# Patient Record
Sex: Female | Born: 1950
Health system: Southern US, Community
[De-identification: ages and names within clinical notes are randomized; demographics above are authoritative.]

## PROBLEM LIST (undated history)

## (undated) DIAGNOSIS — K5790 Diverticulosis of intestine, part unspecified, without perforation or abscess without bleeding: Secondary | ICD-10-CM

## (undated) DIAGNOSIS — T7840XA Allergy, unspecified, initial encounter: Secondary | ICD-10-CM

## (undated) DIAGNOSIS — Z01419 Encounter for gynecological examination (general) (routine) without abnormal findings: Secondary | ICD-10-CM

## (undated) DIAGNOSIS — H5210 Myopia, unspecified eye: Secondary | ICD-10-CM

## (undated) DIAGNOSIS — C801 Malignant (primary) neoplasm, unspecified: Secondary | ICD-10-CM

## (undated) DIAGNOSIS — Z9889 Other specified postprocedural states: Secondary | ICD-10-CM

## (undated) DIAGNOSIS — E785 Hyperlipidemia, unspecified: Secondary | ICD-10-CM

## (undated) DIAGNOSIS — M810 Age-related osteoporosis without current pathological fracture: Secondary | ICD-10-CM

## (undated) DIAGNOSIS — D649 Anemia, unspecified: Secondary | ICD-10-CM

## (undated) DIAGNOSIS — M1990 Unspecified osteoarthritis, unspecified site: Secondary | ICD-10-CM

## (undated) DIAGNOSIS — A6000 Herpesviral infection of urogenital system, unspecified: Secondary | ICD-10-CM

## (undated) DIAGNOSIS — A925 Zika virus disease: Secondary | ICD-10-CM

## (undated) DIAGNOSIS — G43909 Migraine, unspecified, not intractable, without status migrainosus: Secondary | ICD-10-CM

## (undated) DIAGNOSIS — K219 Gastro-esophageal reflux disease without esophagitis: Secondary | ICD-10-CM

## (undated) DIAGNOSIS — Z1509 Genetic susceptibility to other malignant neoplasm: Secondary | ICD-10-CM

## (undated) DIAGNOSIS — Z85820 Personal history of malignant melanoma of skin: Secondary | ICD-10-CM

## (undated) DIAGNOSIS — I10 Essential (primary) hypertension: Secondary | ICD-10-CM

## (undated) DIAGNOSIS — G4700 Insomnia, unspecified: Secondary | ICD-10-CM

## (undated) DIAGNOSIS — F329 Major depressive disorder, single episode, unspecified: Secondary | ICD-10-CM

## (undated) DIAGNOSIS — S0590XA Unspecified injury of unspecified eye and orbit, initial encounter: Secondary | ICD-10-CM

## (undated) DIAGNOSIS — Z9289 Personal history of other medical treatment: Secondary | ICD-10-CM

## (undated) DIAGNOSIS — F419 Anxiety disorder, unspecified: Secondary | ICD-10-CM

## (undated) DIAGNOSIS — D126 Benign neoplasm of colon, unspecified: Secondary | ICD-10-CM

## (undated) DIAGNOSIS — R112 Nausea with vomiting, unspecified: Secondary | ICD-10-CM

## (undated) DIAGNOSIS — I700 Atherosclerosis of aorta: Secondary | ICD-10-CM

## (undated) DIAGNOSIS — F32A Depression, unspecified: Secondary | ICD-10-CM

## (undated) HISTORY — DX: Essential (primary) hypertension: I10

## (undated) HISTORY — DX: Major depressive disorder, single episode, unspecified: F32.9

## (undated) HISTORY — PX: VARICOSE VEIN SURGERY: SHX832

## (undated) HISTORY — DX: Benign neoplasm of colon, unspecified: D12.6

## (undated) HISTORY — DX: Depression, unspecified: F32.A

## (undated) HISTORY — PX: TONSILLECTOMY AND ADENOIDECTOMY: SUR1326

## (undated) HISTORY — DX: Migraine, unspecified, not intractable, without status migrainosus: G43.909

## (undated) HISTORY — DX: Herpesviral infection of urogenital system, unspecified: A60.00

## (undated) HISTORY — DX: Age-related osteoporosis without current pathological fracture: M81.0

## (undated) HISTORY — DX: Personal history of other medical treatment: Z92.89

## (undated) HISTORY — DX: Malignant (primary) neoplasm, unspecified: C80.1

## (undated) HISTORY — DX: Hyperlipidemia, unspecified: E78.5

## (undated) HISTORY — PX: POLYPECTOMY: SHX149

## (undated) HISTORY — DX: Anxiety disorder, unspecified: F41.9

## (undated) HISTORY — DX: Encounter for gynecological examination (general) (routine) without abnormal findings: Z01.419

## (undated) HISTORY — DX: Unspecified injury of unspecified eye and orbit, initial encounter: S05.90XA

## (undated) HISTORY — DX: Anemia, unspecified: D64.9

## (undated) HISTORY — DX: Atherosclerosis of aorta: I70.0

## (undated) HISTORY — DX: Personal history of malignant melanoma of skin: Z85.820

## (undated) HISTORY — DX: Zika virus disease: A92.5

## (undated) HISTORY — DX: Diverticulosis of intestine, part unspecified, without perforation or abscess without bleeding: K57.90

## (undated) HISTORY — DX: Nausea with vomiting, unspecified: R11.2

## (undated) HISTORY — DX: Genetic susceptibility to other malignant neoplasm: Z15.09

## (undated) HISTORY — DX: Myopia, unspecified eye: H52.10

## (undated) HISTORY — DX: Allergy, unspecified, initial encounter: T78.40XA

## (undated) HISTORY — DX: Gastro-esophageal reflux disease without esophagitis: K21.9

## (undated) HISTORY — DX: Insomnia, unspecified: G47.00

## (undated) HISTORY — DX: Other specified postprocedural states: Z98.890

## (undated) HISTORY — DX: Unspecified osteoarthritis, unspecified site: M19.90

---

## 1983-05-12 HISTORY — PX: TUBAL LIGATION: SHX77

## 1996-05-11 HISTORY — PX: VAGINAL HYSTERECTOMY: SUR661

## 1997-10-03 ENCOUNTER — Other Ambulatory Visit: Admission: RE | Admit: 1997-10-03 | Discharge: 1997-10-03 | Payer: Self-pay | Admitting: Obstetrics and Gynecology

## 1998-10-04 ENCOUNTER — Other Ambulatory Visit: Admission: RE | Admit: 1998-10-04 | Discharge: 1998-10-04 | Payer: Self-pay | Admitting: Obstetrics and Gynecology

## 1999-06-21 ENCOUNTER — Emergency Department (HOSPITAL_COMMUNITY): Admission: EM | Admit: 1999-06-21 | Discharge: 1999-06-21 | Payer: Self-pay | Admitting: Emergency Medicine

## 2000-01-13 ENCOUNTER — Other Ambulatory Visit: Admission: RE | Admit: 2000-01-13 | Discharge: 2000-01-13 | Payer: Self-pay | Admitting: Obstetrics and Gynecology

## 2000-03-22 ENCOUNTER — Encounter: Payer: Self-pay | Admitting: Family Medicine

## 2000-03-22 ENCOUNTER — Encounter: Admission: RE | Admit: 2000-03-22 | Discharge: 2000-03-22 | Payer: Self-pay | Admitting: Family Medicine

## 2000-06-04 ENCOUNTER — Emergency Department (HOSPITAL_COMMUNITY): Admission: EM | Admit: 2000-06-04 | Discharge: 2000-06-04 | Payer: Self-pay

## 2000-11-02 ENCOUNTER — Encounter: Payer: Self-pay | Admitting: Family Medicine

## 2001-02-05 ENCOUNTER — Emergency Department (HOSPITAL_COMMUNITY): Admission: EM | Admit: 2001-02-05 | Discharge: 2001-02-05 | Payer: Self-pay | Admitting: Emergency Medicine

## 2001-02-09 ENCOUNTER — Other Ambulatory Visit: Admission: RE | Admit: 2001-02-09 | Discharge: 2001-02-09 | Payer: Self-pay | Admitting: Obstetrics and Gynecology

## 2001-03-22 ENCOUNTER — Encounter: Payer: Self-pay | Admitting: Orthopaedic Surgery

## 2001-03-22 ENCOUNTER — Encounter: Admission: RE | Admit: 2001-03-22 | Discharge: 2001-03-22 | Payer: Self-pay | Admitting: Orthopaedic Surgery

## 2002-03-22 ENCOUNTER — Other Ambulatory Visit: Admission: RE | Admit: 2002-03-22 | Discharge: 2002-03-22 | Payer: Self-pay | Admitting: Obstetrics and Gynecology

## 2003-03-28 ENCOUNTER — Encounter: Payer: Self-pay | Admitting: Family Medicine

## 2003-03-28 ENCOUNTER — Other Ambulatory Visit: Admission: RE | Admit: 2003-03-28 | Discharge: 2003-03-28 | Payer: Self-pay | Admitting: Obstetrics and Gynecology

## 2003-05-24 ENCOUNTER — Encounter: Payer: Self-pay | Admitting: Family Medicine

## 2004-04-22 ENCOUNTER — Other Ambulatory Visit: Admission: RE | Admit: 2004-04-22 | Discharge: 2004-04-22 | Payer: Self-pay | Admitting: Obstetrics and Gynecology

## 2004-10-22 ENCOUNTER — Ambulatory Visit (HOSPITAL_COMMUNITY): Admission: RE | Admit: 2004-10-22 | Discharge: 2004-10-22 | Payer: Self-pay | Admitting: Obstetrics and Gynecology

## 2004-10-22 ENCOUNTER — Encounter: Payer: Self-pay | Admitting: Family Medicine

## 2004-10-31 ENCOUNTER — Encounter: Admission: RE | Admit: 2004-10-31 | Discharge: 2004-10-31 | Payer: Self-pay | Admitting: Family Medicine

## 2005-04-06 ENCOUNTER — Ambulatory Visit: Payer: Self-pay | Admitting: Internal Medicine

## 2005-04-07 ENCOUNTER — Ambulatory Visit: Payer: Self-pay | Admitting: *Deleted

## 2005-04-10 ENCOUNTER — Ambulatory Visit: Payer: Self-pay | Admitting: Internal Medicine

## 2005-04-17 ENCOUNTER — Encounter: Payer: Self-pay | Admitting: Family Medicine

## 2005-04-17 ENCOUNTER — Ambulatory Visit: Payer: Self-pay | Admitting: Internal Medicine

## 2005-05-13 ENCOUNTER — Other Ambulatory Visit: Admission: RE | Admit: 2005-05-13 | Discharge: 2005-05-13 | Payer: Self-pay | Admitting: Obstetrics and Gynecology

## 2005-07-09 ENCOUNTER — Emergency Department (HOSPITAL_COMMUNITY): Admission: EM | Admit: 2005-07-09 | Discharge: 2005-07-09 | Payer: Self-pay | Admitting: Emergency Medicine

## 2006-06-22 ENCOUNTER — Ambulatory Visit: Payer: Self-pay | Admitting: Internal Medicine

## 2006-07-08 ENCOUNTER — Encounter: Payer: Self-pay | Admitting: Family Medicine

## 2006-07-08 ENCOUNTER — Ambulatory Visit: Payer: Self-pay | Admitting: Internal Medicine

## 2007-07-05 ENCOUNTER — Emergency Department (HOSPITAL_COMMUNITY): Admission: EM | Admit: 2007-07-05 | Discharge: 2007-07-05 | Payer: Self-pay | Admitting: Family Medicine

## 2007-07-14 ENCOUNTER — Encounter: Admission: RE | Admit: 2007-07-14 | Discharge: 2007-07-14 | Payer: Self-pay | Admitting: Family Medicine

## 2007-12-13 ENCOUNTER — Telehealth: Payer: Self-pay | Admitting: Internal Medicine

## 2008-08-20 ENCOUNTER — Encounter (INDEPENDENT_AMBULATORY_CARE_PROVIDER_SITE_OTHER): Payer: Self-pay | Admitting: *Deleted

## 2008-08-21 ENCOUNTER — Ambulatory Visit: Payer: Self-pay | Admitting: Family Medicine

## 2008-08-21 DIAGNOSIS — J309 Allergic rhinitis, unspecified: Secondary | ICD-10-CM | POA: Insufficient documentation

## 2008-08-21 DIAGNOSIS — R51 Headache: Secondary | ICD-10-CM | POA: Insufficient documentation

## 2008-08-21 DIAGNOSIS — R519 Headache, unspecified: Secondary | ICD-10-CM | POA: Insufficient documentation

## 2008-08-21 DIAGNOSIS — E785 Hyperlipidemia, unspecified: Secondary | ICD-10-CM

## 2008-08-21 DIAGNOSIS — J45909 Unspecified asthma, uncomplicated: Secondary | ICD-10-CM | POA: Insufficient documentation

## 2008-08-21 DIAGNOSIS — R030 Elevated blood-pressure reading, without diagnosis of hypertension: Secondary | ICD-10-CM | POA: Insufficient documentation

## 2008-08-22 ENCOUNTER — Encounter (INDEPENDENT_AMBULATORY_CARE_PROVIDER_SITE_OTHER): Payer: Self-pay | Admitting: *Deleted

## 2008-09-27 ENCOUNTER — Encounter (INDEPENDENT_AMBULATORY_CARE_PROVIDER_SITE_OTHER): Payer: Self-pay | Admitting: *Deleted

## 2008-10-03 ENCOUNTER — Ambulatory Visit: Payer: Self-pay | Admitting: Family Medicine

## 2008-11-07 ENCOUNTER — Encounter: Payer: Self-pay | Admitting: Family Medicine

## 2009-01-18 ENCOUNTER — Ambulatory Visit: Payer: Self-pay | Admitting: Family Medicine

## 2009-01-18 DIAGNOSIS — J019 Acute sinusitis, unspecified: Secondary | ICD-10-CM

## 2009-03-01 ENCOUNTER — Ambulatory Visit: Payer: Self-pay | Admitting: Family Medicine

## 2009-03-01 DIAGNOSIS — K589 Irritable bowel syndrome without diarrhea: Secondary | ICD-10-CM

## 2009-03-05 ENCOUNTER — Telehealth (INDEPENDENT_AMBULATORY_CARE_PROVIDER_SITE_OTHER): Payer: Self-pay | Admitting: *Deleted

## 2009-03-13 ENCOUNTER — Encounter: Payer: Self-pay | Admitting: Internal Medicine

## 2009-03-13 ENCOUNTER — Encounter (INDEPENDENT_AMBULATORY_CARE_PROVIDER_SITE_OTHER): Payer: Self-pay | Admitting: *Deleted

## 2009-03-13 LAB — CONVERTED CEMR LAB
ALT: 18 units/L
AST: 27 units/L
Alkaline Phosphatase: 61 units/L
CO2, serum: 26 mmol/L
Chloride, Serum: 105 mmol/L
Cholesterol: 246 mg/dL
Glucose, Bld: 82 mg/dL
HCT: 38.1 %
Hemoglobin: 12.6 g/dL
Platelets: 216 10*3/uL
Potassium, serum: 3.7 mmol/L
Total Bilirubin: 0.4 mg/dL

## 2009-04-11 ENCOUNTER — Encounter: Payer: Self-pay | Admitting: Internal Medicine

## 2009-04-22 ENCOUNTER — Encounter: Payer: Self-pay | Admitting: Internal Medicine

## 2009-04-23 ENCOUNTER — Encounter: Payer: Self-pay | Admitting: Internal Medicine

## 2009-04-23 ENCOUNTER — Encounter (INDEPENDENT_AMBULATORY_CARE_PROVIDER_SITE_OTHER): Payer: Self-pay | Admitting: *Deleted

## 2009-04-23 LAB — CONVERTED CEMR LAB
CO2, serum: 26 mmol/L
Chloride, Serum: 107 mmol/L
Creatinine, Ser: 0.74 mg/dL
HCT: 38.5 %
Potassium, serum: 3.9 mmol/L
WBC, blood: 3.26 10*3/uL

## 2009-04-24 ENCOUNTER — Encounter (INDEPENDENT_AMBULATORY_CARE_PROVIDER_SITE_OTHER): Payer: Self-pay | Admitting: *Deleted

## 2009-05-20 ENCOUNTER — Encounter (INDEPENDENT_AMBULATORY_CARE_PROVIDER_SITE_OTHER): Payer: Self-pay | Admitting: *Deleted

## 2009-08-09 ENCOUNTER — Encounter (INDEPENDENT_AMBULATORY_CARE_PROVIDER_SITE_OTHER): Payer: Self-pay | Admitting: *Deleted

## 2009-09-24 ENCOUNTER — Encounter: Payer: Self-pay | Admitting: Family Medicine

## 2009-10-25 ENCOUNTER — Telehealth (INDEPENDENT_AMBULATORY_CARE_PROVIDER_SITE_OTHER): Payer: Self-pay | Admitting: *Deleted

## 2009-11-20 ENCOUNTER — Ambulatory Visit: Payer: Self-pay | Admitting: Family Medicine

## 2010-01-09 ENCOUNTER — Ambulatory Visit (HOSPITAL_COMMUNITY): Admission: RE | Admit: 2010-01-09 | Discharge: 2010-01-09 | Payer: Self-pay | Admitting: Orthopaedic Surgery

## 2010-01-20 ENCOUNTER — Telehealth (INDEPENDENT_AMBULATORY_CARE_PROVIDER_SITE_OTHER): Payer: Self-pay | Admitting: *Deleted

## 2010-02-14 ENCOUNTER — Telehealth (INDEPENDENT_AMBULATORY_CARE_PROVIDER_SITE_OTHER): Payer: Self-pay | Admitting: *Deleted

## 2010-02-19 ENCOUNTER — Ambulatory Visit: Payer: Self-pay | Admitting: Family Medicine

## 2010-02-19 DIAGNOSIS — G4700 Insomnia, unspecified: Secondary | ICD-10-CM

## 2010-02-19 DIAGNOSIS — F411 Generalized anxiety disorder: Secondary | ICD-10-CM | POA: Insufficient documentation

## 2010-04-04 ENCOUNTER — Ambulatory Visit: Payer: Self-pay | Admitting: Family Medicine

## 2010-04-11 ENCOUNTER — Ambulatory Visit (HOSPITAL_COMMUNITY)
Admission: RE | Admit: 2010-04-11 | Discharge: 2010-04-11 | Payer: Self-pay | Source: Home / Self Care | Admitting: Orthopaedic Surgery

## 2010-04-18 ENCOUNTER — Telehealth (INDEPENDENT_AMBULATORY_CARE_PROVIDER_SITE_OTHER): Payer: Self-pay | Admitting: *Deleted

## 2010-04-21 ENCOUNTER — Encounter: Payer: Self-pay | Admitting: Family Medicine

## 2010-05-11 HISTORY — PX: ROTATOR CUFF REPAIR: SHX139

## 2010-06-08 LAB — CONVERTED CEMR LAB
Albumin: 3.8 g/dL (ref 3.5–5.2)
Alkaline Phosphatase: 56 units/L (ref 39–117)
BUN: 12 mg/dL (ref 6–23)
Basophils Relative: 0.9 % (ref 0.0–3.0)
Bilirubin, Direct: 0.1 mg/dL (ref 0.0–0.3)
CO2: 28 meq/L (ref 19–32)
Chloride: 110 meq/L (ref 96–112)
Creatinine, Ser: 0.8 mg/dL (ref 0.4–1.2)
Eosinophils Absolute: 0 10*3/uL (ref 0.0–0.7)
Eosinophils Relative: 1.6 % (ref 0.0–5.0)
HCT: 34.4 % — ABNORMAL LOW (ref 36.0–46.0)
HDL: 56.8 mg/dL (ref >39.00)
Lymphs Abs: 1 10*3/uL (ref 0.7–4.0)
MCHC: 35.3 g/dL (ref 30.0–36.0)
MCV: 92.4 fL (ref 78.0–100.0)
Monocytes Absolute: 0.2 10*3/uL (ref 0.1–1.0)
Neutrophils Relative %: 50 % (ref 43.0–77.0)
Potassium: 3.7 meq/L (ref 3.5–5.1)
RBC: 3.73 M/uL — ABNORMAL LOW (ref 3.87–5.11)
Total CHOL/HDL Ratio: 4
Total Protein: 6.3 g/dL (ref 6.0–8.3)
VLDL: 14.6 mg/dL (ref 0.0–40.0)
WBC: 2.7 10*3/uL — ABNORMAL LOW (ref 4.5–10.5)

## 2010-06-12 NOTE — Progress Notes (Signed)
Summary: Wants sleep meds  Phone Note Call from Patient   Caller: Patient Call For: Neena Rhymes MD Details for Reason: Wants sleep Aid Summary of Call: spk with pt and she requested to have Ambien called into the pharmacy, adv she will need and appt, per pt unable to come in until next week due to daughter's wedding, scheduled appt 02/19/10 @ 2:40 with Dr.Tabori. Pt agreed to appt. call ended. Almeta Monas CMA Duncan Dull)  February 14, 2010 11:40 AM  Initial call taken by: Almeta Monas CMA Duncan Dull),  February 14, 2010 11:41 AM

## 2010-06-12 NOTE — Letter (Signed)
Summary: Colonoscopy Letter  [facility]  [address] [place], [place] [address]   Phone: [phone]  Fax: [phone]      August 09, 2009 MRN: [id number]   [facility] [address] [place], [place]  [address]   Dear Ms. Bergen,   According to your medical record, it is time for you to schedule a Colonoscopy. The American Cancer Society recommends this procedure as a method to detect early colon cancer. Patients with a family history of colon cancer, or a personal history of colon polyps or inflammatory bowel disease are at increased risk.  This letter has beeen generated based on the recommendations made at the time of your procedure. If you feel that in your particular situation this may no longer apply, please contact our office.  Please call our office at [phone] to schedule this appointment or to update your records at your earliest convenience.  Thank you for cooperating with [place] to provide you with the very best care possible.   Sincerely,  Iva Boop, M.D.  [facility] [phone]

## 2010-06-12 NOTE — Letter (Signed)
Summary: [facility]  [facility]   Imported By: Lanelle Bal 10/16/2009 11:10:26  _____________________________________________________________________  External Attachment:    Type:   Image     Comment:   External Document

## 2010-06-12 NOTE — Progress Notes (Signed)
Summary: acyclovir refill   Phone Note Refill Request Call back at [phone] Message from:  Pharmacy on October 25, 2009 10:49 AM  Refills Requested: Medication #1:  ACYCLOVIR 400 MG TAB #21 TAKE ONE TABLET THREE TIMES DAILY FOR SEVEN DAYS   Dosage confirmed as above?Dosage Confirmed   Supply Requested: 1 month [facility] PATIENT IS REQUESTING FOR THIS FROM DR. TABORI. ORIGINAL RX WAS FROM DR BLOMGREN-SHE HAS A BAD COLD SORE.  Next Appointment Scheduled: NONE Initial call taken by: Lavell Islam,  October 25, 2009 10:51 AM    New/Updated Medications: ACYCLOVIR 400 MG TABS (ACYCLOVIR) take one tablet three times a day as needed for outbreaks Prescriptions: ACYCLOVIR 400 MG TABS (ACYCLOVIR) take one tablet three times a day as needed for outbreaks  #30 x 1   Entered by:   Doristine Devoid   Authorized by:   Neena Rhymes MD   Signed by:   Doristine Devoid on 10/25/2009   Method used:   Electronically to        [facility]* (retail)       [address]       [place], [place]  [id number]       Ph: [phone] or [phone]       Fax: [phone]   RxID:   [id number]

## 2010-06-12 NOTE — Miscellaneous (Signed)
Summary: colonoscopy results-- repeat in 2 years   scanned into emr [facility]  June 18, 2009 4:46 PM  Clinical Lists Changes  Observations: Added new observation of COLONRECACT: Repeat colonoscopy in 2 years.   (04/11/2009 16:45) Added new observation of COLONOSCOPY: Results: Polyp.  Results: Diverticulosis.       Location:  [facility].  Dr. Kinnie Scales  (04/11/2009 16:45)      Colonoscopy  Procedure date:  04/11/2009  Findings:      Results: Polyp.  Results: Diverticulosis.       Location:  [facility].  Dr. Kinnie Scales   Comments:      Repeat colonoscopy in 2 years.      Colonoscopy  Procedure date:  04/11/2009  Findings:      Results: Polyp.  Results: Diverticulosis.       Location:  [facility].  Dr. Kinnie Scales   Comments:      Repeat colonoscopy in 2 years.

## 2010-06-12 NOTE — Assessment & Plan Note (Signed)
Summary: Difficulty sleeping/KP   Vital Signs:  Patient profile:   60 year old female Height:      67 inches Weight:      163 pounds BMI:     25.62 Pulse rate:   76 / minute BP sitting:   110 / 80  (left arm)  Vitals Entered By: Doristine Devoid CMA (February 19, 2010 4:15 PM)  History of Present Illness: 60 yo woman here today for insomnia.  'will sleep ok from 11-3 and then i'm up'.  exercising regularly.  no difficulty falling asleep, trouble staying asleep.  sxs started a 'couple of months ago'.  denies recent stressors.  admits to increased anxiety- 'i don't like to be around people'.  withdrawing from activities outside of work- 'i just want to be by myself'  strained relationship w/ daughter.  'don't talk to me about prozac or anything like that'.  has tried OTC sleep aids, Tylenol or Advil PM- w/ some relief.    Allergies (verified): 1)  ! 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 Alert and cooperative with normal attention span and concentration. No apparent delusions, illusions, hallucinations   Impression & Recommendations:  Problem # 1:  INSOMNIA-SLEEP DISORDER-UNSPEC (ICD-780.52) Assessment New most likely related to pt's anxiety.  will start Citalopram for anxiety and Ambien to bridge the gap until SSRI takes effect.  will follow closely.  total time spent w/ pt 29 minutes, >50% spent counseling. Her updated medication list for this problem includes:    Zolpidem Tartrate 5 Mg Tabs (Zolpidem tartrate) .Marland Kitchen... 1-2 tabs at bedtime as needed for sleep  Problem # 2:  ANXIETY STATE, UNSPECIFIED (ICD-300.00) Assessment: New see above. Her updated medication list for this problem includes:    Citalopram  Hydrobromide 20 Mg Tabs (Citalopram hydrobromide) .Marland Kitchen... Take one tablet by mouth daily  Complete Medication List: 1)  Zyrtec Allergy 10 Mg Tabs (Cetirizine hcl) .... Take one tablet daily 2)  Estrace  .... Use daily 3)  Multivitamins Caps (Multiple vitamin) .... Take one tablet daily 4)  Acyclovir 400 Mg Tabs (Acyclovir) .... Take one tablet three times a day as needed for outbreaks 5)  Verapamil Hcl Cr 240 Mg Cr-tabs (Verapamil hcl) .... Take one tablet daily 6)  Citalopram Hydrobromide 20 Mg Tabs (Citalopram hydrobromide) .... Take one tablet by mouth daily 7)  Zolpidem Tartrate 5 Mg Tabs (Zolpidem tartrate) .Marland Kitchen.. 1-2 tabs at bedtime as needed for sleep  Patient Instructions: 1)  Please schedule a follow-up appointment in 1 month to recheck meds. 2)  Start the Citalopram daily as directed 3)  Use the Ambien (Zolpidem) 3-4x/week as needed for sleep 4)  Hang in there!!  Prescriptions: ZOLPIDEM TARTRATE 5 MG TABS (ZOLPIDEM TARTRATE) 1-2 tabs at bedtime as needed for sleep  #30 x 1   Entered and Authorized by:   Neena Rhymes MD   Signed by:   Neena Rhymes MD on 02/19/2010   Method used:   Print then Give to Patient   RxID:   [id number] CITALOPRAM HYDROBROMIDE 20 MG TABS (CITALOPRAM HYDROBROMIDE) take one tablet by mouth daily  #30 x 3   Entered and Authorized by:   Neena Rhymes MD   Signed by:   Neena Rhymes MD on 02/19/2010  Method used:   Electronically to        [facility]* (retail)       [address]       [place], [place]  [id number]       Ph: [phone] or [phone]       Fax: [phone]   RxID:   [id number]

## 2010-06-12 NOTE — Letter (Signed)
Summary: [facility]  [facility]   Imported By: Lanelle Bal 10/08/2009 13:23:51  _____________________________________________________________________  External Attachment:    Type:   Image     Comment:   External Document

## 2010-06-12 NOTE — Assessment & Plan Note (Signed)
Summary: sinus problems//kn   Vital Signs:  Patient profile:   60 year old female Weight:      161 pounds Temp:     97.9 degrees F oral BP sitting:   130 / 80  (left arm)  Vitals Entered By: Doristine Devoid CMA (November 20, 2009 9:11 AM) CC: sinus congestion and cough x2 weeks    History of Present Illness: 59 yo woman here today for sinus congestion and cough.  sxs started 2 weeks ago.  + HAs.  cough is wet but nonproductive.  + ear pain, R>L.  mild facial pain.  no fevers.  + sick contacts at work.  elevated BP.  started on Verapamil by neurologist.  neuro asked for EKG.  Problems Prior to Update: 1)  Encounter For Long-term Use of Other Medications  (ICD-V58.69) 2)  Physical Examination  (ICD-V70.0) 3)  Irritable Bowel Syndrome  (ICD-564.1) 4)  Sinusitis - Acute-nos  (ICD-461.9) 5)  Hyperlipidemia  (ICD-272.4) 6)  Elevated Bp w/o Hypertension  (ICD-796.2) 7)  Rhinitis  (ICD-477.9) 8)  Headache, Chronic  (ICD-784.0) 9)  Asthma  (ICD-493.90)  Current Medications (verified): 1)  Zyrtec Allergy 10 Mg Tabs (Cetirizine Hcl) .... Take One Tablet Daily 2)  Estrace .... Use Daily 3)  Multivitamins  Caps (Multiple Vitamin) .... Take One Tablet Daily 4)  Acyclovir 400 Mg Tabs (Acyclovir) .... Take One Tablet Three Times A Day As Needed For Outbreaks 5)  Verapamil Hcl Cr 240 Mg Cr-Tabs (Verapamil Hcl) .... Take One Tablet Daily 6)  Amoxicillin 500 Mg Tabs (Amoxicillin) .... 2 Tabs By Mouth Two Times A Day X10 Days  Allergies (verified): 1)  ! Sulfa  Review of Systems      See HPI  Physical Exam  General:  Well-developed,well-nourished,in no acute distress; alert,appropriate and cooperative throughout examination Head:  Normocephalic and atraumatic without obvious abnormalities. No apparent alopecia or balding.  mild TTP over sinuses Eyes:  no injxn or inflammation Ears:  External ear exam shows no significant lesions or deformities.  Otoscopic examination reveals clear canals,  tympanic membranes are intact bilaterally without bulging, retraction, inflammation or discharge. Hearing is grossly normal bilaterally. Nose:  + congestion Mouth:  + PND, otherwise WNL Neck:  No deformities, masses, or tenderness noted. Lungs:  Normal respiratory effort, chest expands symmetrically. Lungs are clear to auscultation, no crackles or wheezes.  + wet, hacking cough Heart:  Normal rate and regular rhythm. S1 and S2 normal without gallop, murmur, click, rub or other extra sounds.   Impression & Recommendations:  Problem # 1:  SINUSITIS - ACUTE-NOS (ICD-461.9) Assessment Unchanged pt's sxs and PE consistent w/ sinusitis/bronchitis combo.  start high dose amox to penetrate sinuses.  reviewed supportive care and red flags that should prompt return.  Pt expresses understanding and is in agreement w/ this plan. The following medications were removed from the medication list:    Fluticasone Propionate 50 Mcg/act Susp (Fluticasone propionate) .Marland Kitchen... 2 sprays each nostril once daily Her updated medication list for this problem includes:    Amoxicillin 500 Mg Tabs (Amoxicillin) .Marland Kitchen... 2 tabs by mouth two times a day x10 days  Problem # 2:  ELEVATED BP W/O HYPERTENSION (ICD-796.2) Assessment: Unchanged pt recently started on Verapamil by neurology for elevated BP.  requesting EKG.  done- nothing acute.  will monitor BP and adjust meds as needed. Her updated medication list for this problem includes:    Verapamil Hcl Cr 240 Mg Cr-tabs (Verapamil hcl) .Marland Kitchen... Take one tablet daily  Complete Medication List: 1)  Zyrtec Allergy 10 Mg Tabs (Cetirizine hcl) .... Take one tablet daily 2)  Estrace  .... Use daily 3)  Multivitamins Caps (Multiple vitamin) .... Take one tablet daily 4)  Acyclovir 400 Mg Tabs (Acyclovir) .... Take one tablet three times a day as needed for outbreaks 5)  Verapamil Hcl Cr 240 Mg Cr-tabs (Verapamil hcl) .... Take one tablet daily 6)  Amoxicillin 500 Mg Tabs  (Amoxicillin) .... 2 tabs by mouth two times a day x10 days  Other Orders: EKG w/ Interpretation (93000)  Patient Instructions: 1)  Follow up in 1 month to recheck BP 2)  Start the Amoxicillin as directed- take w/ food to avoid upset stomach 3)  Start the Mucinex to thin your congestion 4)  Tylenol/Ibuprofen as needed for fever 5)  Call with any questions or concerns 6)  Enjoy your grandbaby!!! Prescriptions: AMOXICILLIN 500 MG TABS (AMOXICILLIN) 2 tabs by mouth two times a day x10 days  #40 x 0   Entered and Authorized by:   Neena Rhymes MD   Signed by:   Neena Rhymes MD on 11/20/2009   Method used:   Electronically to        [facility] (retail)       [address]       [place], [place]  [address]       Ph: [phone] or [phone]       Fax: [phone]   RxID:   [id number]

## 2010-06-12 NOTE — Letter (Signed)
Summary: [facility]  [facility]   Imported By: Lanelle Bal 05/02/2010 12:48:30  _____________________________________________________________________  External Attachment:    Type:   Image     Comment:   External Document

## 2010-06-12 NOTE — Procedures (Signed)
Summary: Colonoscopy/[facility]  Colonoscopy/[facility]   Imported By: Lanelle Bal 06/20/2009 15:57:17  _____________________________________________________________________  External Attachment:    Type:   Image     Comment:   External Document

## 2010-06-12 NOTE — Progress Notes (Signed)
Summary: zolpidem refill   Phone Note Refill Request Call back at [phone] Message from:  Pharmacy on April 18, 2010 7:48 AM  Refills Requested: Medication #1:  ZOLPIDEM TARTRATE 5 MG TABS 1-2 tabs at bedtime as needed for sleep.   Dosage confirmed as above?Dosage Confirmed   Supply Requested: 1 month   [facility] [address]   Next Appointment Scheduled: none Initial call taken by: Harold Barban,  April 18, 2010 7:48 AM    Prescriptions: ZOLPIDEM TARTRATE 5 MG TABS (ZOLPIDEM TARTRATE) 1-2 tabs at bedtime as needed for sleep  #30 x 1   Entered by:   Doristine Devoid CMA   Authorized by:   Neena Rhymes MD   Signed by:   Doristine Devoid CMA on 04/18/2010   Method used:   Printed then faxed to ...       [facility] [address] [phone]* (retail)       [address]       [place], [place]  [address]       Ph: [phone] or [phone]       Fax: [phone]   RxID:   [id number]

## 2010-06-12 NOTE — Miscellaneous (Signed)
  Clinical Lists Changes  Observations: Added new observation of ALBUMIN: 4.6 g/dL (62/13/0865 78:46) Added new observation of CALCIUM: 9.7 mg/dL (96/29/5284 13:24) Added new observation of GLUCOSE SER: 90 mg/dL (40/02/2724 36:64) Added new observation of CREATININE: 0.74 mg/dL (40/34/7425 95:63) Added new observation of BUN: 20 mg/dL (87/56/4332 95:18) Added new observation of CO2 TOTAL: 26 mmol/L (04/23/2009 10:19) Added new observation of CHLORIDE: 107 mmol/L (04/23/2009 10:19) Added new observation of POTASSIUM: 3.9 mmol/L (04/23/2009 10:19) Added new observation of SODIUM: 144 mmol/L (04/23/2009 10:19) Added new observation of PLATELETS: 177 10*3/mm3 (04/23/2009 10:19) Added new observation of HCT: 38.5 % (04/23/2009 10:19) Added new observation of HGB: 12.9 g/dL (84/16/6063 01:60) Added new observation of RBC: 4.29 10*6/mm3 (04/23/2009 10:19) Added new observation of WBC: 3.26 10*3/mm3 (04/23/2009 10:19)

## 2010-06-12 NOTE — Progress Notes (Signed)
Summary: CHANGE IN GI MD.  Phone Note Outgoing Call Call back at Home Phone [phone]   Call placed by: Harlow Mares CMA Duncan Dull),  January 20, 2010 11:30 AM Call placed to: Patient Summary of Call: called to remind patient about her colonoscopy but now see in her cahrt thatshe has changed to Dr. Kinnie Scales, we will note the chage in practices in IDX Initial call taken by: Harlow Mares CMA Duncan Dull),  January 20, 2010 11:38 AM

## 2010-07-02 ENCOUNTER — Institutional Professional Consult (permissible substitution): Payer: Self-pay | Admitting: Family Medicine

## 2010-07-03 ENCOUNTER — Institutional Professional Consult (permissible substitution) (INDEPENDENT_AMBULATORY_CARE_PROVIDER_SITE_OTHER): Payer: BC Managed Care – PPO | Admitting: Family Medicine

## 2010-07-03 DIAGNOSIS — F411 Generalized anxiety disorder: Secondary | ICD-10-CM

## 2010-07-03 DIAGNOSIS — G4700 Insomnia, unspecified: Secondary | ICD-10-CM

## 2010-08-22 ENCOUNTER — Ambulatory Visit (INDEPENDENT_AMBULATORY_CARE_PROVIDER_SITE_OTHER): Payer: BC Managed Care – PPO | Admitting: Family Medicine

## 2010-08-22 DIAGNOSIS — J209 Acute bronchitis, unspecified: Secondary | ICD-10-CM

## 2010-08-25 ENCOUNTER — Other Ambulatory Visit: Payer: Self-pay | Admitting: Family Medicine

## 2010-08-25 ENCOUNTER — Ambulatory Visit
Admission: RE | Admit: 2010-08-25 | Discharge: 2010-08-25 | Disposition: A | Discharge status: Home / Self Care | Payer: BC Managed Care – PPO | Source: Ambulatory Visit | Attending: Family Medicine | Admitting: Family Medicine

## 2010-08-25 ENCOUNTER — Ambulatory Visit (INDEPENDENT_AMBULATORY_CARE_PROVIDER_SITE_OTHER): Payer: BC Managed Care – PPO | Admitting: Family Medicine

## 2010-08-25 DIAGNOSIS — R05 Cough: Secondary | ICD-10-CM

## 2010-08-25 DIAGNOSIS — J209 Acute bronchitis, unspecified: Secondary | ICD-10-CM

## 2010-08-25 DIAGNOSIS — R5383 Other fatigue: Secondary | ICD-10-CM

## 2010-10-07 ENCOUNTER — Telehealth: Payer: Self-pay | Admitting: Family Medicine

## 2010-10-07 NOTE — Telephone Encounter (Signed)
Pt. Chart is on Dr. Delford Field desk and will inform Dr. Lynelle Doctor of this in the morning.  CM, LPN

## 2010-10-08 ENCOUNTER — Encounter: Payer: Self-pay | Admitting: Family Medicine

## 2011-01-05 ENCOUNTER — Other Ambulatory Visit: Payer: Self-pay | Admitting: Family Medicine

## 2011-01-05 MED ORDER — ZOLPIDEM TARTRATE ER 6.25 MG PO TBCR: 6.2500 mg | EXTENDED_RELEASE_TABLET | Freq: Every evening | ORAL | Status: DC | PRN

## 2011-01-05 NOTE — Telephone Encounter (Signed)
Called in Ambien CR 6.25 #30qhs-prn 2 RF's per Dr.Knapp to [facility] @ [phone].

## 2011-01-05 NOTE — Telephone Encounter (Signed)
Please phone in refill.  Thanks.

## 2011-01-13 ENCOUNTER — Encounter: Payer: Self-pay | Admitting: Family Medicine

## 2011-01-16 ENCOUNTER — Encounter: Payer: Self-pay | Admitting: Medical

## 2011-01-16 ENCOUNTER — Other Ambulatory Visit: Payer: Self-pay

## 2011-01-16 ENCOUNTER — Ambulatory Visit (INDEPENDENT_AMBULATORY_CARE_PROVIDER_SITE_OTHER): Payer: BC Managed Care – PPO | Admitting: Medical

## 2011-01-16 DIAGNOSIS — R000 Tachycardia, unspecified: Secondary | ICD-10-CM

## 2011-01-16 DIAGNOSIS — F419 Anxiety disorder, unspecified: Secondary | ICD-10-CM

## 2011-01-16 DIAGNOSIS — R0989 Other specified symptoms and signs involving the circulatory and respiratory systems: Secondary | ICD-10-CM

## 2011-01-16 DIAGNOSIS — Z0000 Encounter for general adult medical examination without abnormal findings: Secondary | ICD-10-CM

## 2011-01-16 DIAGNOSIS — F411 Generalized anxiety disorder: Secondary | ICD-10-CM

## 2011-01-16 DIAGNOSIS — J45909 Unspecified asthma, uncomplicated: Secondary | ICD-10-CM

## 2011-01-16 DIAGNOSIS — Z23 Encounter for immunization: Secondary | ICD-10-CM

## 2011-01-16 LAB — POCT URINALYSIS DIPSTICK
Blood, UA: NEGATIVE
Ketones, UA: NEGATIVE
Spec Grav, UA: 1.015
pH, UA: 6

## 2011-01-16 MED ORDER — ACETAMINOPHEN 500 MG PO TABS: ORAL_TABLET | ORAL | Status: DC

## 2011-01-16 MED ORDER — ZOLPIDEM TARTRATE ER 6.25 MG PO TBCR: 6.2500 mg | EXTENDED_RELEASE_TABLET | Freq: Every evening | ORAL | Status: DC | PRN

## 2011-01-16 MED ORDER — ALBUTEROL SULFATE (2.5 MG/3ML) 0.083% IN NEBU: 2.5000 mg | INHALATION_SOLUTION | Freq: Four times a day (QID) | RESPIRATORY_TRACT | Status: DC | PRN

## 2011-01-16 MED ORDER — CETIRIZINE HCL 10 MG PO TABS: 10.0000 mg | ORAL_TABLET | Freq: Every day | ORAL | Status: DC

## 2011-01-16 MED ORDER — ACIDOPHILUS PROBIOTIC BLEND PO CAPS: 1.0000 | ORAL_CAPSULE | Freq: Three times a day (TID) | ORAL | Status: DC

## 2011-01-16 MED ORDER — AZITHROMYCIN 500 MG PO TABS: 500.0000 mg | ORAL_TABLET | Freq: Every day | ORAL | Status: AC

## 2011-01-16 NOTE — Progress Notes (Signed)
Subjective:   HPI  Shelby Randolph is a 60 y.o. female who presents for a complete physical.    She sees gynecologists Dr. Gaye Alken, and has mammogram scheduled 9/12.  She sees Dr. Vela Prose for migraines, is on verapamil for prophylaxis, and he wants her to have EKG today for surveillance.  She notes hx/o osteopenia, last bone density 2008 normal.  Here for med refills.    Last Tdap unknown.  She will have flu shot 02/03/11 at work.  She notes hx/o bad asthma as a child.  She currently exercises regularly, but can't seem to run or swim due to dyspnea.  However, walking, kickboxing, and Yoga is fine without dyspnea.  Uses albuterol occasionally.    She notes problems with anxiety.  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  She is involved in reading charts, billing/insurance.  She notes that she doesn't socialize much.  She lives alone, has 5 kids, has a dog.   Daughter lives down the street.  But she doesn't socialize with others regularly.  Declines counseling.  Was on Effexor prior to help with mood, but doesn't want to take medications currently.    Reviewed their medical, surgical, family, social, medication, and allergy history and updated chart as appropriate.  Past Medical History  Diagnosis Date  . Insomnia   . Anxiety   . Migraines     Dr. Vela Prose, migraines  . Allergy   . Asthma   . Nearsightedness     wears glasses  . Eye injury     in college, some pertinent loss of vision in left eye    Past Surgical History  Procedure Date  . Rotator cuff repair 05/2010    RIGHT  . Tonsillectomy   . Abdominal hysterectomy 1998    total; abnormal peroids  . Polypectomy     colon, precancerous  . Colonoscopy 2010    Dr. Kinnie Scales  . Varicose vein surgery     bilat    History reviewed. No pertinent family history.  History   Social History  . Marital Status: Widowed    Spouse Name: N/A    Number of  Children: N/A  . Years of Education: N/A   Occupational History  . Not on file.   Social History Main Topics  . Smoking status: Never Smoker   . Smokeless tobacco: Never Used  . Alcohol Use: 0.6 oz/week    1 Glasses of wine per week  . Drug Use: No  . Sexually Active: Not on file     exercise - walks, [place] cardio at gym 3 x/wk, yoga, widowed    Other Topics Concern  . Not on file   Social History Narrative  . No narrative on file    No current outpatient prescriptions on file prior to visit.    Allergies  Allergen Reactions  . Sulfonamide Derivatives     Review of Systems Constitutional: denies fever, chills, sweats, unexpected weight change, anorexia, fatigue Allergy: negative; denies recent sneezing, itching, congestion Dermatology: +some lesions on face, right eye, and chest; denies lumps ENT: no runny nose, ear pain, sore throat, hoarseness, sinus pain, teeth pain, tinnitus, hearing loss, epistaxis Cardiology: denies chest pain, palpitations, edema, orthopnea, paroxysmal nocturnal dyspnea Respiratory: +cough, DOE, no hemoptysis Gastroenterology: +occasional abdominal pain, sensitive stomach, nausea, occasional diarrhea; denies vomiting, constipation, blood in stool, changes in bowel movement, dysphagia Hematology:  denies bleeding or bruising problems Musculoskeletal: denies arthralgias, myalgias, joint swelling, back pain, neck pain, cramping, gait changes Ophthalmology: denies vision changes, eye redness, itching, discharge Urology: denies dysuria, difficulty urinating, hematuria, urinary frequency, urgency, incontinence Neurology: no weakness, tingling, numbness, speech abnormality, memory loss, falls, dizziness      Objective:   Physical Exam  Filed Vitals:   01/16/11 0848  BP: 130/90  Pulse: 72  Temp: 97.6 F (36.4 C)  Resp: 16    General appearance: alert, no distress, WD/WN, white female, looks stated age Skin: right superior  lateral orbit with 2mm raised lesion, uniform color, scattered lesions throughout, some appear to be benign solar keratosis, some appear to be seborrheic keratosis, and a few on chest possibly actinic keratosis HEENT: normocephalic, conjunctiva/corneas normal, sclerae anicteric, PERRLA, EOMi, nares patent, no discharge or erythema, pharynx normal Oral cavity: MMM, tongue normal, teeth normal Neck: supple, no lymphadenopathy, no thyromegaly, no masses, normal ROM, no bruits Chest: non tender, normal shape and expansion Heart: RRR, normal S1, S2, no murmurs Lungs: CTA bilaterally, no wheezes, rhonchi, or rales Abdomen: +bs, soft, non tender, non distended, no masses, no hepatomegaly, no splenomegaly, no bruits Back: non tender, normal ROM, no scoliosis Musculoskeletal: upper extremities non tender, no obvious deformity, normal ROM throughout, lower extremities non tender, no obvious deformity, normal ROM throughout Extremities: no edema, no cyanosis, no clubbing Pulses: 2+ symmetric, upper and lower extremities, normal cap refill Neurological: alert, oriented x 3, CN2-12 intact, strength normal upper extremities and lower extremities, sensation normal throughout, DTRs 2+ throughout, no cerebellar signs, gait normal Psychiatric: normal affect, behavior normal, pleasant  Breast/ GU: deferred to gynecology   Assessment :    Encounter Diagnoses  Name Primary?  . General medical examination Yes  . Need for tetanus booster   . Tachycardia, unspecified   . Asthma   . Dyspnea on exertion   . Anxiety       Plan:    Physical exam - discussed healthy lifestyle, diet, exercise, preventative care, vaccinations, and addressed their concerns.  F/u with gynecology and mammogram soon.  She will see dermatology in November for multiple skin concerns.  She will check insurer about shingles vaccine coverage.    Tdap updated today, VIS given.  She will have flu shot through employer soon.  Tachycardia  per pt report occasionally with Verapamil - EKG today with rate 60 bpm, nsr, normal intervals, - 34 degree axis, but no acute change, no worrisome ST changes.  No prior to compare.  Copy to Dr. Vela Prose.   Asthma, dyspnea on exertion - given the fact that she exercises regularly but has some intolerance to running and swimming, I suspect underlying lung issue.  CXR today with bronchitis changes, otherwise no cardiomegaly, no pneumonia or mass, we will send xray for over read.  We will set up spirometry. Will treat for bronchitis as well.    Anxiety - discussed ways to get more socially involved - thorough work, hobbies, volunteering.  She declines medications or counseling.

## 2011-01-16 NOTE — Patient Instructions (Signed)
Follow up with dermatologist and gynecologist as planned.  We will send you for spirometry testing for the lungs.  Take your albuterol inhaler twice daily for the next 2 weeks to see if this helps your exercise tolerance.  Begin Zpak antibiotic x 3 days.  Preventative Care for Adults - Female      MAINTAIN REGULAR HEALTH EXAMS:  A routine yearly physical is a good way to check in with your primary care provider about your health and preventive screening. It is also an opportunity to share updates about your health and any concerns you have, and receive a thorough all-over exam.   Most health insurance companies pay for at least some preventative services.  Check with your health plan for specific coverages.  WHAT PREVENTATIVE SERVICES DO WOMEN NEED?  Adult women should have their weight and blood pressure checked regularly.   Women age 56 and older should have their cholesterol levels checked regularly.  Women should be screened for cervical cancer with a Pap smear and pelvic exam beginning at either age 51, or 3 years after they become sexually activity.    Breast cancer screening generally begins at age 16 with a mammogram and breast exam by your primary care provider.    Beginning at age 38 and continuing to age 55, women should be screened for colorectal cancer.  Certain people may need continued testing until age 73.  Updating vaccinations is part of preventative care.  Vaccinations help protect against diseases such as the flu.  Osteoporosis is a disease in which the bones lose minerals and strength as we age. Women ages 29 and over should discuss this with their caregivers, as should women after menopause who have other risk factors.  Lab tests are generally done as part of preventative care to screen for anemia and blood disorders, to screen for problems with the kidneys and liver, to screen for bladder problems, to check blood sugar, and to check your cholesterol  level.  Preventative services generally include counseling about diet, exercise, avoiding tobacco, drugs, excessive alcohol consumption, and sexually transmitted infections.    GENERAL RECOMMENDATIONS FOR GOOD HEALTH:  Healthy diet:  Eat a variety of foods, including fruit, vegetables, animal or vegetable protein, such as meat, fish, chicken, and eggs, or beans, lentils, tofu, and grains, such as rice.  Drink plenty of water daily.  Decrease saturated fat in the diet, avoid lots of red meat, processed foods, sweets, fast foods, and fried foods.  Exercise:  Aerobic exercise helps maintain good heart health. At least 30-40 minutes of moderate-intensity exercise is recommended. For example, a brisk walk that increases your heart rate and breathing. This should be done on most days of the week.   Find a type of exercise or a variety of exercises that you enjoy so that it becomes a part of your daily life.  Examples are running, walking, swimming, water aerobics, and biking.  For motivation and support, explore group exercise such as aerobic class, spin class, Zumba, Yoga,or  martial arts, etc.    Set exercise goals for yourself, such as a certain weight goal, walk or run in a race such as a 5k walk/run.  Speak to your primary care provider about exercise goals.  Disease prevention:  If you smoke or chew tobacco, find out from your caregiver how to quit. It can literally save your life, no matter how long you have been a tobacco user. If you do not use tobacco, never begin.   Maintain  a healthy diet and normal weight. Increased weight leads to problems with blood pressure and diabetes.   The Body Mass Index or BMI is a way of measuring how much of your body is fat. Having a BMI above 27 increases the risk of heart disease, diabetes, hypertension, stroke and other problems related to obesity. Your caregiver can help determine your BMI and based on it develop an exercise and dietary program to  help you achieve or maintain this important measurement at a healthful level.  High blood pressure causes heart and blood vessel problems.  Persistent high blood pressure should be treated with medicine if weight loss and exercise do not work.   Fat and cholesterol leaves deposits in your arteries that can block them. This causes heart disease and vessel disease elsewhere in your body.  If your cholesterol is found to be high, or if you have heart disease or certain other medical conditions, then you may need to have your cholesterol monitored frequently and be treated with medication.   Ask if you should have a cardiac stress test if your history suggests this. A stress test is a test done on a treadmill that looks for heart disease. This test can find disease prior to there being a problem.  Menopause can be associated with physical symptoms and risks. Hormone replacement therapy is available to decrease these. You should talk to your caregiver about whether starting or continuing to take hormones is right for you.   Osteoporosis is a disease in which the bones lose minerals and strength as we age. This can result in serious bone fractures. Risk of osteoporosis can be identified using a bone density scan. Women ages 74 and over should discuss this with their caregivers, as should women after menopause who have other risk factors. Ask your caregiver whether you should be taking a calcium supplement and Vitamin D, to reduce the rate of osteoporosis.   Avoid drinking alcohol in excess (more than two drinks per day).  Avoid use of street drugs. Do not share needles with anyone. Ask for professional help if you need assistance or instructions on stopping the use of alcohol, cigarettes, and/or drugs.  Brush your teeth twice a day with fluoride toothpaste, and floss once a day. Good oral hygiene prevents tooth decay and gum disease. The problems can be painful, unattractive, and can cause other health  problems. Visit your dentist for a routine oral and dental check up and preventive care every 6-12 months.   Look at your skin regularly.  Use a mirror to look at your back. Notify your caregivers of changes in moles, especially if there are changes in shapes, colors, a size larger than a pencil eraser, an irregular border, or development of new moles.  Safety:  Use seatbelts 100% of the time, whether driving or as a passenger.  Use safety devices such as hearing protection if you work in environments with loud noise or significant background noise.  Use safety glasses when doing any work that could send debris in to the eyes.  Use a helmet if you ride a bike or motorcycle.  Use appropriate safety gear for contact sports.  Talk to your caregiver about gun safety.  Use sunscreen with a SPF (or skin protection factor) of 15 or greater.  Lighter skinned people are at a greater risk of skin cancer. Don't forget to also wear sunglasses in order to protect your eyes from too much damaging sunlight. Damaging sunlight can accelerate cataract formation.  Practice safe sex. Use condoms. Condoms are used for birth control and to help reduce the spread of sexually transmitted infections (or STIs).  Some of the STIs are gonorrhea (the clap), chlamydia, syphilis, trichomonas, herpes, HPV (human papilloma virus) and HIV (human immunodeficiency virus) which causes AIDS. The herpes, HIV and HPV are viral illnesses that have no cure. These can result in disability, cancer and death.   Keep carbon monoxide and smoke detectors in your home functioning at all times. Change the batteries every 6 months or use a model that plugs into the wall.   Vaccinations:  Stay up to date with your tetanus shots and other required immunizations. You should have a booster for tetanus every 10 years. Be sure to get your flu shot every year, since 5%-20% of the [place] population comes down with the flu. The flu vaccine changes each year,  so being vaccinated once is not enough. Get your shot in the fall, before the flu season peaks.   Other vaccines to consider:  Human Papilloma Virus or HPV causes cancer of the cervix, and other infections that can be transmitted from person to person. There is a vaccine for HPV, and females should get immunized between the ages of 31 and 34. It requires a series of 3 shots.   Pneumococcal vaccine to protect against certain types of pneumonia.  This is normally recommended for adults age 43 or older.  However, adults younger than 61 years old with certain underlying conditions such as diabetes, heart or lung disease should also receive the vaccine.  Shingles vaccine to protect against Varicella Zoster if you are older than age 40, or younger than 61 years old with certain underlying illness.  Hepatitis A vaccine to protect against a form of infection of the liver by a virus acquired from food.  Hepatitis B vaccine to protect against a form of infection of the liver by a virus acquired from blood or body fluids, particularly if you work in health care.  If you plan to travel internationally, check with your local health department for specific vaccination recommendations.  Cancer Screening:  Breast cancer screening is essential to preventive care for women. All women age 51 and older should perform a breast self-exam every month. At age 52 and older, women should have their caregiver complete a breast exam each year. Women at ages 72 and older should have a mammogram (x-ray film) of the breasts. Your caregiver can discuss how often you need mammograms.    Cervical cancer screening includes taking a Pap smear (sample of cells examined under a microscope) from the cervix (end of the uterus). It also includes testing for HPV (Human Papilloma Virus, which can cause cervical cancer). Screening and a pelvic exam should begin at age 37, or 3 years after a woman becomes sexually active. Screening should  occur every year, with a Pap smear but no HPV testing, up to age 24. After age 44, you should have a Pap smear every 3 years with HPV testing, if no HPV was found previously.   Most routine colon cancer screening begins at the age of 80. On a yearly basis, doctors may provide special easy to use take-home tests to check for hidden blood in the stool. Sigmoidoscopy or colonoscopy can detect the earliest forms of colon cancer and is life saving. These tests use a small camera at the end of a tube to directly examine the colon. Speak to your caregiver about this at age 52, when  routine screening begins (and is repeated every 5 years unless early forms of pre-cancerous polyps or small growths are found).

## 2011-01-17 LAB — COMPREHENSIVE METABOLIC PANEL
Albumin: 4.4 g/dL (ref 3.5–5.2)
BUN: 25 mg/dL — ABNORMAL HIGH (ref 6–23)
CO2: 23 mEq/L (ref 19–32)
Glucose, Bld: 92 mg/dL (ref 70–99)
Sodium: 140 mEq/L (ref 135–145)
Total Bilirubin: 0.6 mg/dL (ref 0.3–1.2)
Total Protein: 7 g/dL (ref 6.0–8.3)

## 2011-01-17 LAB — LIPID PANEL
Cholesterol: 218 mg/dL — ABNORMAL HIGH (ref 0–200)
HDL: 69 mg/dL (ref >39)
Total CHOL/HDL Ratio: 3.2 Ratio

## 2011-01-17 LAB — CBC WITH DIFFERENTIAL/PLATELET
Hemoglobin: 12.6 g/dL (ref 12.0–15.0)
Lymphocytes Relative: 36 % (ref 12–46)
Lymphs Abs: 1.3 10*3/uL (ref 0.7–4.0)
MCV: 88.9 fL (ref 78.0–100.0)
Neutrophils Relative %: 53 % (ref 43–77)
Platelets: 159 10*3/uL (ref 150–400)
RBC: 4.22 MIL/uL (ref 3.87–5.11)
WBC: 3.5 10*3/uL — ABNORMAL LOW (ref 4.0–10.5)

## 2011-01-19 ENCOUNTER — Telehealth: Payer: Self-pay | Admitting: *Deleted

## 2011-01-19 NOTE — Telephone Encounter (Addendum)
Message copied by Dorthula Perfect on Mon Jan 19, 2011 11:23 AM ------      Message from: Jac Canavan      Created: Mon Jan 19, 2011  8:26 AM       Her total chol was 218, bad chol 130, white blood count slightly low.  Otherwise liver, kidney, lytes, blood counts, thyroid, urine ok.  Just recommend low fat, healthy diet, good fiber intake, low red meat intake, regular exercise.              Lets have her f/u here in 2-3 wk after the spirometry test.              Pls send copy of labs to her, and copy of EKG and OV note to Dr. Vela Prose, her headache doctor.  Pt notified of labs and a copy was mailed to her.  Also labs, OV note and EKG was faxed to Dr. Vela Prose.  Pt will follow up in 2-3 weeks after spirometry test.  CM,LPN

## 2011-01-20 ENCOUNTER — Encounter (HOSPITAL_COMMUNITY): Payer: BC Managed Care – PPO

## 2011-01-20 ENCOUNTER — Ambulatory Visit (HOSPITAL_COMMUNITY)
Admission: RE | Admit: 2011-01-20 | Discharge: 2011-01-20 | Disposition: A | Discharge status: Home / Self Care | Payer: BC Managed Care – PPO | Source: Ambulatory Visit | Attending: Family Medicine | Admitting: Family Medicine

## 2011-01-20 DIAGNOSIS — R0989 Other specified symptoms and signs involving the circulatory and respiratory systems: Secondary | ICD-10-CM | POA: Insufficient documentation

## 2011-01-20 DIAGNOSIS — Z9289 Personal history of other medical treatment: Secondary | ICD-10-CM

## 2011-01-20 DIAGNOSIS — R0609 Other forms of dyspnea: Secondary | ICD-10-CM | POA: Insufficient documentation

## 2011-01-20 HISTORY — DX: Personal history of other medical treatment: Z92.89

## 2011-02-12 ENCOUNTER — Other Ambulatory Visit: Payer: Self-pay | Admitting: Medical

## 2011-02-12 ENCOUNTER — Telehealth: Payer: Self-pay | Admitting: *Deleted

## 2011-02-12 MED ORDER — BECLOMETHASONE DIPROPIONATE 80 MCG/ACT IN AERS: 1.0000 | INHALATION_SPRAY | Freq: Two times a day (BID) | RESPIRATORY_TRACT | Status: DC

## 2011-02-12 NOTE — Telephone Encounter (Signed)
Pls fax copy of her spirometry to her at fax [phone].  I called back and advised that her spirometry shows moderate airflow limitation.  She has a hx/o asthma. She is not using her inhaler often due to palpitations/jittery feeling.  She only feels SOB with running, but not other exercise.  She will use a trial of Qvar and recheck here in 96mo on blood pressure and asthma.

## 2011-02-12 NOTE — Telephone Encounter (Signed)
pls call the [facility] pharmacy listed in her chart to cancel the Qvar.  i sent it there by mistake.

## 2011-02-12 NOTE — Telephone Encounter (Signed)
Patient called this am requesting results of PFT. I called [facility] and got them for your review. Please advise. Thanks.

## 2011-02-16 NOTE — Telephone Encounter (Signed)
I fax over the patients spirometery report. CLS

## 2011-03-18 ENCOUNTER — Other Ambulatory Visit (INDEPENDENT_AMBULATORY_CARE_PROVIDER_SITE_OTHER): Payer: BC Managed Care – PPO

## 2011-03-18 DIAGNOSIS — Z2911 Encounter for prophylactic immunotherapy for respiratory syncytial virus (RSV): Secondary | ICD-10-CM

## 2011-03-18 DIAGNOSIS — Z23 Encounter for immunization: Secondary | ICD-10-CM

## 2011-04-06 ENCOUNTER — Telehealth: Payer: Self-pay | Admitting: Medical

## 2011-04-06 ENCOUNTER — Other Ambulatory Visit: Payer: Self-pay | Admitting: Medical

## 2011-04-06 MED ORDER — ZOLPIDEM TARTRATE ER 6.25 MG PO TBCR: 6.2500 mg | EXTENDED_RELEASE_TABLET | Freq: Every evening | ORAL | Status: DC | PRN

## 2011-04-06 NOTE — Progress Notes (Signed)
I CALLED OUT THE REFILL ON AMBIEN TO [facility]. CLS

## 2011-04-06 NOTE — Telephone Encounter (Signed)
DONE

## 2011-04-10 ENCOUNTER — Ambulatory Visit: Payer: BC Managed Care – PPO | Admitting: Medical

## 2011-04-23 ENCOUNTER — Ambulatory Visit (INDEPENDENT_AMBULATORY_CARE_PROVIDER_SITE_OTHER): Payer: BC Managed Care – PPO | Admitting: Family Medicine

## 2011-04-23 ENCOUNTER — Encounter: Payer: Self-pay | Admitting: Family Medicine

## 2011-04-23 VITALS — BP 120/88 | HR 76 | Temp 98.5°F | Ht 68.5 in | Wt 165.0 lb

## 2011-04-23 DIAGNOSIS — J069 Acute upper respiratory infection, unspecified: Secondary | ICD-10-CM

## 2011-04-23 DIAGNOSIS — J45901 Unspecified asthma with (acute) exacerbation: Secondary | ICD-10-CM

## 2011-04-23 MED ORDER — GUAIFENESIN ER 1200 MG PO TB12: 1.0000 | ORAL_TABLET | Freq: Two times a day (BID) | ORAL | Status: DC

## 2011-04-23 NOTE — Progress Notes (Signed)
Patient started having headache and tightness in the chest 4 days ago, progressively getting worse.  Some runny nose (clear), postnasal drip, sinus pressure (forehead and cheeks), and tightness in her chest.  Has been needing to use inhaler 2-3 times daily over the last few days, with poor response.  Complaining aching and pain in her right shoulder and upper arm.  Denies fevers.  No known sick contacts (other than hospice patients, not with respiratory illnesses). Has been taking Tylenol, Guaifenesin. Not able to cough any phlegm up.   Past Medical History  Diagnosis Date  . Insomnia   . Anxiety   . Migraines     Dr. Vela Prose, migraines  . Allergy   . Asthma   . Nearsightedness     wears glasses  . Eye injury     in college, some pertinent loss of vision in left eye    Past Surgical History  Procedure Date  . Rotator cuff repair 05/2010    RIGHT  . Tonsillectomy   . Abdominal hysterectomy 1998    total; abnormal peroids  . Polypectomy     colon, precancerous  . Colonoscopy 2010    Dr. Kinnie Scales  . Varicose vein surgery     bilat    History   Social History  . Marital Status: Widowed    Spouse Name: N/A    Number of Children: N/A  . Years of Education: N/A   Occupational History  . Not on file.   Social History Main Topics  . Smoking status: Never Smoker   . Smokeless tobacco: Never Used  . Alcohol Use: 0.6 oz/week    1 Glasses of wine per week  . Drug Use: No  . Sexually Active: Not on file     exercise - walks, [place] cardio at gym 3 x/wk, yoga, widowed    Other Topics Concern  . Not on file   Social History Narrative  . No narrative on file   Current Outpatient Prescriptions on File Prior to Visit  Medication Sig Dispense Refill  . acetaminophen (TYLENOL) 500 MG tablet 1-2 tablets twice to three times daily as needed.  100 tablet  5  . albuterol (PROVENTIL) (2.5 MG/3ML) 0.083% nebulizer solution Take 2.5 mg by nebulization every 6 (six)  hours as needed.  75 mL  5  . beclomethasone (QVAR) 80 MCG/ACT inhaler Inhale 1 puff into the lungs 2 (two) times daily.  1 Inhaler  0  . cetirizine (ZYRTEC) 10 MG tablet Take 1 tablet (10 mg total) by mouth daily.  90 tablet  3  . Probiotic Product (ACIDOPHILUS PROBIOTIC BLEND) CAPS Take 1 capsule by mouth 3 (three) times daily before meals.  150 capsule  5  . zolpidem (AMBIEN CR) 6.25 MG CR tablet Take 1 tablet (6.25 mg total) by mouth at bedtime as needed.  30 tablet  2    Allergies  Allergen Reactions  . Sulfonamide Derivatives    ROS: Denies nausea, vomiting, diarrhea, skin rashes.  Pain in R shoulder, which is where she previously had surgery, and tends to flare when she isn't feeling well.  No other myalgias.  +fatigue  PHYSICAL EXAM: BP 120/88  Pulse 76  Temp(Src) 98.5 F (36.9 C) (Oral)  Ht 5' 8.5" (1.74 m)  Wt 165 lb (74.844 kg)  BMI 24.72 kg/m2 Well developed female, with frequent cough, otherwise in no distress. Speaking easily in full sentences HEENT:  PERRL, EOMI, conjunctiva clear; TM's and EAC's normal.  Nasal mucosa mildly edematous, no purulence.  Mild tenderness at sinuses x 4 (frontal and maxillary).  OP without erythema or lesions Neck: no lymphadenopathy or masses Heart: regular rate and rhythm without murmur Lungs: clear bilaterally.  Fair air movement.  No wheezes with forced expiration. No rales or ronchi PF 350/375/275  Goal approx 420 (pt's best has been around 400)  ASSESSMENT/PLAN: 1. URI (upper respiratory infection)   2. Asthma exacerbation     Decongestants, mucinex.  Continue inhaler every 4-6 hours as needed.  Consider short course of steroids if breathing isn't improving as URI improves over the next few days. F/u next week if worsening shortness of breath, fevers, worsening/ongoing symptoms.

## 2011-04-23 NOTE — Patient Instructions (Signed)
  Decongestants, mucinex.  Continue inhaler every 4-6 hours as needed F/u next week if worsening shortness of breath, fevers, worsening/ongoing symptoms.

## 2011-04-24 ENCOUNTER — Encounter: Payer: Self-pay | Admitting: Family Medicine

## 2011-06-15 ENCOUNTER — Ambulatory Visit (INDEPENDENT_AMBULATORY_CARE_PROVIDER_SITE_OTHER): Payer: BC Managed Care – PPO | Admitting: Family Medicine

## 2011-06-15 ENCOUNTER — Encounter: Payer: Self-pay | Admitting: Family Medicine

## 2011-06-15 VITALS — BP 158/76 | HR 68 | Temp 97.5°F | Ht 68.5 in | Wt 161.0 lb

## 2011-06-15 DIAGNOSIS — R05 Cough: Secondary | ICD-10-CM

## 2011-06-15 DIAGNOSIS — J45901 Unspecified asthma with (acute) exacerbation: Secondary | ICD-10-CM | POA: Insufficient documentation

## 2011-06-15 DIAGNOSIS — J069 Acute upper respiratory infection, unspecified: Secondary | ICD-10-CM

## 2011-06-15 MED ORDER — DEXTROMETHORPHAN POLISTIREX 30 MG/5ML PO LQCR: 60.0000 mg | Freq: Two times a day (BID) | ORAL | Status: AC

## 2011-06-15 MED ORDER — BENZONATATE 200 MG PO CAPS: 200.0000 mg | ORAL_CAPSULE | Freq: Three times a day (TID) | ORAL | Status: AC | PRN

## 2011-06-15 MED ORDER — AZITHROMYCIN 250 MG PO TABS: ORAL_TABLET | ORAL | Status: AC

## 2011-06-15 NOTE — Patient Instructions (Signed)
Please use the Qvar as directed EVERY DAY (twice daily).  This will take a week or so to have it's full effect, and is a preventative medication.  It doesn't work acutely for shortness of breath.  Use the Albuterol inhaler as a "rescue inhaler"--use this when feeling tight/wheezy/short of breath.  Once the Qvar is in your system, you likely will need to use the albuterol much less frequently, and you may not even need to use the albuterol prior to exercise.  Continue the mucinex to help loosen phlegm. Use the decongestant only while having sinus pain--this is raising your blood pressure, so use only short-term.  Start the zpak if you develop fever, discolored mucus or phlegm, or persistent/worsening cough.  Follow up here if worsening shortness of breath, or persistent cough despite using the Z-pak. Remember that the zpak is effective for 10 days (even though you take it for 5)

## 2011-06-15 NOTE — Progress Notes (Signed)
Chief complaint: cough.  Seen 04/23/11 for URI, got better and then the week after Christmas came back again. SInce last Tuesday, starts out with a real bad headcold then goes to her chest  HPI: Began 6 days ago with head congestion, eye pain, runny nose, sneezing. Now has moved into chest, coughing.  Mucus from nose is clear.  Phlegm from cough is also clear.  Denies sore throat.  Has h/o asthma, and having some chest tightness and shortness of breath/wheezing.  Used her inhaler with some benefit.  Used it once yesterday and once today. Upon further review, she was using the Qvar prn, and not using the albuterol at all. Has also been taking Mucinex, OTC decongestant.  Sinus pressure was bad yesterday, a little better today.   Past Medical History  Diagnosis Date  . Insomnia   . Anxiety   . Migraines     Dr. Vela Prose, migraines  . Allergy   . Asthma   . Nearsightedness     wears glasses  . Eye injury     in college, some pertinent loss of vision in left eye    Past Surgical History  Procedure Date  . Rotator cuff repair 05/2010    RIGHT  . Tonsillectomy   . Abdominal hysterectomy 1998    total; abnormal peroids  . Polypectomy     colon, precancerous  . Colonoscopy 2010    Dr. Kinnie Scales  . Varicose vein surgery     bilat    History   Social History  . Marital Status: Widowed    Spouse Name: N/A    Number of Children: N/A  . Years of Education: N/A   Occupational History  . 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 Smoking status: Never Smoker   . Smokeless tobacco: Never Used  . Alcohol Use: 0.6 oz/week    1 Glasses of wine per week  . Drug Use: No  . Sexually Active: Not on file     exercise - walks, [place], cardio at gym 3 x/wk, yoga, widowed    Other Topics Concern  . Not on file   Social History Narrative  . No narrative on file   Current Outpatient Prescriptions on File Prior to Visit  Medication Sig Dispense  Refill  . acetaminophen (TYLENOL) 500 MG tablet 1-2 tablets twice to three times daily as needed.  100 tablet  5  . beclomethasone (QVAR) 80 MCG/ACT inhaler Inhale 1 puff into the lungs 2 (two) times daily.  1 Inhaler  0  . cetirizine (ZYRTEC) 10 MG tablet Take 1 tablet (10 mg total) by mouth daily.  90 tablet  3  . Probiotic Product (ACIDOPHILUS PROBIOTIC BLEND) CAPS Take 1 capsule by mouth 3 (three) times daily before meals.  150 capsule  5  . zolpidem (AMBIEN CR) 6.25 MG CR tablet Take 1 tablet (6.25 mg total) by mouth at bedtime as needed.  30 tablet  2    Allergies  Allergen Reactions  . Sulfonamide Derivatives    ROS:  Denies fevers, nausea, vomiting, diarrhea, skin rash.  PHYSICAL EXAM: BP 158/76  Pulse 68  Temp(Src) 97.5 F (36.4 C) (Oral)  Ht 5' 8.5" (1.74 m)  Wt 161 lb (73.029 kg)  BMI 24.12 kg/m2 Well developed, pleasant female, with frequent dry cough. HEENT: PERRL, EOMI, conjunctiva clear.  TM's and EAC's normal.  OP with slight cobblestoning posteriorly, no erythema.  +tenderness over maxillary  sinuses.  Nose--mod edema, no purulence, no erythema Neck: no lymphadenopathy Heart: regular rate and rhythm without murmur Lungs: decreased air movement with coarse breath sounds and wheezes R>L.  After albuterol nebulizer, lungs were completely clear, with good air movement  ASSESSMENT/PLAN:  1. URI (upper respiratory infection)  azithromycin (ZITHROMAX) 250 MG tablet  2. Cough  dextromethorphan (DELSYM) 30 MG/5ML liquid, benzonatate (TESSALON) 200 MG capsule  3. Asthma exacerbation     Educated regarding proper use of preventative/steroid inhaler, and albuterol.  Start Z-pak if persistent/worsening symptoms

## 2011-06-17 MED ORDER — ALBUTEROL SULFATE (5 MG/ML) 0.5% IN NEBU: 2.5000 mg | INHALATION_SOLUTION | Freq: Once | RESPIRATORY_TRACT | Status: DC

## 2011-07-04 ENCOUNTER — Other Ambulatory Visit: Payer: Self-pay | Admitting: Medical

## 2011-07-06 NOTE — Telephone Encounter (Signed)
RX refill for Ambien 

## 2011-09-09 DIAGNOSIS — Z9289 Personal history of other medical treatment: Secondary | ICD-10-CM

## 2011-09-09 HISTORY — DX: Personal history of other medical treatment: Z92.89

## 2011-09-10 ENCOUNTER — Telehealth: Payer: Self-pay | Admitting: Internal Medicine

## 2011-09-10 DIAGNOSIS — A6000 Herpesviral infection of urogenital system, unspecified: Secondary | ICD-10-CM

## 2011-09-10 MED ORDER — ACYCLOVIR 400 MG PO TABS: ORAL_TABLET | ORAL | Status: DC

## 2011-09-10 NOTE — Telephone Encounter (Signed)
Pt advised.

## 2011-09-10 NOTE — Telephone Encounter (Signed)
Spoke with patient and she actually has genital herpes, she said she was with a patient and did not want to divulge that information in front of her. She has blood tests in the late 1980's by Dr. Gilberto Better. See progress section of her chart look like there was a phone call from her and acyclovir was called in by you. She said either acyclovir or Valtrex works, which ever is cheaper.

## 2011-09-10 NOTE — Telephone Encounter (Signed)
sent 

## 2011-09-10 NOTE — Telephone Encounter (Signed)
Computer reviewed--no record of h/o HSV or Valtrex, therefore needs OV (unless you get add'l info from chart or pt...)

## 2011-09-10 NOTE — Telephone Encounter (Signed)
Left message for patient to return my call.

## 2011-10-06 ENCOUNTER — Other Ambulatory Visit: Payer: Self-pay | Admitting: Medical

## 2011-10-06 NOTE — Telephone Encounter (Signed)
IS THIS OK 

## 2011-10-07 ENCOUNTER — Ambulatory Visit: Payer: BC Managed Care – PPO | Admitting: Medical

## 2011-10-07 NOTE — Telephone Encounter (Signed)
RX WAS CALLED TO THE PHARMACY FOR AMBIEN PER DAVID SHANE TYSINGER PA-C. CLS

## 2011-10-07 NOTE — Telephone Encounter (Signed)
RX CALLED OUT TO THE PHARMACY. CLS

## 2011-11-09 DIAGNOSIS — R931 Abnormal findings on diagnostic imaging of heart and coronary circulation: Secondary | ICD-10-CM

## 2011-11-09 HISTORY — DX: Abnormal findings on diagnostic imaging of heart and coronary circulation: R93.1

## 2011-12-10 DIAGNOSIS — I10 Essential (primary) hypertension: Secondary | ICD-10-CM

## 2011-12-10 HISTORY — DX: Essential (primary) hypertension: I10

## 2011-12-11 ENCOUNTER — Encounter: Payer: Self-pay | Admitting: Medical

## 2011-12-11 ENCOUNTER — Ambulatory Visit (INDEPENDENT_AMBULATORY_CARE_PROVIDER_SITE_OTHER): Payer: BC Managed Care – PPO | Admitting: Medical

## 2011-12-11 VITALS — BP 160/100 | HR 60 | Temp 98.0°F | Resp 16 | Wt 163.0 lb

## 2011-12-11 DIAGNOSIS — R609 Edema, unspecified: Secondary | ICD-10-CM

## 2011-12-11 DIAGNOSIS — I10 Essential (primary) hypertension: Secondary | ICD-10-CM

## 2011-12-11 LAB — COMPREHENSIVE METABOLIC PANEL
AST: 21 U/L (ref 0–37)
Alkaline Phosphatase: 62 U/L (ref 39–117)
BUN: 21 mg/dL (ref 6–23)
Creat: 0.64 mg/dL (ref 0.50–1.10)

## 2011-12-11 LAB — CBC
HCT: 35.9 % — ABNORMAL LOW (ref 36.0–46.0)
Hemoglobin: 12.6 g/dL (ref 12.0–15.0)
MCH: 30.1 pg (ref 26.0–34.0)
MCHC: 35.1 g/dL (ref 30.0–36.0)

## 2011-12-11 MED ORDER — HYDROCHLOROTHIAZIDE 12.5 MG PO TABS: 12.5000 mg | ORAL_TABLET | Freq: Every day | ORAL | Status: DC

## 2011-12-11 MED ORDER — ATENOLOL 25 MG PO TABS: 25.0000 mg | ORAL_TABLET | Freq: Every day | ORAL | Status: DC

## 2011-12-11 NOTE — Progress Notes (Signed)
  Subjective:   HPI  Shelby Randolph is a 60 y.o. female who presents for c/o elevated BP.  She sees Dr. Vela Prose for headaches, and last several visits has had high blood pressure.   She notes no prior diagnosis of hypertension.  She is here mainly for elevated BP and has been having hand and feet swelling for weeks.  No urinary changes, no vision changes, no numbness, tingling, or weakness.  No speech changes.  She has been having concern since a friend of her's died of heart attack recently.   She has had recent eye doctor f/u. She denies chest pain, SOB, dyspnea, exercise intolerance.  No other c/o.  The following portions of the patient's history were reviewed and updated as appropriate: allergies, current medications, past family history, past medical history, past social history, past surgical history and problem list.  Past Medical History  Diagnosis Date  . Insomnia   . Anxiety   . Migraines     Dr. Vela Prose, migraines  . Allergy   . Asthma   . Nearsightedness     wears glasses  . Eye injury     in college, some pertinent loss of vision in left eye    Allergies  Allergen Reactions  . Sulfonamide Derivatives      Review of Systems ROS reviewed and was negative other than noted in HPI or above.    Objective:   Physical Exam  General appearance: alert, no distress, WD/WN Neck: supple, no lymphadenopathy, no thyromegaly, no masses, no bruits Heart: RRR, normal S1, S2, no murmurs Lungs: CTA bilaterally, no wheezes, rhonchi, or rales Abdomen: +bs, soft, non tender, non distended, no masses, no hepatomegaly, no splenomegaly Pulses: 2+ symmetric, upper and lower extremities, normal cap refill Neuro: CN2-12 intact, nonfocal exam   Adult ECG Report  Indication: hypertension  Rate: 57bpm  Rhythm: sinus bradycardia  QRS Axis: -32 degrees  PR Interval:  QRS Duration: 92ms  QTc:  Conduction Disturbances: right ventricular conduction delay, criteria for LVH  Other  Abnormalities: T wave inversion III and V1  Patient's cardiac risk factors are: hypertension.  EKG comparison: 11/2009, with this EKG showing bradycardia but otherwise no significant changes  Narrative Interpretation: abnormal EKG, but relatively unchanged from 2011     Assessment and Plan :     Encounter Diagnoses  Name Primary?  . Essential hypertension, benign Yes  . Edema    Begin Atenolol 25mg  daily and begin HCTZ 12.5mg  daily.  Labs today, reviewed EKG, and f/u pending labs.

## 2011-12-12 LAB — BRAIN NATRIURETIC PEPTIDE: Brain Natriuretic Peptide: 46.2 pg/mL (ref 0.0–100.0)

## 2011-12-29 ENCOUNTER — Telehealth: Payer: Self-pay | Admitting: Internal Medicine

## 2011-12-30 ENCOUNTER — Other Ambulatory Visit: Payer: Self-pay | Admitting: Medical

## 2011-12-30 MED ORDER — CETIRIZINE HCL 10 MG PO TABS: 10.0000 mg | ORAL_TABLET | Freq: Every day | ORAL | Status: DC

## 2011-12-30 NOTE — Telephone Encounter (Signed)
Called in zytrec to pharmacy

## 2011-12-30 NOTE — Telephone Encounter (Signed)
rx sent

## 2012-01-10 ENCOUNTER — Other Ambulatory Visit: Payer: Self-pay | Admitting: Medical

## 2012-01-12 NOTE — Telephone Encounter (Signed)
RX refill Ambien

## 2012-01-13 NOTE — Telephone Encounter (Signed)
Has this been called in?

## 2012-01-14 NOTE — Telephone Encounter (Signed)
Patients Ambien was called out to the pharmacy per Crosby Oyster PA-C. CLS

## 2012-02-03 ENCOUNTER — Encounter: Payer: Self-pay | Admitting: Medical

## 2012-02-03 ENCOUNTER — Ambulatory Visit (INDEPENDENT_AMBULATORY_CARE_PROVIDER_SITE_OTHER): Payer: BC Managed Care – PPO | Admitting: Medical

## 2012-02-03 VITALS — BP 112/80 | HR 68 | Temp 98.1°F | Resp 16 | Ht 66.0 in | Wt 163.0 lb

## 2012-02-03 DIAGNOSIS — I10 Essential (primary) hypertension: Secondary | ICD-10-CM | POA: Insufficient documentation

## 2012-02-03 DIAGNOSIS — Z23 Encounter for immunization: Secondary | ICD-10-CM

## 2012-02-03 DIAGNOSIS — D72819 Decreased white blood cell count, unspecified: Secondary | ICD-10-CM

## 2012-02-03 DIAGNOSIS — J45909 Unspecified asthma, uncomplicated: Secondary | ICD-10-CM

## 2012-02-03 DIAGNOSIS — Z0000 Encounter for general adult medical examination without abnormal findings: Secondary | ICD-10-CM

## 2012-02-03 DIAGNOSIS — J309 Allergic rhinitis, unspecified: Secondary | ICD-10-CM | POA: Insufficient documentation

## 2012-02-03 LAB — POCT URINALYSIS DIPSTICK
Blood, UA: NEGATIVE
Glucose, UA: NEGATIVE
Leukocytes, UA: NEGATIVE
Nitrite, UA: NEGATIVE
Urobilinogen, UA: NEGATIVE

## 2012-02-03 LAB — CBC WITH DIFFERENTIAL/PLATELET
Basophils Relative: 0 % (ref 0–1)
HCT: 35.8 % — ABNORMAL LOW (ref 36.0–46.0)
Hemoglobin: 12.3 g/dL (ref 12.0–15.0)
Lymphs Abs: 1 10*3/uL (ref 0.7–4.0)
MCH: 29.4 pg (ref 26.0–34.0)
MCHC: 34.4 g/dL (ref 30.0–36.0)
Monocytes Absolute: 0.3 10*3/uL (ref 0.1–1.0)
Monocytes Relative: 9 % (ref 3–12)
Neutro Abs: 1.6 10*3/uL — ABNORMAL LOW (ref 1.7–7.7)

## 2012-02-03 LAB — LIPID PANEL
HDL: 61 mg/dL (ref >39)
LDL Cholesterol: 176 mg/dL — ABNORMAL HIGH (ref 0–99)

## 2012-02-03 MED ORDER — ACIDOPHILUS PROBIOTIC BLEND PO CAPS: 1.0000 | ORAL_CAPSULE | Freq: Three times a day (TID) | ORAL | Status: DC

## 2012-02-03 MED ORDER — ACETAMINOPHEN 500 MG PO TABS: ORAL_TABLET | ORAL | Status: AC

## 2012-02-03 NOTE — Patient Instructions (Signed)

## 2012-02-03 NOTE — Addendum Note (Signed)
Addended by: Janeice Robinson on: 02/03/2012 10:51 AM   Modules accepted: Orders

## 2012-02-03 NOTE — Progress Notes (Signed)
Subjective:   HPI  Shelby Randolph is a 61 y.o. female who presents for a complete physical.    I saw her a month ago for new diagnosis of hypertension.  She is doing well on new Atenolol and HCTZ once daily.  Her headaches are improved as well, and she noticed improvement the first week on medication.  Her other medical providers include gynecology, Dr. Danella Deis for dermatology which she will see soon, and Dr. Vela Prose for migraines (sees q50mo).  She sees gynecologist Dr. Gaye Alken.  This past May had mammogram, normal bone density and routine exam.  She is not taking Ca or Vit D.  Vit D causes itching.    Asthma currently has not been a problem.  Takes daily allergy medication, starts up Qvar in the winter.  Exercising 5 days per week with walking, kickboxing, and Yoga.  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  She is involved in reading charts, billing/insurance.  She lives alone, has 5 children, has her dog and her daughter's dogs currently while she is in the [employer].  She is expecting 3 grandsons in December.  Kids range from 62yo - 46yo.  Reviewed their medical, surgical, family, social, medication, and allergy history and updated chart as appropriate.  Past Medical History  Diagnosis Date  . Insomnia   . Anxiety   . Migraines     Dr. Vela Prose, migraines  . Allergy   . Asthma   . Nearsightedness     wears glasses  . Eye injury     in college, some pertinent loss of vision in left eye  . Genital herpes   . Hypertension 12/2011  . History of mammogram 09/2011  . Routine gynecological examination 09/2011    sees gynecology  . History of bone density study 09/2011    normal  . History of PFTs 01/20/2011    moderate airflow limitation, no significant response to bronchodilator     Past Surgical History  Procedure Date  . Rotator cuff repair 05/2010    RIGHT  . Tonsillectomy   . Abdominal  hysterectomy 1998    total; abnormal peroids  . Polypectomy     colon, precancerous  . Colonoscopy 2010    Dr. Kinnie Renee Beale  . Varicose vein surgery     bilat    Family History  Problem Relation Age of Onset  . COPD Mother     died age 65yo.  emphysema  . Cancer Father 93    colon cancer; died age 58yo  . Cancer Brother 52    colon cancer  . Cancer Maternal Aunt     breast  . Diabetes Neg Hx   . Heart disease Neg Hx   . Hypertension Neg Hx   . Stroke Neg Hx     History   Social History  . Marital Status: Widowed    Spouse Name: N/A    Number of Children: N/A  . Years of Education: N/A   Occupational History  . compliance agent Banker) [employer]    Nurse Practitioner   Social History Main Topics  . Smoking status: Never Smoker   . Smokeless tobacco: Never Used  . Alcohol Use: 1.2 oz/week    2 Glasses of wine per week  . Drug Use: No  . Sexually Active: Not on file     exercise - walks[place] cardio  at gym 3 x/wk, yoga, widowed    Other Topics Concern  . Not on file   Social History Narrative   Lives alone, has 5 children, expecting 3 grandsons in December 2013, exercises 5 days per week - walking, kickboxing, yoga; episcopal    Current Outpatient Prescriptions on File Prior to Visit  Medication Sig Dispense Refill  . acyclovir (ZOVIRAX) 400 MG tablet 1 tablet three times daily for 5 days as needed for recurrence of genital herpes  30 tablet  2  . albuterol (PROVENTIL HFA;VENTOLIN HFA) 108 (90 BASE) MCG/ACT inhaler Inhale 2 puffs into the lungs as needed.      Marland Kitchen atenolol (TENORMIN) 25 MG tablet Take 1 tablet (25 mg total) by mouth daily.  30 tablet  3  . beclomethasone (QVAR) 80 MCG/ACT inhaler Inhale 1 puff into the lungs 2 (two) times daily.  1 Inhaler  0  . cetirizine (ZYRTEC) 10 MG tablet Take 1 tablet (10 mg total) by mouth daily.  90 tablet  3  . hydrochlorothiazide (HYDRODIURIL) 12.5 MG tablet Take 1 tablet (12.5 mg total) by  mouth daily.  30 tablet  3  . promethazine (PHENERGAN) 25 MG tablet Take 25 mg by mouth every 6 (six) hours as needed.      . SUMAtriptan (IMITREX) 25 MG tablet Take 25 mg by mouth every 2 (two) hours as needed.      . zolpidem (AMBIEN CR) 6.25 MG CR tablet TAKE ONE TABLET AT BEDTIME  30 tablet  2   Current Facility-Administered Medications on File Prior to Visit  Medication Dose Route Frequency Provider Last Rate Last Dose  . albuterol (PROVENTIL) (5 MG/ML) 0.5% nebulizer solution 2.5 mg  2.5 mg Nebulization Once Joselyn Arrow, MD        Allergies  Allergen Reactions  . Sulfonamide Derivatives    Review of Systems Constitutional: -fever, -chills, -sweats, -unexpected weight change, -anorexia, -fatigue Allergy: -sneezing, -itching, -congestion Dermatology: denies changing moles, rash, lumps, new worrisome lesions ENT: -runny nose, -ear pain, -sore throat, -hoarseness, -sinus pain, -teeth pain, -tinnitus, -hearing loss, -epistaxis Cardiology:  -chest pain, -palpitations, -edema, -orthopnea, -paroxysmal nocturnal dyspnea Respiratory: -cough, -shortness of breath, -dyspnea on exertion, -wheezing, -hemoptysis Gastroenterology: +abdominal pain, -nausea, -vomiting, +diarrhea, -constipation, -blood in stool, -changes in bowel movement, -dysphagia Hematology: -bleeding or bruising problems Musculoskeletal: -arthralgias, -myalgias, -joint swelling, -back pain, -neck pain, -cramping, -gait changes Ophthalmology: -vision changes, -eye redness, -itching, -discharge Urology: -dysuria, -difficulty urinating, -hematuria, -urinary frequency, -urgency, incontinence Neurology: -headache, -weakness, -tingling, -numbness, -speech abnormality, -memory loss, -falls, -dizziness Psychology:  -depressed mood, -agitation, -sleep problems       Objective:   Physical Exam  Filed Vitals:   02/03/12 0948  BP: 112/80  Pulse: 68  Temp: 98.1 F (36.7 C)  Resp: 16    General appearance: alert, no distress,  WD/WN, white female, looks stated age Skin: right superior lateral orbit with 3mm raised lesion, uniform color, scattered lesions throughout, some appear to be benign solar keratosis including right cheek, some appear to be seborrheic keratosis on back, and a few on chest possibly actinic keratosis HEENT: normocephalic, conjunctiva/corneas normal, sclerae anicteric, PERRLA, EOMi, nares patent, no discharge or erythema, pharynx normal Oral cavity: MMM, tongue normal, teeth in good repair Neck: supple, no lymphadenopathy, no thyromegaly, no masses, normal ROM, no bruits Chest: non tender, normal shape and expansion Heart: RRR, normal S1, S2, no murmurs Lungs: CTA bilaterally, no wheezes, rhonchi, or rales Abdomen: +bs, soft, mild RUQ tenderness, but otherwise non  tender, non distended, no masses, no hepatomegaly, no splenomegaly, no bruits Back: non tender, normal ROM, no scoliosis Musculoskeletal: upper extremities non tender, no obvious deformity, normal ROM throughout, lower extremities non tender, no obvious deformity, normal ROM throughout Extremities: no edema, no cyanosis, no clubbing Pulses: 2+ symmetric, upper and lower extremities, normal cap refill Neurological: alert, oriented x 3, CN2-12 intact, strength normal upper extremities and lower extremities, sensation normal throughout, DTRs 2+ throughout, no cerebellar signs, gait normal Psychiatric: normal affect, behavior normal, pleasant  Breast/ GU: deferred to gynecology   Assessment :    Encounter Diagnoses  Name Primary?  . Routine general medical examination at a health care facility Yes  . Essential hypertension, benign   . Asthma   . Allergic rhinitis   . Leukocytopenia   . Need for pneumococcal vaccination       Plan:    Physical exam - discussed healthy lifestyle, diet, exercise, preventative care, vaccinations, and addressed their concerns.  She had flu shot yesterday at work. She will see Dr. Kinnie Anjolaoluwa Siguenza soon for repeat  colonoscopy and to discuss chronic stomach issues.  Of note, she has hx/o gall stones and diverticula on CT scan. See dentist and eye doctor yearly.  Hypertension - new diagnosis 25mo ago.  Much improved.  C/t Atenolol 25mg  and HCTZ 12.5mg  daily  Asthma - controlled, not on daily QVAR until winter season.  Uses albuterol prn, not often at current  Allergic rhinitis - uses daily zyrtec, controlled  Leukocytopenia on prior labs- CBC with diff today  Pneumococcal vaccine, VIS and counseling today.   F/u pending labs.

## 2012-02-04 LAB — VITAMIN D 25 HYDROXY (VIT D DEFICIENCY, FRACTURES): Vit D, 25-Hydroxy: 38 ng/mL (ref 30–89)

## 2012-04-11 ENCOUNTER — Other Ambulatory Visit: Payer: Self-pay | Admitting: Medical

## 2012-04-11 NOTE — Telephone Encounter (Signed)
I CALLED OUT THE PATIENTS AMBIEN TO THE PHARMACY PER DAVID TYSINGER PA-C. CLS

## 2012-04-11 NOTE — Telephone Encounter (Signed)
Can I refill the Ambien?

## 2012-05-09 ENCOUNTER — Ambulatory Visit (INDEPENDENT_AMBULATORY_CARE_PROVIDER_SITE_OTHER): Payer: BC Managed Care – PPO | Admitting: Medical

## 2012-05-09 ENCOUNTER — Encounter: Payer: Self-pay | Admitting: Medical

## 2012-05-09 VITALS — BP 122/80 | HR 59 | Temp 98.1°F | Resp 16 | Wt 169.0 lb

## 2012-05-09 DIAGNOSIS — J40 Bronchitis, not specified as acute or chronic: Secondary | ICD-10-CM

## 2012-05-09 MED ORDER — AZITHROMYCIN 500 MG PO TABS: 500.0000 mg | ORAL_TABLET | Freq: Every day | ORAL | Status: DC

## 2012-05-09 MED ORDER — HYDROCODONE-HOMATROPINE 5-1.5 MG/5ML PO SYRP: 5.0000 mL | ORAL_SOLUTION | Freq: Three times a day (TID) | ORAL | Status: DC | PRN

## 2012-05-09 NOTE — Progress Notes (Signed)
Subjective:  Shelby Randolph is a 61 y.o. female who presents for cough.   Been having 5-6 days of cough, sputum productive, bad sore throat, headache, some chills and body aches.  Started on Christmas day.  Some sinus pressure as well.  Has 2 new grandchildren and one on the way in about a week.  Needs to be healthy for them.  Not having to use inhaler currently, no SOB/wheezing.  No other aggravating or relieving factors.  No other c/o.  The following portions of the patient's history were reviewed and updated as appropriate: allergies, current medications, past family history, past medical history, past social history, past surgical history and problem list.  Past Medical History  Diagnosis Date  . Insomnia   . Anxiety   . Migraines     Dr. Vela Prose, migraines  . Allergy   . Asthma   . Nearsightedness     wears glasses  . Eye injury     in college, some pertinent loss of vision in left eye  . Genital herpes   . Hypertension 12/2011  . History of mammogram 09/2011  . Routine gynecological examination 09/2011    sees gynecology  . History of bone density study 09/2011    normal  . History of PFTs 01/20/2011    moderate airflow limitation, no significant response to bronchodilator   ROS as in subjective  Objective:   Filed Vitals:   05/09/12 1614  BP: 122/80  Pulse: 59  Temp: 98.1 F (36.7 C)  Resp: 16    General appearance: Alert, WD/WN, no distress,somewhat ill appearing                             Skin: warm, no rash, no diaphoresis                           Head: maxillary+ sinus tenderness                            Eyes: conjunctiva normal, corneas clear, PERRLA                            Ears: pearly TMs, external ear canals normal                          Nose: septum midline, turbinates swollen, with erythema and clear discharge             Mouth/throat: MMM, tongue normal, mild pharyngeal erythema                           Neck: supple, no adenopathy, no thyromegaly,  nontender                          Heart: RRR, normal S1, S2, no murmurs                         Lungs: +bronchial breath sounds, few+scattered rhonchi, no wheezes, no rales                Extremities: no edema, nontender     Assessment and Plan:   Encounter Diagnosis  Name Primary?  . Bronchitis Yes    Prescription  given today for Azithromycin antibiotic and Hycodan syrup as below.  Suggested symptomatic OTC remedies for cough and congestion.  Nasal saline spray for nasal congestion.  Tylenol or Ibuprofen OTC for fever and malaise.  Call/return in 2-3 days if symptoms are worse or not improving.

## 2012-06-25 ENCOUNTER — Other Ambulatory Visit: Payer: Self-pay

## 2012-06-30 ENCOUNTER — Encounter: Payer: Self-pay | Admitting: Family Medicine

## 2012-07-11 ENCOUNTER — Telehealth: Payer: Self-pay | Admitting: Internal Medicine

## 2012-07-11 ENCOUNTER — Other Ambulatory Visit: Payer: Self-pay | Admitting: Medical

## 2012-07-11 MED ORDER — ZOLPIDEM TARTRATE ER 6.25 MG PO TBCR: 6.2500 mg | EXTENDED_RELEASE_TABLET | Freq: Every evening | ORAL | Status: DC | PRN

## 2012-07-11 NOTE — Progress Notes (Signed)
MED CALLED

## 2012-07-11 NOTE — Telephone Encounter (Signed)
CALLED AMBIEN IN PER SHANE

## 2012-10-07 ENCOUNTER — Other Ambulatory Visit: Payer: Self-pay | Admitting: Medical

## 2012-10-07 NOTE — Telephone Encounter (Signed)
I called out Ambien to the patients pharmacy per David Tysinger PA-C. CLS 

## 2012-10-07 NOTE — Telephone Encounter (Signed)
Is this ok?

## 2012-10-21 ENCOUNTER — Other Ambulatory Visit: Payer: Self-pay | Admitting: Medical

## 2012-10-31 ENCOUNTER — Encounter: Payer: Self-pay | Admitting: Internal Medicine

## 2012-11-03 ENCOUNTER — Encounter: Payer: Self-pay | Admitting: Family Medicine

## 2012-11-03 ENCOUNTER — Ambulatory Visit (INDEPENDENT_AMBULATORY_CARE_PROVIDER_SITE_OTHER): Payer: BLUE CROSS/BLUE SHIELD | Admitting: Family Medicine

## 2012-11-03 VITALS — BP 114/70 | HR 56

## 2012-11-03 DIAGNOSIS — N390 Urinary tract infection, site not specified: Secondary | ICD-10-CM

## 2012-11-03 DIAGNOSIS — R350 Frequency of micturition: Secondary | ICD-10-CM

## 2012-11-03 LAB — POCT URINALYSIS DIPSTICK
Bilirubin, UA: NEGATIVE
Ketones, UA: NEGATIVE
Spec Grav, UA: 1.01

## 2012-11-03 MED ORDER — CIPROFLOXACIN HCL 500 MG PO TABS: 500.0000 mg | ORAL_TABLET | Freq: Two times a day (BID) | ORAL | Status: DC

## 2012-11-03 NOTE — Patient Instructions (Signed)
Urinary Tract Infection  Urinary tract infections (UTIs) can develop anywhere along your urinary tract. Your urinary tract is your body's drainage system for removing wastes and extra water. Your urinary tract includes two kidneys, two ureters, a bladder, and a urethra. Your kidneys are a pair of bean-shaped organs. Each kidney is about the size of your fist. They are located below your ribs, one on each side of your spine.  CAUSES  Infections are caused by microbes, which are microscopic organisms, including fungi, viruses, and bacteria. These organisms are so small that they can only be seen through a microscope. Bacteria are the microbes that most commonly cause UTIs.  SYMPTOMS   Symptoms of UTIs may vary by age and gender of the patient and by the location of the infection. Symptoms in young women typically include a frequent and intense urge to urinate and a painful, burning feeling in the bladder or urethra during urination. Older women and men are more likely to be tired, shaky, and weak and have muscle aches and abdominal pain. A fever may mean the infection is in your kidneys. Other symptoms of a kidney infection include pain in your back or sides below the ribs, nausea, and vomiting.  DIAGNOSIS  To diagnose a UTI, your caregiver will ask you about your symptoms. Your caregiver also will ask to provide a urine sample. The urine sample will be tested for bacteria and white blood cells. White blood cells are made by your body to help fight infection.  TREATMENT   Typically, UTIs can be treated with medication. Because most UTIs are caused by a bacterial infection, they usually can be treated with the use of antibiotics. The choice of antibiotic and length of treatment depend on your symptoms and the type of bacteria causing your infection.  HOME CARE INSTRUCTIONS   If you were prescribed antibiotics, take them exactly as your caregiver instructs you. Finish the medication even if you feel better after you  have only taken some of the medication.   Drink enough water and fluids to keep your urine clear or pale yellow.   Avoid caffeine, tea, and carbonated beverages. They tend to irritate your bladder.   Empty your bladder often. Avoid holding urine for long periods of time.   Empty your bladder before and after sexual intercourse.   After a bowel movement, women should cleanse from front to back. Use each tissue only once.  SEEK MEDICAL CARE IF:    You have back pain.   You develop a fever.   Your symptoms do not begin to resolve within 3 days.  SEEK IMMEDIATE MEDICAL CARE IF:    You have severe back pain or lower abdominal pain.   You develop chills.   You have nausea or vomiting.   You have continued burning or discomfort with urination.  MAKE SURE YOU:    Understand these instructions.   Will watch your condition.   Will get help right away if you are not doing well or get worse.  Document Released: 02/04/2005 Document Revised: 10/27/2011 Document Reviewed: 06/05/2011  Exit[employer] Patient Information 2014 [employer].

## 2012-11-03 NOTE — Progress Notes (Signed)
  Subjective:    Patient ID: Shelby Randolph, female    DOB: 09-14-1950, 62 y.o.   MRN: [id number]  HPI Has a 4 day history of urinary frequency, urgency followed by malaise, headache, low backache. No dysuria or hematuria him a fever or chills. No rhinorrhea, sore throat, nasal congestion.   Review of Systems     Objective:   Physical Exam alert and in no distress. Tympanic membranes and canals are normal. Throat is clear. Tonsils are normal. Neck is supple without adenopathy or thyromegaly. Cardiac exam shows a regular sinus rhythm without murmurs or gallops. Lungs are clear to auscultation. Urine microscopic showed red cells, epithelial cells and scattered bacteria       Assessment & Plan:  Frequent urination - Plan: POCT Urinalysis Dipstick, ciprofloxacin (CIPRO) 500 MG tablet  UTI (lower urinary tract infection) - Plan: ciprofloxacin (CIPRO) 500 MG tablet

## 2012-11-08 ENCOUNTER — Encounter: Payer: Self-pay | Admitting: Family Medicine

## 2012-11-09 ENCOUNTER — Other Ambulatory Visit: Payer: Self-pay | Admitting: Family Medicine

## 2012-12-06 ENCOUNTER — Other Ambulatory Visit: Payer: Self-pay | Admitting: Medical

## 2012-12-06 ENCOUNTER — Telehealth: Payer: Self-pay | Admitting: Medical

## 2012-12-06 MED ORDER — ZOLPIDEM TARTRATE ER 6.25 MG PO TBCR: 6.2500 mg | EXTENDED_RELEASE_TABLET | Freq: Every evening | ORAL | Status: DC | PRN

## 2012-12-06 NOTE — Progress Notes (Signed)
Called out med to pharmacy 

## 2012-12-07 NOTE — Telephone Encounter (Signed)
TSD  

## 2013-01-13 ENCOUNTER — Telehealth: Payer: Self-pay | Admitting: Family Medicine

## 2013-01-13 ENCOUNTER — Other Ambulatory Visit: Payer: Self-pay | Admitting: Medical

## 2013-01-13 NOTE — Telephone Encounter (Signed)
I believe she is due for physical.  Also, I last wrote for #30 as needed for flare up.  So not sure what quantity is needed for 90 day supply.   It depends on the frequency of flare ups.

## 2013-01-13 NOTE — Telephone Encounter (Signed)
This pt is switching ins companies and would like a 90 day supply if acyclovir with her old ins. Her new ins is going to have less drug coverage. Pt uses [facility]. Pt saw shane for cpe.

## 2013-01-16 ENCOUNTER — Other Ambulatory Visit: Payer: Self-pay | Admitting: Family Medicine

## 2013-01-16 NOTE — Telephone Encounter (Signed)
Patient is aware of her medication that was sent to the pharmacy. CLS

## 2013-01-16 NOTE — Telephone Encounter (Signed)
MEDICATION ALREADY SENT

## 2013-01-17 ENCOUNTER — Telehealth: Payer: Self-pay | Admitting: Medical

## 2013-01-17 MED ORDER — ACYCLOVIR 400 MG PO TABS: 400.0000 mg | ORAL_TABLET | Freq: Once | ORAL | Status: DC

## 2013-01-17 NOTE — Telephone Encounter (Signed)
I left a message on her voicemail about her Rx and I also called the medication into her pharmacy. CLS

## 2013-01-26 ENCOUNTER — Other Ambulatory Visit: Payer: Self-pay | Admitting: Medical

## 2013-02-02 ENCOUNTER — Telehealth: Payer: Self-pay | Admitting: Medical

## 2013-02-02 NOTE — Telephone Encounter (Signed)
Pt is requesting refill of Zolpidem Tartrate ER 6.25 mg #30 to [facility]

## 2013-02-03 ENCOUNTER — Other Ambulatory Visit: Payer: Self-pay | Admitting: Family Medicine

## 2013-02-03 MED ORDER — ZOLPIDEM TARTRATE ER 6.25 MG PO TBCR: 6.2500 mg | EXTENDED_RELEASE_TABLET | Freq: Every evening | ORAL | Status: DC | PRN

## 2013-02-03 NOTE — Telephone Encounter (Signed)
pls call out with no refills, and order in system

## 2013-02-03 NOTE — Telephone Encounter (Signed)
Rx refill was called out to the pharmacy per Crosby Oyster PA-C. CLS

## 2013-02-07 ENCOUNTER — Telehealth: Payer: Self-pay | Admitting: Internal Medicine

## 2013-02-07 ENCOUNTER — Other Ambulatory Visit: Payer: Self-pay | Admitting: Medical

## 2013-02-07 DIAGNOSIS — Z0000 Encounter for general adult medical examination without abnormal findings: Secondary | ICD-10-CM

## 2013-02-07 MED ORDER — ACIDOPHILUS PROBIOTIC BLEND PO CAPS: 1.0000 | ORAL_CAPSULE | Freq: Three times a day (TID) | ORAL | Status: DC

## 2013-02-07 NOTE — Telephone Encounter (Signed)
Refill request for 21st centry acidophilus to [facility]

## 2013-02-27 ENCOUNTER — Telehealth: Payer: Self-pay | Admitting: Medical

## 2013-02-27 ENCOUNTER — Other Ambulatory Visit: Payer: Self-pay | Admitting: Medical

## 2013-02-27 MED ORDER — ATENOLOL 25 MG PO TABS: 25.0000 mg | ORAL_TABLET | Freq: Every day | ORAL | Status: DC

## 2013-02-27 MED ORDER — HYDROCHLOROTHIAZIDE 12.5 MG PO CAPS: 12.5000 mg | ORAL_CAPSULE | Freq: Once | ORAL | Status: DC

## 2013-02-27 NOTE — Telephone Encounter (Signed)
Pt scheduled a cpe needs meds refill. She uses [facility]. She needs atenolol and hydrochlorothiazide.

## 2013-02-27 NOTE — Telephone Encounter (Signed)
Medication refill was sent to the pharmacy for 30 days only. CLS

## 2013-03-06 ENCOUNTER — Other Ambulatory Visit: Payer: Self-pay | Admitting: Medical

## 2013-03-06 ENCOUNTER — Telehealth: Payer: Self-pay | Admitting: Family Medicine

## 2013-03-06 MED ORDER — ZOLPIDEM TARTRATE ER 6.25 MG PO TBCR: 6.2500 mg | EXTENDED_RELEASE_TABLET | Freq: Every evening | ORAL | Status: DC | PRN

## 2013-03-06 NOTE — Telephone Encounter (Signed)
Shelby Randolph from [facility] [phone] called and states pt has a cpe on 03/24/13.  Will this make any difference for her Ambien refill.  Please advise pharm.

## 2013-03-06 NOTE — Telephone Encounter (Signed)
Called in ambien 

## 2013-03-06 NOTE — Telephone Encounter (Signed)
Is this okay to fill? 

## 2013-03-06 NOTE — Telephone Encounter (Signed)
pls call in 30 day supply [place]

## 2013-03-16 ENCOUNTER — Other Ambulatory Visit: Payer: Self-pay

## 2013-03-24 ENCOUNTER — Encounter: Payer: Self-pay | Admitting: Medical

## 2013-03-24 ENCOUNTER — Ambulatory Visit (INDEPENDENT_AMBULATORY_CARE_PROVIDER_SITE_OTHER): Payer: No Typology Code available for payment source | Admitting: Medical

## 2013-03-24 VITALS — BP 120/82 | HR 60 | Temp 98.0°F | Resp 16 | Ht 66.0 in | Wt 168.0 lb

## 2013-03-24 DIAGNOSIS — I10 Essential (primary) hypertension: Secondary | ICD-10-CM

## 2013-03-24 DIAGNOSIS — G43909 Migraine, unspecified, not intractable, without status migrainosus: Secondary | ICD-10-CM

## 2013-03-24 DIAGNOSIS — J45909 Unspecified asthma, uncomplicated: Secondary | ICD-10-CM

## 2013-03-24 DIAGNOSIS — E785 Hyperlipidemia, unspecified: Secondary | ICD-10-CM

## 2013-03-24 DIAGNOSIS — J309 Allergic rhinitis, unspecified: Secondary | ICD-10-CM

## 2013-03-24 DIAGNOSIS — G4700 Insomnia, unspecified: Secondary | ICD-10-CM

## 2013-03-24 DIAGNOSIS — Z0000 Encounter for general adult medical examination without abnormal findings: Secondary | ICD-10-CM

## 2013-03-24 LAB — CBC WITH DIFFERENTIAL/PLATELET
Basophils Relative: 0 % (ref 0–1)
Eosinophils Absolute: 0 10*3/uL (ref 0.0–0.7)
HCT: 36.2 % (ref 36.0–46.0)
Lymphocytes Relative: 33 % (ref 12–46)
Lymphs Abs: 1.3 10*3/uL (ref 0.7–4.0)
MCH: 31 pg (ref 26.0–34.0)
Monocytes Absolute: 0.4 10*3/uL (ref 0.1–1.0)
Neutro Abs: 2.1 10*3/uL (ref 1.7–7.7)
Neutrophils Relative %: 56 % (ref 43–77)
Platelets: 170 10*3/uL (ref 150–400)
RBC: 4.13 MIL/uL (ref 3.87–5.11)
WBC: 3.8 10*3/uL — ABNORMAL LOW (ref 4.0–10.5)

## 2013-03-24 LAB — LIPID PANEL
Cholesterol: 246 mg/dL — ABNORMAL HIGH (ref 0–200)
LDL Cholesterol: 154 mg/dL — ABNORMAL HIGH (ref 0–99)
Total CHOL/HDL Ratio: 4.2 Ratio
VLDL: 33 mg/dL (ref 0–40)

## 2013-03-24 MED ORDER — ZOLPIDEM TARTRATE 5 MG PO TABS: 5.0000 mg | ORAL_TABLET | Freq: Every evening | ORAL | Status: DC | PRN

## 2013-03-24 MED ORDER — ATENOLOL 25 MG PO TABS: 25.0000 mg | ORAL_TABLET | Freq: Every day | ORAL | Status: DC

## 2013-03-24 MED ORDER — ALBUTEROL SULFATE HFA 108 (90 BASE) MCG/ACT IN AERS: 2.0000 | INHALATION_SPRAY | RESPIRATORY_TRACT | Status: DC | PRN

## 2013-03-24 MED ORDER — HYDROCHLOROTHIAZIDE 12.5 MG PO CAPS: 12.5000 mg | ORAL_CAPSULE | Freq: Once | ORAL | Status: DC

## 2013-03-24 MED ORDER — ZOLPIDEM TARTRATE 10 MG PO TABS: 10.0000 mg | ORAL_TABLET | Freq: Every evening | ORAL | Status: DC | PRN

## 2013-03-24 MED ORDER — CETIRIZINE HCL 10 MG PO TABS: 10.0000 mg | ORAL_TABLET | Freq: Every day | ORAL | Status: DC

## 2013-03-24 MED ORDER — TRAZODONE HCL 50 MG PO TABS: ORAL_TABLET | ORAL | Status: DC

## 2013-03-24 NOTE — Patient Instructions (Signed)
Insomnia  Begin Trazodone 50mg  tablet, either 1/2 or 1 tablet at bedtime.  Try holding off on the Ambien and give the trazodone a chance to work.  If needed you can take the Ambien too.  You can either do the CR version you have or 1/2-1 of the Zolpidem 10mg  generic.   Sinuses/nasal congestion  Begin Qnasal 1 spray per nostril twice daily.  Continue Zyrtec.   Use nasal saline flush/neti pot first thing in the morning and in the evening to flush the sinuses.  If not better by Monday and worse sinus pressure, headache, or other sinusitis symptoms, call back.

## 2013-03-24 NOTE — Progress Notes (Signed)
Subjective:   HPI  Shelby Randolph is a 62 y.o. female who presents for a complete physical.  Medical care team includes:  Dr. Danella Deis, Dermatology  Dr. Kinnie Scales, Gastroenterology  Dr. Arelia Sneddon, Gynecology  Dr. Cleatis Polka, Ophthalmology  Dentist  Kristian Covey, PA-C primary care here   Preventative care: Last ophthalmology visit:yes- Dr. Hyacinth Meeker 5/14 Last dental visit:yes Last colonoscopy:10/2012 Last mammogram:10/2012 Last gynecological exam: Last EKG:12/2011 Last labs: last year  Prior vaccinations: TD or Tdap:01/2011 Influenza:02/2013 received at work Pneumococcal:01/2012 Shingles/Zostavax:n/a  Advanced directive:n/a Health care power of attorney:n/a Living will:n/a  Concerns: insomnia - no problems getting to sleep, but can't stay asleep.   No urination concerns, no anxiety concerns at bedtime.  Has just had simply problem staying asleep.  Uses Ambien cr nightly.  No prior use of Trazodone, melatonin, but has used Tylenol PM.  Reviewed their medical, surgical, family, social, medication, and allergy history and updated chart as appropriate.  Past Medical History  Diagnosis Date  . Insomnia   . Anxiety   . Migraines     Dr. Vela Prose, migraines  . Allergy   . Asthma   . Nearsightedness     wears glasses  . Eye injury     in college, some pertinent loss of vision in left eye  . Genital herpes   . Hypertension 12/2011  . History of mammogram     Dr. Arelia Sneddon  . Routine gynecological examination     Dr. Arelia Sneddon  . History of bone density study 09/2011    normal  . History of PFTs 01/20/2011    moderate airflow limitation, no significant response to bronchodilator  . Colon polyp   . Diverticulosis     Past Surgical History  Procedure Laterality Date  . Rotator cuff repair  05/2010    RIGHT  . Tonsillectomy    . Abdominal hysterectomy  1998    total; abnormal peroids  . Polypectomy      colon, precancerous  . Colonoscopy  10/2012    Dr. Kinnie Scales  . Varicose vein  surgery      bilat    History   Social History  . Marital Status: Widowed    Spouse Name: N/A    Number of Children: N/A  . Years of Education: N/A   Occupational History  . compliance agent Banker) [employer]    Nurse Practitioner   Social History Main Topics  . Smoking status: Never Smoker   . Smokeless tobacco: Never Used  . Alcohol Use: 1.2 oz/week    2 Glasses of wine per week  . Drug Use: No  . Sexual Activity: Not on file     Comment: exercise - walks, [place], cardio at gym 3 x/wk, yoga, widowed    Other Topics Concern  . Not on file   Social History Narrative   Lives alone, has 5 children, expecting 3 grandsons in December 2013, exercises 5 days per week - walking, kickboxing, yoga; [employer];      Family History  Problem Relation Age of Onset  . COPD Mother     died age 63yo.  emphysema  . Cancer Father 69    colon cancer; died age 51yo  . Cancer Brother 110    colon cancer  . Cancer Maternal Aunt     breast  . Diabetes Neg Hx   . Heart disease Neg Hx   . Hypertension Neg Hx   . Stroke Neg Hx  Current outpatient prescriptions:acetaminophen (TYLENOL) 500 MG tablet, 1-2 tablets twice to three times daily as needed., Disp: 100 tablet, Rfl: 5;  acyclovir (ZOVIRAX) 400 MG tablet, Take 1 tablet (400 mg total) by mouth once., Disp: 30 tablet, Rfl: 0;  albuterol (PROVENTIL HFA;VENTOLIN HFA) 108 (90 BASE) MCG/ACT inhaler, Inhale 2 puffs into the lungs as needed., Disp: 1 Inhaler, Rfl: 0 atenolol (TENORMIN) 25 MG tablet, Take 1 tablet (25 mg total) by mouth daily., Disp: 90 tablet, Rfl: 3;  cetirizine (ZYRTEC) 10 MG tablet, Take 1 tablet (10 mg total) by mouth daily., Disp: 90 tablet, Rfl: 3;  hydrochlorothiazide (MICROZIDE) 12.5 MG capsule, Take 1 capsule (12.5 mg total) by mouth once., Disp: 90 capsule, Rfl: 3;  hyoscyamine (ANASPAZ) 0.125 MG TBDP tablet, Place 0.125 mg under the tongue., Disp: , Rfl:  Probiotic Product (ACIDOPHILUS  PROBIOTIC BLEND) CAPS, Take 1 capsule by mouth 3 (three) times daily before meals., Disp: 150 capsule, Rfl: 1;  SUMAtriptan (IMITREX) 25 MG tablet, Take 25 mg by mouth every 2 (two) hours as needed., Disp: , Rfl: ;  traZODone (DESYREL) 50 MG tablet, 1/2-1 tablet at bedtime, Disp: 30 tablet, Rfl: 2 zolpidem (AMBIEN) 10 MG tablet, Take 1 tablet (10 mg total) by mouth at bedtime as needed for sleep., Disp: 30 tablet, Rfl: 2 Current facility-administered medications:albuterol (PROVENTIL) (5 MG/ML) 0.5% nebulizer solution 2.5 mg, 2.5 mg, Nebulization, Once, Joselyn Arrow, MD  Allergies  Allergen Reactions  . Mucinex [Guaifenesin Er]     jittery  . Sulfonamide Derivatives      Review of Systems Constitutional: -fever, -chills, -sweats, -unexpected weight change, -decreased appetite, -fatigue Allergy: -sneezing, -itching, -congestion Dermatology: -changing moles, --rash, -lumps ENT: -runny nose, -ear pain, -sore throat, -hoarseness, +sinus pain, -teeth pain, - ringing in ears, -hearing loss, -nosebleeds Cardiology: -chest pain, -palpitations, -swelling, -difficulty breathing when lying flat, -waking up short of breath Respiratory: -cough, -shortness of breath, -difficulty breathing with exercise or exertion, -wheezing, -coughing up blood Gastroenterology: -abdominal pain, -nausea, -vomiting, +diarrhea, -constipation, -blood in stool, -changes in bowel movement, -difficulty swallowing or eating Hematology: -bleeding, -bruising  Musculoskeletal: -joint aches, -muscle aches, -joint swelling, -back pain, -neck pain, -cramping, -changes in gait Ophthalmology: denies vision changes, eye redness, itching, discharge Urology: -burning with urination, -difficulty urinating, -blood in urine, -urinary frequency, -urgency, -incontinence Neurology: -headache, -weakness, -tingling, -numbness, -memory loss, -falls, -dizziness Psychology: -depressed mood, -agitation, -sleep problems     Objective:   Physical  Exam  BP 120/82  Pulse 60  Temp(Src) 98 F (36.7 C) (Oral)  Resp 16  Ht 5\' 6"  (1.676 m)  Wt 168 lb (76.204 kg)  BMI 27.13 kg/m2  General appearance: alert, no distress, WD/WN, white female, looks stated age  Skin: right superior lateral orbit with 3mm raised lesion, uniform color, scattered lesions throughout, some appear to be benign solar keratosis including right cheek, some appear to be seborrheic keratosis on back, and a few on chest possibly actinic keratosis  HEENT: normocephalic, conjunctiva/corneas normal, sclerae anicteric, PERRLA, EOMi, nares patent, no discharge or erythema, pharynx normal  Oral cavity: MMM, tongue normal, teeth in good repair  Neck: supple, no lymphadenopathy, no thyromegaly, no masses, normal ROM, no bruits  Chest: non tender, normal shape and expansion  Heart: RRR, normal S1, S2, no murmurs  Lungs: CTA bilaterally, no wheezes, rhonchi, or rales  Abdomen: +bs, soft, nontender, non distended, no masses, no hepatomegaly, no splenomegaly, no bruits  Back: non tender, normal ROM, no scoliosis  Musculoskeletal: upper extremities non tender, no obvious  deformity, normal ROM throughout, lower extremities non tender, no obvious deformity, normal ROM throughout  Extremities: moderate varicosities left lower leg, otherwise no edema, no cyanosis, no clubbing  Pulses: 2+ symmetric, upper and lower extremities, normal cap refill  Neurological: alert, oriented x 3, CN2-12 intact, strength normal upper extremities and lower extremities, sensation normal throughout, DTRs 2+ throughout, no cerebellar signs, gait normal  Psychiatric: normal affect, behavior normal, pleasant  Breast/ GU: deferred to gynecology   Assessment and Plan :    Encounter Diagnoses  Name Primary?  . Routine general medical examination at a health care facility Yes  . Essential hypertension, benign   . Hyperlipidemia   . Migraines   . Insomnia   . Unspecified asthma(493.90)   . Allergic  rhinitis      Physical exam - discussed healthy lifestyle, diet, exercise, preventative care, vaccinations, and addressed their concerns.  Handout given. HTN - controlled on current medication Hyperlipidemia - pending labs, plan to begin Statin.  She never started statin last year.  Discussed diet, exercise. Migraines - minimal headache problems in teh last 12 months.  Has imitrex for prn use. Insominia - trial of Trazodone, we will try and wean back on Ambien. Asthma - rare flare up in the last 12 months.  Has albuterol for prn use.  Allergies - add nasal saline flush, begin sample of Qnasal, OTC antihistamine for current symptoms.   Follow-up pending labs.

## 2013-03-28 ENCOUNTER — Other Ambulatory Visit: Payer: Self-pay | Admitting: Medical

## 2013-03-28 ENCOUNTER — Encounter: Payer: Self-pay | Admitting: Medical

## 2013-03-28 MED ORDER — ATORVASTATIN CALCIUM 20 MG PO TABS: 20.0000 mg | ORAL_TABLET | Freq: Every day | ORAL | Status: DC

## 2013-03-30 ENCOUNTER — Encounter: Payer: Self-pay | Admitting: Medical

## 2013-04-14 ENCOUNTER — Telehealth: Payer: Self-pay | Admitting: Medical

## 2013-04-18 NOTE — Telephone Encounter (Signed)
pls call pt and inquire about the medication problems, what else has she been on prior?

## 2013-04-19 NOTE — Telephone Encounter (Signed)
Patient states that the Lipitor was causing her feel naused. She states that she took the medication for 1 week but she has stop taking it. She said she takes Ambien at night for sleep so she tried to take the Lipitor at night but something is keeping her up so she believes it is the Lipitor. Do you have any suggestions? CLS

## 2013-04-20 NOTE — Telephone Encounter (Signed)
Lets have her wait a week, then try starting back Lipitor at 1/2 tablet every other night.   Maybe take sometime between dinner and bedtime.  Then call back in 2wk.

## 2013-04-21 NOTE — Telephone Encounter (Signed)
Patient is aware of Shelby Covey PA-C recommendation on her medication. CLS

## 2013-07-13 ENCOUNTER — Telehealth: Payer: Self-pay | Admitting: Medical

## 2013-07-14 ENCOUNTER — Other Ambulatory Visit: Payer: Self-pay | Admitting: Medical

## 2013-07-14 DIAGNOSIS — Z0000 Encounter for general adult medical examination without abnormal findings: Secondary | ICD-10-CM

## 2013-07-14 MED ORDER — ACIDOPHILUS PROBIOTIC BLEND PO CAPS: 1.0000 | ORAL_CAPSULE | Freq: Three times a day (TID) | ORAL | Status: DC

## 2013-07-17 NOTE — Telephone Encounter (Signed)
Done

## 2013-07-26 ENCOUNTER — Other Ambulatory Visit: Payer: Self-pay | Admitting: Medical

## 2013-07-26 NOTE — Telephone Encounter (Signed)
Is this okay to refill? 

## 2013-10-30 ENCOUNTER — Other Ambulatory Visit: Payer: Self-pay | Admitting: Medical

## 2013-10-30 NOTE — Telephone Encounter (Signed)
Is this okay to refill? 

## 2013-11-22 ENCOUNTER — Ambulatory Visit (INDEPENDENT_AMBULATORY_CARE_PROVIDER_SITE_OTHER): Payer: No Typology Code available for payment source | Admitting: Medical

## 2013-11-22 ENCOUNTER — Telehealth: Payer: Self-pay | Admitting: Medical

## 2013-11-22 ENCOUNTER — Encounter: Payer: Self-pay | Admitting: Medical

## 2013-11-22 VITALS — BP 124/74 | HR 60 | Temp 97.9°F | Resp 12 | Ht 68.0 in | Wt 163.0 lb

## 2013-11-22 DIAGNOSIS — K529 Noninfective gastroenteritis and colitis, unspecified: Secondary | ICD-10-CM

## 2013-11-22 DIAGNOSIS — J029 Acute pharyngitis, unspecified: Secondary | ICD-10-CM

## 2013-11-22 DIAGNOSIS — B349 Viral infection, unspecified: Secondary | ICD-10-CM

## 2013-11-22 DIAGNOSIS — R5383 Other fatigue: Secondary | ICD-10-CM

## 2013-11-22 DIAGNOSIS — R197 Diarrhea, unspecified: Secondary | ICD-10-CM

## 2013-11-22 DIAGNOSIS — B9789 Other viral agents as the cause of diseases classified elsewhere: Secondary | ICD-10-CM

## 2013-11-22 DIAGNOSIS — Z2089 Contact with and (suspected) exposure to other communicable diseases: Secondary | ICD-10-CM

## 2013-11-22 DIAGNOSIS — Z20818 Contact with and (suspected) exposure to other bacterial communicable diseases: Secondary | ICD-10-CM

## 2013-11-22 DIAGNOSIS — R5381 Other malaise: Secondary | ICD-10-CM

## 2013-11-22 LAB — CBC WITH DIFFERENTIAL/PLATELET
Basophils Absolute: 0 10*3/uL (ref 0.0–0.1)
Basophils Relative: 0 % (ref 0–1)
EOS PCT: 1 % (ref 0–5)
Eosinophils Absolute: 0 10*3/uL (ref 0.0–0.7)
HCT: 34.6 % — ABNORMAL LOW (ref 36.0–46.0)
Hemoglobin: 11.9 g/dL — ABNORMAL LOW (ref 12.0–15.0)
LYMPHS ABS: 1.2 10*3/uL (ref 0.7–4.0)
Lymphocytes Relative: 30 % (ref 12–46)
MCH: 30.2 pg (ref 26.0–34.0)
MCHC: 34.4 g/dL (ref 30.0–36.0)
MCV: 87.8 fL (ref 78.0–100.0)
MONO ABS: 0.4 10*3/uL (ref 0.1–1.0)
Monocytes Relative: 10 % (ref 3–12)
NEUTROS ABS: 2.4 10*3/uL (ref 1.7–7.7)
Neutrophils Relative %: 59 % (ref 43–77)
Platelets: 160 10*3/uL (ref 150–400)
RBC: 3.94 MIL/uL (ref 3.87–5.11)
RDW: 14.2 % (ref 11.5–15.5)
WBC: 4 10*3/uL (ref 4.0–10.5)

## 2013-11-22 MED ORDER — ZOLPIDEM TARTRATE 10 MG PO TABS: 10.0000 mg | ORAL_TABLET | Freq: Every evening | ORAL | Status: DC | PRN

## 2013-11-22 MED ORDER — ONDANSETRON HCL 4 MG PO TABS: 4.0000 mg | ORAL_TABLET | Freq: Three times a day (TID) | ORAL | Status: DC | PRN

## 2013-11-22 NOTE — Telephone Encounter (Signed)
What are the instructions for ambien?

## 2013-11-22 NOTE — Telephone Encounter (Signed)
Have her stop this temporarily for one to 2 weeks and let us see if the diarrhea goes away. In the meantime, he may call out Ambien 10 mg, #20 no refill. Please put this in Epic as well

## 2013-11-22 NOTE — Telephone Encounter (Signed)
ambien called in  

## 2013-11-22 NOTE — Progress Notes (Signed)
Subjective:    Patient ID: Shelby Randolph, female    DOB: [birth year], 63 y.o.   MRN: [id number]  HPI  Patient is a Engineer, mining, works hospice, presenting today for headache, earache and general malaise since Saturday. Associated symptoms include  cough, hoarseness, throat pain, difficulty breathing when she does her regular walks, some wheezing, interrupted sleep. She also states that she got up today and had vomiting, diarrhea - no blood in either. She has tried Tylenol with some relief. Denies fevers, abdominal pain, chest pain, pleuritic pain, sinus pressure, itchy watery eyes, difficulty swallowing, dysuria. Of note, she does have allergies, takes Zyrtec everyday, has asthma but has not used her inhaler. She also has a history of being prone to respiratory infections, including pneumonia. She has also had sick contacts recently, son had Strep throat 3 weeks ago and daughter was sick last weekend.   Patient also reports daily GI symptoms for years. Reports that she feels "yucky" every time she eats, symptoms include nausea and intermittent diarrhea. She has changed her diet dramatically in order to try and resolve her symptoms including cutting out red meat, chicken, meats in general, starchy heavy foods. She is now eating salads, using probiotics, some beans and other light carbohydrates but continues to have ongoing symptoms. Patient reports that she has seen a nutritionist, has been worked up with colonoscopy without finding any significant sources for her GI symptoms. She has had an EGD done previously but states that it was years ago. Patient also takes Trazodone nightly for sleep.  No other aggravating or relieving factors.  Review of Systems As in subjective.    Objective:   Physical Exam  BP 124/74  Pulse 60  Temp(Src) 97.9 F (36.6 C)  Resp 12  Ht 5\' 8"  (1.727 m)  Wt 163 lb (73.936 kg)  BMI 24.79 kg/m2  General appearance: alert, no distress, WD/WN, mildly ill  appearing HEENT: normocephalic, sclerae anicteric, conjunctiva pink and moist, TMs pearly, nares patent, no discharge or erythema, mild-moderate post-nasal drip otherwise pharynx normal, tonsils unremarkable Oral cavity: MMM, no lesions Neck: supple, no lymphadenopathy, no thyromegaly, no masses Heart: RRR, normal S1, S2, no murmurs Lungs: CTA bilaterally, no wheezes, rhonchi, or rales Abdomen: +bs, soft, mild generalized tenderness, no mass, no organomegaly Pulses: 2+ symmetric     Assessment & Plan:   Encounter Diagnoses  Name Primary?  . Viral syndrome Yes  . Sore throat   . Strep throat exposure   . Chronic diarrhea   . Other malaise and fatigue    We discussed her respiratory symptoms and concerns.  Rapid strep negative today, symptoms likely viral in etiology given symptoms and PE findings. Advised patient to take Mucinex DM, stay hydrated, rest. Please call back if symptoms not resolved by Friday. Use inhaler as needed. CBC today.  Chronic diarrhea-discussed possible causes. Of note Dr. Earlean Shawl did a colonoscopy on her within the past year with nothing abnormal per her report.  Discussed moving forward with potential food allergy lab panel, celiac disease screen, possible referral back to GI for upper endoscopy. She is eating a limited variety of healthy foods including beans and vegetables and rice, some bread, but no animal products. She's been working with a dietitian for months to help figure this out. She does already take a probiotic regularly  She will also call back and let him know what sleep aid she is using we may cut back on the sleep aid temporarily to see if this  helps with the diarrhea as a side effect.  Patient was seen in conjunction with PA student Jaynee Eagles, and I have also evaluated and examined patient, agree with student's notes, student supervised by me.

## 2013-12-11 ENCOUNTER — Other Ambulatory Visit: Payer: Self-pay | Admitting: Medical

## 2013-12-11 NOTE — Telephone Encounter (Signed)
Call out the [facility]

## 2013-12-11 NOTE — Telephone Encounter (Signed)
Is this okay to call in? 

## 2013-12-11 NOTE — Telephone Encounter (Signed)
Called in ambien to pharmacy

## 2014-01-01 ENCOUNTER — Other Ambulatory Visit: Payer: Self-pay | Admitting: Medical

## 2014-01-01 NOTE — Telephone Encounter (Signed)
Call out the medication.  See if she is improved from last visit?

## 2014-01-01 NOTE — Telephone Encounter (Signed)
I called out Ambien to her pharmacy. CLS

## 2014-01-01 NOTE — Telephone Encounter (Signed)
Is this okay to refill? 

## 2014-01-20 ENCOUNTER — Other Ambulatory Visit: Payer: Self-pay | Admitting: Medical

## 2014-01-22 NOTE — Telephone Encounter (Signed)
pls call it out 

## 2014-01-22 NOTE — Telephone Encounter (Signed)
IS THIS OKAY TO REFILL 

## 2014-01-22 NOTE — Telephone Encounter (Signed)
Pt also called requesting refill

## 2014-01-23 NOTE — Telephone Encounter (Signed)
I called out Ambien to her pharmacy per Chana Bode PAC. CLS

## 2014-02-12 ENCOUNTER — Other Ambulatory Visit: Payer: Self-pay | Admitting: Medical

## 2014-02-12 NOTE — Telephone Encounter (Signed)
I called out her medication to the pharmacy per Chana Bode [facility]. CLS

## 2014-02-12 NOTE — Telephone Encounter (Signed)
IS THIS OKAY TO REFILL 

## 2014-02-12 NOTE — Telephone Encounter (Signed)
Call out with refills through til physical in 03/2014.

## 2014-03-11 DIAGNOSIS — I700 Atherosclerosis of aorta: Secondary | ICD-10-CM

## 2014-03-11 HISTORY — DX: Atherosclerosis of aorta: I70.0

## 2014-03-22 ENCOUNTER — Other Ambulatory Visit (HOSPITAL_COMMUNITY): Payer: Self-pay | Admitting: Obstetrics and Gynecology

## 2014-03-22 DIAGNOSIS — R1011 Right upper quadrant pain: Principal | ICD-10-CM

## 2014-03-22 DIAGNOSIS — G8929 Other chronic pain: Secondary | ICD-10-CM

## 2014-03-23 ENCOUNTER — Other Ambulatory Visit: Payer: Self-pay | Admitting: Obstetrics and Gynecology

## 2014-03-23 DIAGNOSIS — R928 Other abnormal and inconclusive findings on diagnostic imaging of breast: Secondary | ICD-10-CM

## 2014-03-26 ENCOUNTER — Encounter: Payer: No Typology Code available for payment source | Admitting: Medical

## 2014-03-28 ENCOUNTER — Ambulatory Visit (HOSPITAL_COMMUNITY)
Admission: RE | Admit: 2014-03-28 | Discharge: 2014-03-28 | Disposition: A | Discharge status: Home / Self Care | Payer: No Typology Code available for payment source | Source: Ambulatory Visit | Attending: Obstetrics and Gynecology | Admitting: Obstetrics and Gynecology

## 2014-03-28 DIAGNOSIS — R1011 Right upper quadrant pain: Secondary | ICD-10-CM | POA: Insufficient documentation

## 2014-03-28 DIAGNOSIS — K8020 Calculus of gallbladder without cholecystitis without obstruction: Secondary | ICD-10-CM | POA: Insufficient documentation

## 2014-03-28 DIAGNOSIS — G8929 Other chronic pain: Secondary | ICD-10-CM | POA: Insufficient documentation

## 2014-03-29 ENCOUNTER — Ambulatory Visit
Admission: RE | Admit: 2014-03-29 | Discharge: 2014-03-29 | Disposition: A | Discharge status: Home / Self Care | Payer: No Typology Code available for payment source | Source: Ambulatory Visit | Attending: Obstetrics and Gynecology | Admitting: Obstetrics and Gynecology

## 2014-03-29 DIAGNOSIS — R928 Other abnormal and inconclusive findings on diagnostic imaging of breast: Secondary | ICD-10-CM

## 2014-04-09 ENCOUNTER — Other Ambulatory Visit: Payer: BC Managed Care – PPO

## 2014-04-12 ENCOUNTER — Ambulatory Visit (INDEPENDENT_AMBULATORY_CARE_PROVIDER_SITE_OTHER): Payer: No Typology Code available for payment source | Admitting: Medical

## 2014-04-12 ENCOUNTER — Encounter: Payer: Self-pay | Admitting: Medical

## 2014-04-12 ENCOUNTER — Other Ambulatory Visit: Payer: Self-pay | Admitting: Medical

## 2014-04-12 VITALS — BP 120/80 | HR 52 | Temp 97.7°F | Resp 14 | Ht 66.0 in | Wt 157.0 lb

## 2014-04-12 DIAGNOSIS — K529 Noninfective gastroenteritis and colitis, unspecified: Secondary | ICD-10-CM

## 2014-04-12 DIAGNOSIS — I10 Essential (primary) hypertension: Secondary | ICD-10-CM | POA: Insufficient documentation

## 2014-04-12 DIAGNOSIS — I700 Atherosclerosis of aorta: Secondary | ICD-10-CM | POA: Insufficient documentation

## 2014-04-12 DIAGNOSIS — R110 Nausea: Secondary | ICD-10-CM

## 2014-04-12 DIAGNOSIS — A6000 Herpesviral infection of urogenital system, unspecified: Secondary | ICD-10-CM | POA: Insufficient documentation

## 2014-04-12 DIAGNOSIS — Z0000 Encounter for general adult medical examination without abnormal findings: Secondary | ICD-10-CM

## 2014-04-12 DIAGNOSIS — G4700 Insomnia, unspecified: Secondary | ICD-10-CM

## 2014-04-12 DIAGNOSIS — Z86010 Personal history of colonic polyps: Secondary | ICD-10-CM

## 2014-04-12 DIAGNOSIS — J4520 Mild intermittent asthma, uncomplicated: Secondary | ICD-10-CM

## 2014-04-12 DIAGNOSIS — J309 Allergic rhinitis, unspecified: Secondary | ICD-10-CM

## 2014-04-12 DIAGNOSIS — R1011 Right upper quadrant pain: Secondary | ICD-10-CM | POA: Insufficient documentation

## 2014-04-12 DIAGNOSIS — E785 Hyperlipidemia, unspecified: Secondary | ICD-10-CM

## 2014-04-12 DIAGNOSIS — K8020 Calculus of gallbladder without cholecystitis without obstruction: Secondary | ICD-10-CM

## 2014-04-12 LAB — CBC WITH DIFFERENTIAL/PLATELET
BASOS ABS: 0 10*3/uL (ref 0.0–0.1)
BASOS PCT: 1 % (ref 0–1)
EOS ABS: 0 10*3/uL (ref 0.0–0.7)
Eosinophils Relative: 1 % (ref 0–5)
HCT: 33.8 % — ABNORMAL LOW (ref 36.0–46.0)
Hemoglobin: 11.7 g/dL — ABNORMAL LOW (ref 12.0–15.0)
Lymphocytes Relative: 36 % (ref 12–46)
Lymphs Abs: 1 10*3/uL (ref 0.7–4.0)
MCH: 30.5 pg (ref 26.0–34.0)
MCHC: 34.6 g/dL (ref 30.0–36.0)
MCV: 88 fL (ref 78.0–100.0)
MPV: 9.1 fL — AB (ref 9.4–12.4)
Monocytes Absolute: 0.3 10*3/uL (ref 0.1–1.0)
Monocytes Relative: 10 % (ref 3–12)
NEUTROS ABS: 1.5 10*3/uL — AB (ref 1.7–7.7)
Neutrophils Relative %: 52 % (ref 43–77)
PLATELETS: 143 10*3/uL — AB (ref 150–400)
RBC: 3.84 MIL/uL — ABNORMAL LOW (ref 3.87–5.11)
RDW: 13.7 % (ref 11.5–15.5)
WBC: 2.8 10*3/uL — AB (ref 4.0–10.5)

## 2014-04-12 LAB — COMPREHENSIVE METABOLIC PANEL
ALBUMIN: 3.8 g/dL (ref 3.5–5.2)
ALT: 11 U/L (ref 0–35)
AST: 20 U/L (ref 0–37)
Alkaline Phosphatase: 52 U/L (ref 39–117)
BILIRUBIN TOTAL: 0.6 mg/dL (ref 0.2–1.2)
BUN: 14 mg/dL (ref 6–23)
CO2: 29 mEq/L (ref 19–32)
Calcium: 9.5 mg/dL (ref 8.4–10.5)
Chloride: 106 mEq/L (ref 96–112)
Creat: 0.68 mg/dL (ref 0.50–1.10)
Glucose, Bld: 92 mg/dL (ref 70–99)
POTASSIUM: 4.3 meq/L (ref 3.5–5.3)
Sodium: 141 mEq/L (ref 135–145)
TOTAL PROTEIN: 6.4 g/dL (ref 6.0–8.3)

## 2014-04-12 LAB — POCT URINALYSIS DIPSTICK
Bilirubin, UA: NEGATIVE
Blood, UA: NEGATIVE
Glucose, UA: NEGATIVE
Ketones, UA: NEGATIVE
LEUKOCYTES UA: NEGATIVE
NITRITE UA: NEGATIVE
PROTEIN UA: NEGATIVE
Spec Grav, UA: 1.02
UROBILINOGEN UA: NEGATIVE
pH, UA: 6

## 2014-04-12 LAB — LIPID PANEL
Cholesterol: 219 mg/dL — ABNORMAL HIGH (ref 0–200)
HDL: 64 mg/dL (ref >39)
LDL Cholesterol: 135 mg/dL — ABNORMAL HIGH (ref 0–99)
Total CHOL/HDL Ratio: 3.4 Ratio
Triglycerides: 100 mg/dL (ref <150)
VLDL: 20 mg/dL (ref 0–40)

## 2014-04-12 LAB — TSH: TSH: 1.644 u[IU]/mL (ref 0.350–4.500)

## 2014-04-12 MED ORDER — ALBUTEROL SULFATE HFA 108 (90 BASE) MCG/ACT IN AERS: 2.0000 | INHALATION_SPRAY | RESPIRATORY_TRACT | Status: DC | PRN

## 2014-04-12 MED ORDER — ATENOLOL 25 MG PO TABS: 25.0000 mg | ORAL_TABLET | Freq: Every day | ORAL | Status: DC

## 2014-04-12 MED ORDER — CETIRIZINE HCL 10 MG PO TABS: 10.0000 mg | ORAL_TABLET | Freq: Every day | ORAL | Status: DC

## 2014-04-12 MED ORDER — ONDANSETRON HCL 4 MG PO TABS: 4.0000 mg | ORAL_TABLET | Freq: Three times a day (TID) | ORAL | Status: DC | PRN

## 2014-04-12 MED ORDER — ACYCLOVIR 400 MG PO TABS: ORAL_TABLET | ORAL | Status: DC

## 2014-04-12 MED ORDER — HYDROCHLOROTHIAZIDE 12.5 MG PO CAPS: 12.5000 mg | ORAL_CAPSULE | Freq: Once | ORAL | Status: DC

## 2014-04-12 NOTE — Addendum Note (Signed)
Addended by: Carlena Hurl on: 04/12/2014 09:44 AM   Modules accepted: Orders

## 2014-04-12 NOTE — Progress Notes (Signed)
Subjective:   HPI  Shelby Randolph is a 63 y.o. female who presents for a complete physical.  Medical care team includes:  Dr. Tonia Brooms, Dermatology  Dr. Earlean Shawl, Gastroenterology  Dr. Radene Knee, Gynecology  Dr. Loreta Ave, Ophthalmology  Dentist  Dorothea Ogle, PA-C primary care here  Preventative care: Last ophthalmology visit:yes Dr. Sabra Heck 02/2014 Last dental visit:yes last seen 04/11/14 Last colonoscopy: 2014 Last mammogram:03/2014 Last gynecological exam:03/2014 Last EKG:12/2011 Last labs: today  Prior vaccinations: TD or OMVE:7209 Influenza:02/2014 received at work Pneumococcal: 01/2012 Shingles/Zostavax:03/2011  Concerns: She notes several months of right upper quadrant pain, chronic nausea, chronic diarrhea, had ultrasound recently for gynecology showing gallstones. She has a consult with general surgery today regarding the gallbladder  Reviewed their medical, surgical, family, social, medication, and allergy history and updated chart as appropriate.  Past Medical History  Diagnosis Date  . Insomnia   . Anxiety   . Migraines     Dr. Melton Alar, migraines  . Allergy   . Asthma   . Nearsightedness     wears glasses  . Eye injury     in college, some pertinent loss of vision in left eye  . Genital herpes   . Hypertension 12/2011  . History of mammogram     Dr. Radene Knee  . Routine gynecological examination     Dr. Radene Knee  . History of bone density study 09/2011    normal  . History of PFTs 01/20/2011    moderate airflow limitation, no significant response to bronchodilator  . Colon polyp   . Diverticulosis   . History of melanoma     sees [facility], dermatology  . Atherosclerosis of aorta 03/2014    abnormal on ultrasound, repeat US within 5 years    Past Surgical History  Procedure Laterality Date  . Rotator cuff repair  05/2010    RIGHT  . Tonsillectomy    . Polypectomy      colon, precancerous  . Colonoscopy  10/2012    Dr. Earlean Shawl  . Varicose vein  surgery      bilat  . Vaginal hysterectomy  1998    total; abnormal peroids    History   Social History  . Marital Status: Widowed    Spouse Name: N/A    Number of Children: N/A  . Years of Education: N/A   Occupational History  . compliance agent Investment banker, corporate) [place]    Nurse Practitioner   Social History Main Topics  . Smoking status: Never Smoker   . Smokeless tobacco: Never Used  . Alcohol Use: 1.2 oz/week    2 Glasses of wine per week  . Drug Use: No  . Sexual Activity: Not on file     Comment: exercise - walks, [place], cardio at gym 3 x/wk, yoga, widowed    Other Topics Concern  . 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 COPD Mother     died age 95yo.  emphysema  . Cancer Father 60    colon cancer; died age 49yo  . Cancer Brother 26    colon cancer  . Cancer Maternal Aunt     breast  . Diabetes Neg Hx   .  Heart disease Neg Hx   . Hypertension Neg Hx   . Stroke Neg Hx     Current outpatient prescriptions: acetaminophen (TYLENOL) 500 MG tablet, 1-2 tablets twice to three times daily as needed., Disp: 100 tablet, Rfl: 5;  acyclovir (ZOVIRAX) 400 MG tablet, Take 1 tablet (400 mg total) by mouth once., Disp: 30 tablet, Rfl: 0;  albuterol (PROVENTIL HFA;VENTOLIN HFA) 108 (90 BASE) MCG/ACT inhaler, Inhale 2 puffs into the lungs as needed., Disp: 1 Inhaler, Rfl: 0 atenolol (TENORMIN) 25 MG tablet, Take 1 tablet (25 mg total) by mouth daily., Disp: 90 tablet, Rfl: 3;  cetirizine (ZYRTEC) 10 MG tablet, Take 1 tablet (10 mg total) by mouth daily., Disp: 90 tablet, Rfl: 3;  hydrochlorothiazide (MICROZIDE) 12.5 MG capsule, Take 1 capsule (12.5 mg total) by  mouth once., Disp: 90 capsule, Rfl: 3 ondansetron (ZOFRAN) 4 MG tablet, Take 1 tablet (4 mg total) by mouth every 8 (eight) hours as needed for nausea or vomiting., Disp: 20 tablet, Rfl: 0;  zolpidem (AMBIEN) 10 MG tablet, TAKE 1 TABLET AT NIGHT AS NEEDED FOR SLEEP, Disp: 20 tablet, Rfl: 2 Current facility-administered medications: albuterol (PROVENTIL) (5 MG/ML) 0.5% nebulizer solution 2.5 mg, 2.5 mg, Nebulization, Once, Rita Ohara, MD  Allergies  Allergen Reactions  . Mucinex [Guaifenesin Er]     jittery  . Sulfonamide Derivatives        Review of Systems Constitutional: -fever, -chills, -sweats, -unexpected weight change, -decreased appetite, -fatigue Allergy: -sneezing, -itching, -congestion Dermatology: -changing moles, --rash, -lumps ENT: -runny nose, -ear pain, -sore throat, -hoarseness, -sinus pain, -teeth pain, - ringing in ears, -hearing loss, -nosebleeds Cardiology: -chest pain, -palpitations, -swelling, -difficulty breathing when lying flat, -waking up short of breath Respiratory: -cough, -shortness of breath, -difficulty breathing with exercise or exertion, -wheezing, -coughing up blood Gastroenterology: +abdominal pain, +nausea, -vomiting, +diarrhea, -constipation, +occasional blood in stool, +changes in bowel movement, -difficulty swallowing or eating Hematology: -bleeding, -bruising  Musculoskeletal: -joint aches, -muscle aches, -joint swelling, -back pain, -neck pain, -cramping, -changes in gait Ophthalmology: denies vision changes, eye redness, itching, discharge Urology: -burning with urination, -difficulty urinating, -blood in urine, -urinary frequency, -urgency, -incontinence Neurology: -headache, -weakness, -tingling, -numbness, -memory loss, -falls, -dizziness Psychology: -depressed mood, -agitation, -sleep problems     Objective:   Physical Exam  BP 120/80 mmHg  Pulse 52  Temp(Src) 97.7 F (36.5 C) (Oral)  Resp 14  Ht 5\' 6"  (1.676 m)  Wt 157 lb (71.215  kg)  BMI 25.35 kg/m2  General appearance: alert, no distress, WD/WN, white female, looks stated age  Skin:  scattered lesions throughout, some appear to be benign solar keratosis including right cheek, some appear to be seborrheic keratosis on back, and a few on chest possibly actinic keratosis  HEENT: normocephalic, conjunctiva/corneas normal, sclerae anicteric, PERRLA, EOMi, nares patent, no discharge or erythema, pharynx normal  Oral cavity: MMM, tongue normal, teeth in good repair  Neck: supple, no lymphadenopathy, no thyromegaly, no masses, normal ROM, no bruits  Chest: non tender, normal shape and expansion  Heart: RRR, normal S1, S2, no murmurs  Lungs: CTA bilaterally, no wheezes, rhonchi, or rales  Abdomen: +bs, soft, RUQ tenderness,  Otherwise nontender, non distended, no masses, no hepatomegaly, no splenomegaly, no bruits  Back: non tender, normal ROM, no scoliosis  Musculoskeletal: upper extremities non tender, no obvious deformity, normal ROM throughout, lower extremities non tender, no obvious deformity, normal ROM throughout  Extremities: moderate varicosities left lower leg, otherwise no edema, no cyanosis, no clubbing  Pulses:  2+ symmetric, upper and lower extremities, normal cap refill  Neurological: alert, oriented x 3, CN2-12 intact, strength normal upper extremities and lower extremities, sensation normal throughout, DTRs 2+ throughout, no cerebellar signs, gait normal  Psychiatric: normal affect, behavior normal, pleasant  Breast/ GU: deferred to gynecology   Assessment and Plan :    Encounter Diagnoses  Name Primary?  . Encounter for health maintenance examination in adult Yes  . Right upper quadrant pain   . Chronic nausea   . Chronic diarrhea   . Gallstones   . Essential hypertension   . Dyslipidemia   . Asthma, mild intermittent, uncomplicated   . Insomnia   . Genital herpes   . History of colonic polyps   . Allergic rhinitis, unspecified  allergic rhinitis type   . Atherosclerosis of abdominal aorta     Physical exam - discussed healthy lifestyle, diet, exercise, preventative care, vaccinations, and addressed their concerns.  Handout given. Advised she have routine f/u with her other doctors yearly She will be due for pneumococcal 13 in 2016 Right upper quadrant pain, chronic nausea, chronic diarrhea, gallstones-has a consult with general surgery today for possible gallbladder removal. Discussed other potential causes of the symptoms. Labs today Hypertension-controlled on current medication, atenolol 25 mg daily, hydrochlorothiazide 12.5 mg daily Dyslipidemia-significant diet changes since last year including cutting out meat and dairy, eating healthy diet, labs today. She did not tolerate Lipitor due to muscle aches and nausea Asthma, mild intermittent, when necessary use of albuterol Insomnia-seems to do well on Ambien daily History of genital herpes - takes Acyclovir prn Colon polyps-Dr. Earlean Shawl, has repeat colonoscopy planned within 3 years due to abnormal polyps on 2014 colonoscopy Allergic rhinitis - controlled on current medication Abdominal aortic atherosclerosis and at risk for aneurysm per abdominal ultrasound 03/2014 - recheck lipid this today, discussed importance of blood pressure control which is controlled, plan to repeat aortic ultrasound of the abdomen within 5 years Follow-up pending labs

## 2014-04-12 NOTE — Telephone Encounter (Signed)
Is this okay to refill? 

## 2014-04-12 NOTE — Addendum Note (Signed)
Addended by: Carlena Hurl on: 04/12/2014 01:39 PM   Modules accepted: Orders

## 2014-04-12 NOTE — Telephone Encounter (Signed)
Call out [facility] with 5 refills

## 2014-04-13 ENCOUNTER — Other Ambulatory Visit: Payer: Self-pay | Admitting: Family Medicine

## 2014-04-13 LAB — GLIA (IGA/G) + TTG IGA
Gliadin IgA: 4 U/mL (ref <20)
Gliadin IgG: 3 U/mL (ref <20)
Tissue Transglutaminase Ab, IgA: 1 U/mL (ref <4)

## 2014-04-13 LAB — VITAMIN D 25 HYDROXY (VIT D DEFICIENCY, FRACTURES): Vit D, 25-Hydroxy: 30 ng/mL (ref 30–100)

## 2014-04-16 ENCOUNTER — Encounter: Payer: Self-pay | Admitting: Medical

## 2014-04-16 NOTE — Telephone Encounter (Signed)
Medication has already been called out and ordered in the system

## 2014-04-17 ENCOUNTER — Encounter: Payer: Self-pay | Admitting: Internal Medicine

## 2014-04-25 MED ORDER — ZOLPIDEM TARTRATE 10 MG PO TABS: 10.0000 mg | ORAL_TABLET | Freq: Every evening | ORAL | Status: DC | PRN

## 2014-04-30 ENCOUNTER — Other Ambulatory Visit: Payer: No Typology Code available for payment source

## 2014-04-30 DIAGNOSIS — R899 Unspecified abnormal finding in specimens from other organs, systems and tissues: Secondary | ICD-10-CM

## 2014-04-30 LAB — CBC WITH DIFFERENTIAL/PLATELET
Basophils Absolute: 0 10*3/uL (ref 0.0–0.1)
Basophils Relative: 1 % (ref 0–1)
Eosinophils Absolute: 0 10*3/uL (ref 0.0–0.7)
Eosinophils Relative: 2 % (ref 0–5)
HEMATOCRIT: 32.8 % — AB (ref 36.0–46.0)
HEMOGLOBIN: 11.3 g/dL — AB (ref 12.0–15.0)
Lymphocytes Relative: 41 % (ref 12–46)
Lymphs Abs: 1 10*3/uL (ref 0.7–4.0)
MCH: 30.2 pg (ref 26.0–34.0)
MCHC: 34.5 g/dL (ref 30.0–36.0)
MCV: 87.7 fL (ref 78.0–100.0)
MONOS PCT: 9 % (ref 3–12)
MPV: 9 fL — ABNORMAL LOW (ref 9.4–12.4)
Monocytes Absolute: 0.2 10*3/uL (ref 0.1–1.0)
NEUTROS ABS: 1.1 10*3/uL — AB (ref 1.7–7.7)
Neutrophils Relative %: 47 % (ref 43–77)
Platelets: 137 10*3/uL — ABNORMAL LOW (ref 150–400)
RBC: 3.74 MIL/uL — ABNORMAL LOW (ref 3.87–5.11)
RDW: 13.6 % (ref 11.5–15.5)
WBC: 2.4 10*3/uL — AB (ref 4.0–10.5)

## 2014-05-03 ENCOUNTER — Telehealth: Payer: Self-pay | Admitting: Oncology

## 2014-05-03 NOTE — Telephone Encounter (Signed)
S/W PATIENT AND GAVE NP APPT FOR 1/13 @ 1:30 W/DR. SHADAD REFERRING DR. Shanon Brow TYSINGER DX- EVAL FOR ABNL LAST TEST ON CBC

## 2014-05-11 HISTORY — PX: COLONOSCOPY: SHX174

## 2014-05-19 ENCOUNTER — Other Ambulatory Visit: Payer: Self-pay | Admitting: Oncology

## 2014-05-19 DIAGNOSIS — D61818 Other pancytopenia: Secondary | ICD-10-CM

## 2014-05-23 ENCOUNTER — Ambulatory Visit (HOSPITAL_BASED_OUTPATIENT_CLINIC_OR_DEPARTMENT_OTHER): Payer: No Typology Code available for payment source | Admitting: Oncology

## 2014-05-23 ENCOUNTER — Ambulatory Visit: Payer: PRIVATE HEALTH INSURANCE

## 2014-05-23 ENCOUNTER — Other Ambulatory Visit (HOSPITAL_BASED_OUTPATIENT_CLINIC_OR_DEPARTMENT_OTHER): Payer: PRIVATE HEALTH INSURANCE

## 2014-05-23 VITALS — BP 136/69 | HR 60 | Temp 97.9°F | Resp 18 | Ht 66.0 in | Wt 158.7 lb

## 2014-05-23 DIAGNOSIS — D61818 Other pancytopenia: Secondary | ICD-10-CM

## 2014-05-23 LAB — CBC WITH DIFFERENTIAL/PLATELET
BASO%: 0.7 % (ref 0.0–2.0)
BASOS ABS: 0 10*3/uL (ref 0.0–0.1)
EOS%: 1 % (ref 0.0–7.0)
Eosinophils Absolute: 0 10*3/uL (ref 0.0–0.5)
HEMATOCRIT: 36.8 % (ref 34.8–46.6)
HGB: 12.1 g/dL (ref 11.6–15.9)
LYMPH#: 1.4 10*3/uL (ref 0.9–3.3)
LYMPH%: 34 % (ref 14.0–49.7)
MCH: 29.8 pg (ref 25.1–34.0)
MCHC: 33 g/dL (ref 31.5–36.0)
MCV: 90.4 fL (ref 79.5–101.0)
MONO#: 0.4 10*3/uL (ref 0.1–0.9)
MONO%: 9.9 % (ref 0.0–14.0)
NEUT#: 2.3 10*3/uL (ref 1.5–6.5)
NEUT%: 54.4 % (ref 38.4–76.8)
Platelets: 179 10*3/uL (ref 145–400)
RBC: 4.07 10*6/uL (ref 3.70–5.45)
RDW: 13.7 % (ref 11.2–14.5)
WBC: 4.2 10*3/uL (ref 3.9–10.3)

## 2014-05-23 LAB — CHCC SMEAR

## 2014-05-23 LAB — COMPREHENSIVE METABOLIC PANEL (CC13)
ALT: 16 U/L (ref 0–55)
AST: 29 U/L (ref 5–34)
Albumin: 4.3 g/dL (ref 3.5–5.0)
Alkaline Phosphatase: 66 U/L (ref 40–150)
Anion Gap: 11 mEq/L (ref 3–11)
BUN: 20.7 mg/dL (ref 7.0–26.0)
CHLORIDE: 102 meq/L (ref 98–109)
CO2: 28 mEq/L (ref 22–29)
CREATININE: 0.8 mg/dL (ref 0.6–1.1)
Calcium: 9.5 mg/dL (ref 8.4–10.4)
EGFR: 74 mL/min/{1.73_m2} — ABNORMAL LOW (ref >90)
GLUCOSE: 89 mg/dL (ref 70–140)
Potassium: 4.1 mEq/L (ref 3.5–5.1)
Sodium: 142 mEq/L (ref 136–145)
TOTAL PROTEIN: 7.1 g/dL (ref 6.4–8.3)
Total Bilirubin: 0.69 mg/dL (ref 0.20–1.20)

## 2014-05-23 NOTE — Consult Note (Signed)
Reason for Referral: Mild pancytopenia.   HPI: 64 year old woman currently of [place] where she lived since the 77s. She is a rather healthy woman with history of asthma allergy and recently developed intermittent mild diarrhea. She had a CBC done by her primary care provider on 04/12/2014. At that time with her count was 2.8, hemoglobin 11.7 and a platelet count of 143. The differential was normal at that time. Repeat CBC on 04/30/2014 showed a white cell count of 2.4, hemoglobin 11.3 and platelet count 137. This on these findings she was referred to me for evaluation. Clinically she is not symptomatic at this time. She didn't report loose bowel habits as mentioned. She does report intermittent fatigue but still able to function properly. She continues to work full-time and exercises regularly. She has not reported any headaches, blurry vision, double vision or syncope. She does not report any fevers, chills, sweats or appetite changes. She does have weight loss that is intentional. She does not report any chest pain, shortness of breath, cough, hemoptysis or hematemesis. She does not report any palpitation, leg edema or wheezing. She does not report any nausea, vomiting, abdominal pain she does not report any hematochezia or melena. Does not report any frequency urgency or hesitancy.   Past Medical History  Diagnosis Date  . Insomnia   . Anxiety   . Migraines     Dr. Melton Alar, migraines  . Allergy   . Asthma   . Nearsightedness     wears glasses  . Eye injury     in college, some pertinent loss of vision in left eye  . Genital herpes   . Hypertension 12/2011  . History of mammogram     Dr. Radene Knee  . Routine gynecological examination     Dr. Radene Knee  . History of bone density study 09/2011    normal  . History of PFTs 01/20/2011    moderate airflow limitation, no significant response to bronchodilator  . Colon polyp   . Diverticulosis   . History of melanoma     sees [facility],  dermatology  . Atherosclerosis of aorta 03/2014    abnormal on ultrasound, repeat US within 5 years  :  Past Surgical History  Procedure Laterality Date  . Rotator cuff repair  05/2010    RIGHT  . Tonsillectomy    . Polypectomy      colon, precancerous  . Colonoscopy  10/2012    Dr. Earlean Shawl  . Varicose vein surgery      bilat  . Vaginal hysterectomy  1998    total; abnormal peroids  :   Current outpatient prescriptions:  .  acetaminophen (TYLENOL) 500 MG tablet, 1-2 tablets twice to three times daily as needed., Disp: 100 tablet, Rfl: 5 .  acyclovir (ZOVIRAX) 400 MG tablet, BID for suppression or TID x 5 days for flare up, Disp: 90 tablet, Rfl: 2 .  albuterol (PROVENTIL HFA;VENTOLIN HFA) 108 (90 BASE) MCG/ACT inhaler, Inhale 2 puffs into the lungs as needed., Disp: 1 Inhaler, Rfl: 0 .  atenolol (TENORMIN) 25 MG tablet, Take 1 tablet (25 mg total) by mouth daily., Disp: 90 tablet, Rfl: 3 .  cetirizine (ZYRTEC) 10 MG tablet, Take 1 tablet (10 mg total) by mouth daily., Disp: 90 tablet, Rfl: 3 .  hydrochlorothiazide (MICROZIDE) 12.5 MG capsule, Take 1 capsule (12.5 mg total) by mouth once., Disp: 90 capsule, Rfl: 3 .  ondansetron (ZOFRAN) 4 MG tablet, Take 1 tablet (4 mg total) by mouth every 8 (  eight) hours as needed for nausea or vomiting., Disp: 20 tablet, Rfl: 0 .  zolpidem (AMBIEN) 10 MG tablet, TAKE ONE TABLET AT BEDTIME AS NEEDED FOR SLEEP, Disp: 30 tablet, Rfl: 5  Current facility-administered medications:  .  albuterol (PROVENTIL) (5 MG/ML) 0.5% nebulizer solution 2.5 mg, 2.5 mg, Nebulization, Once, Rita Ohara, MD:  Allergies  Allergen Reactions  . Sulfonamide Derivatives Shortness Of Breath and Swelling  . Mucinex [Guaifenesin Er] Other (See Comments)    "Jittery and hypes me up"  :  Family History  Problem Relation Age of Onset  . COPD Mother     died age 21yo.  emphysema  . Cancer Father 75    colon cancer; died age 25yo  . Cancer Brother 70    colon cancer  .  Cancer Maternal Aunt     breast  . Diabetes Neg Hx   . Heart disease Neg Hx   . Hypertension Neg Hx   . Stroke Neg Hx   :  History   Social History  . Marital Status: Widowed    Spouse Name: N/A    Number of Children: N/A  . Years of Education: N/A   Occupational History  . compliance agent Investment banker, corporate) [place]    Nurse Practitioner   Social History Main Topics  . Smoking status: Never Smoker   . Smokeless tobacco: Never Used  . Alcohol Use: 1.2 oz/week    2 Glasses of wine per week  . Drug Use: No  . Sexual Activity: Not on file     Comment: exercise - walks, [place], cardio at gym 3 x/wk, yoga, widowed    Other Topics Concern  . 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  Exam: ECOG 0 Blood pressure 136/69, pulse 60, temperature 97.9 F (36.6 C), temperature source Oral, resp. rate 18, height 5\' 6"  (1.676 m), weight 158 lb 11.2 oz (71.986 kg), SpO2 99 %. General appearance: alert and cooperative Head: Normocephalic, without obvious abnormality Throat: lips, mucosa, and tongue normal; teeth and gums normal Neck: no adenopathy Back: negative Chest wall: no tenderness Cardio: regular rate and rhythm, S1, S2 normal, no murmur, click, rub or gallop GI: soft, non-tender; bowel sounds normal; no masses,  no organomegaly Extremities: extremities normal, atraumatic, no cyanosis or edema Pulses: 2+ and symmetric   Recent Labs  05/23/14 1335  WBC 4.2  HGB 12.1  HCT 36.8  PLT 179     Assessment and Plan:   64 year old woman with a mild pancytopenia discovered in December 2015. Her laboratory testing on December 3 showed white cell count of 2.8 and confirmed that  it was down to 2.4 on December 21. Her platelet count also dropped from 143-137. Her CBC today was reviewed with the patient and showed completely normal findings. Her white cell count is 4.2 with a normal differential. Her hemoglobin is 12.1 with normal platelet count of 179. Looking at previous blood counts dating back to 2010 she had some occasional fluctuations similar to this. Her white cell count was down to 2.9 in 2013 and normalized a few months later.  The etiology of these findings is unclear to me and likely reactive in nature.  I do not see any evidence to suggest hematological malignancy or abnormalities. I do not think her conditions such as myelodysplastic syndrome, leukemia, lymphoma or other myeloproliferative disorder presents this way. At this time, I see no reason for any further workup or evaluation. I recommended to continue follow-up with her primary care physician with periodic CBC checks.  I will be happy to see her in the future if her counts drop persistently without spontaneous recovery. All her questions were answered to her satisfaction.

## 2014-05-23 NOTE — Progress Notes (Signed)
Please see consult note.  

## 2014-06-11 ENCOUNTER — Ambulatory Visit (INDEPENDENT_AMBULATORY_CARE_PROVIDER_SITE_OTHER): Payer: PRIVATE HEALTH INSURANCE | Admitting: Internal Medicine

## 2014-06-11 ENCOUNTER — Encounter: Payer: Self-pay | Admitting: Internal Medicine

## 2014-06-11 VITALS — BP 110/80 | HR 64 | Ht 66.25 in | Wt 157.2 lb

## 2014-06-11 DIAGNOSIS — R197 Diarrhea, unspecified: Secondary | ICD-10-CM

## 2014-06-11 DIAGNOSIS — K589 Irritable bowel syndrome without diarrhea: Secondary | ICD-10-CM | POA: Insufficient documentation

## 2014-06-11 DIAGNOSIS — A925 Zika virus disease: Secondary | ICD-10-CM

## 2014-06-11 DIAGNOSIS — K529 Noninfective gastroenteritis and colitis, unspecified: Secondary | ICD-10-CM

## 2014-06-11 HISTORY — DX: Zika virus disease: A92.5

## 2014-06-11 MED ORDER — RIFAXIMIN 550 MG PO TABS: 550.0000 mg | ORAL_TABLET | Freq: Two times a day (BID) | ORAL | Status: DC

## 2014-06-11 NOTE — Assessment & Plan Note (Addendum)
Will see if she can obtain xifaxan 550 mg tid x 14 days - will take prior authorization. Other options could be Lotronex, possibly Viberzi.  FODMAPS diet info provided.  Has been screened for and is negative for celiac disease in past.

## 2014-06-11 NOTE — Progress Notes (Addendum)
Subjective:    Patient ID: Shelby Randolph, female    DOB: Aug 18, 1950, 64 y.o.   MRN: [id number]  HPI  Cc: abdominal pain and diarrhea  64 y/o female presents to the office with intermittent postprandial abdominal pain which is predominantly on the left side of abdomen.  She also has complaints of chronic diarrhea that she has everyday.  Describes it as very watery, explosive, and has it at least once a day.  States it interferes with her quality of life as she becomes very anxious and stressed when she is out and not near a restroom.  Also complains of being nauseated after eating certain types of food.  Two episodes of small amounts of blood in stool in the last 1 month.  Has tried changing her diet to less meats and dairy which hasn't really helped.  Denies vomiting, dysphagia, dyspepsia, unintentional weight loss.    Has a family history of Familial Polyposis and personal history of adenomatous polyps.  Last colonoscopy was in 2014 no polyps Abdominal US done on 03/2014 which showed gallstones but no acute findings.   Allergies  Allergen Reactions  . Sulfonamide Derivatives Shortness Of Breath and Swelling  . Mucinex [Guaifenesin Er] Other (See Comments)    "Jittery and hypes me up"   Outpatient Prescriptions Prior to Visit  Medication Sig Dispense Refill  . acetaminophen (TYLENOL) 500 MG tablet 1-2 tablets twice to three times daily as needed. 100 tablet 5  . acyclovir (ZOVIRAX) 400 MG tablet BID for suppression or TID x 5 days for flare up 90 tablet 2  . albuterol (PROVENTIL HFA;VENTOLIN HFA) 108 (90 BASE) MCG/ACT inhaler Inhale 2 puffs into the lungs as needed. 1 Inhaler 0  . atenolol (TENORMIN) 25 MG tablet Take 1 tablet (25 mg total) by mouth daily. 90 tablet 3  . cetirizine (ZYRTEC) 10 MG tablet Take 1 tablet (10 mg total) by mouth daily. 90 tablet 3  . hydrochlorothiazide (MICROZIDE) 12.5 MG capsule Take 1 capsule (12.5 mg total) by mouth once. 90 capsule 3  . ondansetron  (ZOFRAN) 4 MG tablet Take 1 tablet (4 mg total) by mouth every 8 (eight) hours as needed for nausea or vomiting. 20 tablet 0  . zolpidem (AMBIEN) 10 MG tablet TAKE ONE TABLET AT BEDTIME AS NEEDED FOR SLEEP 30 tablet 5   Facility-Administered Medications Prior to Visit  Medication Dose Route Frequency Provider Last Rate Last Dose  . albuterol (PROVENTIL) (5 MG/ML) 0.5% nebulizer solution 2.5 mg  2.5 mg Nebulization Once Rita Ohara, MD       Past Medical History  Diagnosis Date  . Insomnia   . Anxiety   . Migraines     Dr. Melton Alar, migraines  . Allergy   . Asthma   . Nearsightedness     wears glasses  . Eye injury     in college, some pertinent loss of vision in left eye  . Genital herpes   . Hypertension 12/2011  . History of mammogram     Dr. Radene Knee  . Routine gynecological examination     Dr. Radene Knee  . History of bone density study 09/2011    normal  . History of PFTs 01/20/2011    moderate airflow limitation, no significant response to bronchodilator  . Adenomatous colon polyp   . Diverticulosis   . History of melanoma     sees [facility], dermatology  . Atherosclerosis of aorta 03/2014    abnormal on ultrasound, repeat US within 5  years  . Anemia    Past Surgical History  Procedure Laterality Date  . Rotator cuff repair Right 05/2010  . Tonsillectomy and adenoidectomy    . Colonoscopy  10/2012    Dr. Earlean Shawl  . Varicose vein surgery      bilat  . Vaginal hysterectomy  1998    total; abnormal peroids  . Esophagogastroduodenoscopy     History   Social History  . Marital Status: Widowed    Spouse Name: N/A    Number of Children: 68  . Years of Education: N/A   Occupational History  . 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 Smoking status: Never Smoker   . Smokeless tobacco: Never Used  . Alcohol Use: 1.2 oz/week    2 Glasses of wine per week  . Drug Use: No  . Sexual Activity: None     Comment:  exercise - walks, [place], cardio at gym 3 x/wk, yoga, widowed    Other Topics Concern  . 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 COPD Mother     died age 40yo.  emphysema  . Colon cancer Father 61     died age 62yo  . Colon cancer Brother 34  . Breast cancer Maternal Aunt   . Diabetes Neg Hx   . Heart disease Neg Hx   . Hypertension Neg Hx   . Stroke Neg Hx   . Colon cancer Paternal Uncle     2     Review of Systems  Constitutional: Positive for appetite change. Negative for fever and fatigue.  HENT: Negative for trouble swallowing.   Respiratory: Negative for cough and shortness of breath.   Cardiovascular: Negative for chest pain.  Gastrointestinal: Positive for nausea and diarrhea. Negative for vomiting, abdominal pain, constipation and abdominal distention.  Genitourinary: Negative for dysuria, urgency, frequency and difficulty urinating.       Objective:   Physical Exam  General: Well developed, well nourished, appears in no apparent distress HEENT: Anicteic Sclera.  No pharyngeal erythema or exudates  Neck: Supple, no JVD, no masses  Cardiovascular: RRR, S1 S2 auscultated, no rubs, murmurs or gallops.  Respiratory: Clear to auscultation bilaterally with equal chest rise  Abdomen: No tenderness to palpation, + BS, no masses.; No guarding or peritoneal signs Extremities: warm dry without cyanosis clubbing. Trace LE edema Neuro: AAOx3, cranial nerves grossly intact. Skin: Without rashes exudates or nodules.  Psych: Normal affect and demeanor with intact judgement and insight      Assessment & Plan:   IBS (irritable bowel syndrome)  Chronic  diarrhea  -Will try Xifaxan 550 mg tid x 14 days-will need to get prior authorization. -If Xifaxan not a cost effective option for patient, then other options include Lotronex, possibly Viberzi.  -Informed pt of the FODMAPS diet.  Gave her the list of foods.   -Patient has been screened for  celiac disease in past which was negative.  Colon Screening  -Will schedule next colonoscopy to be in 2019 (5 years from her last).  [facility] [employer] 06/11/2014  I have personally seen the patient, reviewed and  repeated key elements of the history and physical and participated in formation of the assessment and plan the student has documented.  She has tried dicyclomine and loperamide in past without success to help the diarrhea. Hopefully Xifaxan would work.  Gatha Mayer, MD, [facility]

## 2014-06-11 NOTE — Assessment & Plan Note (Signed)
I believe this is IBS. See IBS A/P

## 2014-06-11 NOTE — Patient Instructions (Addendum)
We are putting a colonoscopy recall in our system for June 2019.  You will be notified of this closer to that time.  We are going to do a prior authorization for you to get xifaxan.  This will be done thru [facility] pharmacy.  They will contact you.  We are giving you a FODMAP to read over today for diet information.   I appreciate the opportunity to care for you.

## 2014-06-20 ENCOUNTER — Telehealth: Payer: Self-pay | Admitting: Internal Medicine

## 2014-06-26 ENCOUNTER — Other Ambulatory Visit: Payer: Self-pay | Admitting: Family Medicine

## 2014-06-26 ENCOUNTER — Encounter: Payer: Self-pay | Admitting: Medical

## 2014-06-26 ENCOUNTER — Ambulatory Visit (INDEPENDENT_AMBULATORY_CARE_PROVIDER_SITE_OTHER): Payer: No Typology Code available for payment source | Admitting: Medical

## 2014-06-26 VITALS — BP 110/70 | HR 89 | Temp 98.3°F | Resp 14 | Wt 159.0 lb

## 2014-06-26 DIAGNOSIS — R059 Cough, unspecified: Secondary | ICD-10-CM

## 2014-06-26 DIAGNOSIS — J4521 Mild intermittent asthma with (acute) exacerbation: Secondary | ICD-10-CM | POA: Diagnosis not present

## 2014-06-26 DIAGNOSIS — T148 Other injury of unspecified body region: Secondary | ICD-10-CM

## 2014-06-26 DIAGNOSIS — R7989 Other specified abnormal findings of blood chemistry: Secondary | ICD-10-CM | POA: Diagnosis not present

## 2014-06-26 DIAGNOSIS — W57XXXA Bitten or stung by nonvenomous insect and other nonvenomous arthropods, initial encounter: Secondary | ICD-10-CM | POA: Diagnosis not present

## 2014-06-26 DIAGNOSIS — R05 Cough: Secondary | ICD-10-CM | POA: Diagnosis not present

## 2014-06-26 DIAGNOSIS — Z789 Other specified health status: Secondary | ICD-10-CM | POA: Diagnosis not present

## 2014-06-26 MED ORDER — AZITHROMYCIN 250 MG PO TABS: ORAL_TABLET | ORAL | Status: DC

## 2014-06-26 MED ORDER — PREDNISONE 20 MG PO TABS
ORAL_TABLET | ORAL | Status: DC
Start: 2014-06-26 — End: 2015-01-29

## 2014-06-26 NOTE — Telephone Encounter (Signed)
Is this okay to refill? 

## 2014-06-26 NOTE — Progress Notes (Addendum)
Subjective:  Shelby Randolph is a 64 y.o. female with asthma who presents for illness.  Symptoms include 4 day hx/o bad cough, bad headache, feeling yucky, some wheezing and SOB, ear pain, chest congestion, body aches, chills.  Denies fever, rash, nasuea, vomiting.  Treatment to date: inhaler a few times per day the last few days.   She does not smoke.  No specific sick contacts, but was just on vacation last week in [place], [place], [place], and while on a hike was bitten by a mosquito[place] . No malaria prophylaxis done.  Up until this past weekend wasn't having to use inhaler regularly.   She just recently saw hematology for consult, and hematologist felt like her abnormal blood counts were due to environmental triggers, and values have recently normalized.  Also seeing GI about IBS.  Starts new medication next week.  No other aggravating or relieving factors.  No other c/o.  The following portions of the patient's history were reviewed and updated as appropriate: allergies, current medications, past family history, past medical history, past social history, past surgical history and problem list.  ROS as in subjective  Past Medical History  Diagnosis Date  . Insomnia   . Anxiety   . Migraines     Dr. Melton Alar, migraines  . Allergy   . Asthma   . Nearsightedness     wears glasses  . Eye injury     in college, some pertinent loss of vision in left eye  . Genital herpes   . Hypertension 12/2011  . History of mammogram     Dr. Radene Knee  . Routine gynecological examination     Dr. Radene Knee  . History of bone density study 09/2011    normal  . History of PFTs 01/20/2011    moderate airflow limitation, no significant response to bronchodilator  . Adenomatous colon polyp   . Diverticulosis   . History of melanoma     sees [facility], dermatology  . Atherosclerosis of aorta 03/2014    abnormal on ultrasound, repeat US within 5 years  . Anemia      Objective: BP 110/70 mmHg  Pulse 89  Temp(Src) 98.3 F (36.8 C) (Oral)  Resp 14  Wt 159 lb (72.122 kg)  SpO2 95%   General appearance: Alert, WD/WN, no distress,mildly ill appearing                             Skin: warm, no rash, no diaphoresis                           Head: no sinus tenderness                            Eyes: conjunctiva normal, corneas clear, PERRLA                            Ears: pearly TMs, external ear canals normal                          Nose: septum midline, turbinates swollen, with erythema and clear discharge             Mouth/throat: MMM, tongue normal, mild pharyngeal erythema  Neck: supple, no adenopathy, no thyromegaly, nontender                          Heart: RRR, normal S1, S2, no murmurs                         Lungs: +bronchial breath sounds upper with +scattered rhonchi, otherwise clear, no wheezes, no rales                Extremities: no edema, nontender     Assessment: Encounter Diagnoses  Name Primary?  . Cough Yes  . Asthma with acute exacerbation, mild intermittent   . Recent foreign travel   . Mosquito bite   . Abnormal CBC      Plan:  Medication orders today include: Zpak and short course of prednisone.   Discussed diagnosis and treatment of acute respiratory illness/bronchitis with asthma flare mild.  Suggested symptomatic OTC remedies for cough and congestion.  Tylenol or Ibuprofen OTC for fever and malaise.  No frank signs or symptoms of tropical illness, no fever, no other unusual presentation.  Call/return in 2-3 days if symptoms are worse or not improving.    Of note, recent CBC was normal.

## 2014-06-27 ENCOUNTER — Other Ambulatory Visit: Payer: Self-pay | Admitting: Medical

## 2014-06-27 NOTE — Telephone Encounter (Signed)
Dan from [facility] called to report good news, patient can get xifaxan for $200.00 and she wants to do that so they will mail it too her.

## 2014-07-03 ENCOUNTER — Other Ambulatory Visit: Payer: Self-pay | Admitting: Medical

## 2014-07-03 ENCOUNTER — Telehealth: Payer: Self-pay | Admitting: Medical

## 2014-07-03 DIAGNOSIS — R062 Wheezing: Secondary | ICD-10-CM

## 2014-07-03 DIAGNOSIS — R059 Cough, unspecified: Secondary | ICD-10-CM

## 2014-07-03 DIAGNOSIS — R05 Cough: Secondary | ICD-10-CM

## 2014-07-03 NOTE — Telephone Encounter (Signed)
Pt called and stated that she was still wheezing and coughing. She states that you mentioned a chest xray. Pt can be reached at [phone] and uses [facility]

## 2014-07-03 NOTE — Telephone Encounter (Signed)
If still having those symptoms, then yes go for CXR

## 2014-07-04 ENCOUNTER — Ambulatory Visit
Admission: RE | Admit: 2014-07-04 | Discharge: 2014-07-04 | Disposition: A | Discharge status: Home / Self Care | Payer: No Typology Code available for payment source | Source: Ambulatory Visit | Attending: Medical | Admitting: Medical

## 2014-07-04 ENCOUNTER — Other Ambulatory Visit: Payer: Self-pay | Admitting: Medical

## 2014-07-04 DIAGNOSIS — R059 Cough, unspecified: Secondary | ICD-10-CM

## 2014-07-04 DIAGNOSIS — R062 Wheezing: Secondary | ICD-10-CM

## 2014-07-04 DIAGNOSIS — R05 Cough: Secondary | ICD-10-CM

## 2014-07-04 MED ORDER — METHYLPREDNISOLONE (PAK) 4 MG PO TABS: ORAL_TABLET | ORAL | Status: DC

## 2014-07-04 NOTE — Telephone Encounter (Signed)
Patient is aware to go and an xray of her chest today

## 2014-10-03 ENCOUNTER — Other Ambulatory Visit: Payer: Self-pay | Admitting: Medical

## 2014-10-03 NOTE — Telephone Encounter (Signed)
I called out Ambien to [facility] per Chana Bode PA

## 2014-10-03 NOTE — Telephone Encounter (Signed)
Call out [facility] with 2 refills

## 2014-10-03 NOTE — Telephone Encounter (Signed)
Is this okay to refill? 

## 2015-01-07 ENCOUNTER — Other Ambulatory Visit: Payer: Self-pay | Admitting: Medical

## 2015-01-07 ENCOUNTER — Telehealth: Payer: Self-pay | Admitting: Medical

## 2015-01-07 NOTE — Telephone Encounter (Signed)
Is this okay to renew?

## 2015-01-07 NOTE — Telephone Encounter (Signed)
pls call out Ambien with 1 refill

## 2015-01-08 NOTE — Telephone Encounter (Signed)
Called out med to pharmacy 

## 2015-01-21 ENCOUNTER — Telehealth: Payer: Self-pay | Admitting: Internal Medicine

## 2015-01-21 NOTE — Telephone Encounter (Signed)
Patient has new insurance that will start on 02/09/15.  She is having some continued issues with her IBS.  Xifaxan and dietary changes helped, but wanted to explore additional options.  She will come in on 01/29/15 for a follow up to discuss.  Last office visit notes discusses possibly adding viberzi or lotronex.  She will come in to discuss

## 2015-01-29 ENCOUNTER — Encounter: Payer: Self-pay | Admitting: Internal Medicine

## 2015-01-29 ENCOUNTER — Ambulatory Visit (INDEPENDENT_AMBULATORY_CARE_PROVIDER_SITE_OTHER): Payer: No Typology Code available for payment source | Admitting: Internal Medicine

## 2015-01-29 VITALS — BP 114/86 | HR 64 | Ht 66.25 in | Wt 157.5 lb

## 2015-01-29 DIAGNOSIS — Z86010 Personal history of colonic polyps: Secondary | ICD-10-CM

## 2015-01-29 DIAGNOSIS — K529 Noninfective gastroenteritis and colitis, unspecified: Secondary | ICD-10-CM

## 2015-01-29 NOTE — Progress Notes (Signed)
   Subjective:    Patient ID: Shelby Randolph, female    DOB: 18-May-1950, 64 y.o.   MRN: [id number] Cc: IBS and diarrhea follow-up  HPI Xifaxan helped but still has diarrhea all the time and its "on mind" Hiking - problematic  pc problem  No dairy, meat A lot of veggies - is using FODMAP Medications, allergies, past medical history, past surgical history, family history and social history are reviewed and updated in the EMR.  Review of Systems As above    Objective:   Physical Exam  BP 114/86 mmHg  Pulse 64  Ht 5' 6.25" (1.683 m)  Wt 157 lb 8 oz (71.442 kg)  BMI 25.22 kg/m2 NAD Eyes anicteric A and o x 3    Assessment & Plan:  .History of colonic polyps colonoscopy The risks and benefits as well as alternatives of endoscopic procedure(s) have been discussed and reviewed. All questions answered. The patient agrees to proceed.   Chronic diarrhea Need to r/o micro colitis Colonoscopy ? Sucrase deficiency  25 minutes time spent with patient > half in counseling coordination of care

## 2015-01-29 NOTE — Assessment & Plan Note (Signed)
colonoscopyy The risks and benefits as well as alternatives of endoscopic procedure(s) have been discussed and reviewed. All questions answered. The patient agrees to proceed.

## 2015-01-29 NOTE — Assessment & Plan Note (Signed)
Need to r/o micro colitis Colonoscopy ? Sucrase deficiency

## 2015-01-29 NOTE — Patient Instructions (Signed)
  You have been scheduled for a colonoscopy. Please follow written instructions given to you at your visit today.  Please pick up your over the counter prep supplies at the pharmacy. If you use inhalers (even only as needed), please bring them with you on the day of your procedure.    I appreciate the opportunity to care for you. Silvano Rusk, MD, [facility]

## 2015-02-02 ENCOUNTER — Other Ambulatory Visit: Payer: Self-pay | Admitting: Medical

## 2015-02-04 ENCOUNTER — Other Ambulatory Visit: Payer: Self-pay

## 2015-02-04 NOTE — Telephone Encounter (Signed)
Okay to renew Ambien 

## 2015-02-04 NOTE — Telephone Encounter (Signed)
Is this okay to refill [facility] patient

## 2015-02-27 ENCOUNTER — Other Ambulatory Visit: Payer: Self-pay | Admitting: Medical

## 2015-03-07 ENCOUNTER — Other Ambulatory Visit: Payer: Self-pay | Admitting: Medical

## 2015-03-07 NOTE — Telephone Encounter (Signed)
Is this ok to refill?  

## 2015-03-07 NOTE — Telephone Encounter (Signed)
Call out medication with 1 refill

## 2015-03-08 NOTE — Telephone Encounter (Signed)
Phoned in.

## 2015-03-22 ENCOUNTER — Encounter: Payer: No Typology Code available for payment source | Admitting: Internal Medicine

## 2015-03-26 ENCOUNTER — Ambulatory Visit: Payer: No Typology Code available for payment source | Admitting: Internal Medicine

## 2015-04-16 ENCOUNTER — Encounter: Payer: Self-pay | Admitting: Medical

## 2015-04-16 ENCOUNTER — Ambulatory Visit (INDEPENDENT_AMBULATORY_CARE_PROVIDER_SITE_OTHER): Payer: PPO | Admitting: Medical

## 2015-04-16 VITALS — BP 102/70 | HR 53 | Temp 97.7°F | Wt 158.0 lb

## 2015-04-16 DIAGNOSIS — J4531 Mild persistent asthma with (acute) exacerbation: Secondary | ICD-10-CM

## 2015-04-16 MED ORDER — ALBUTEROL SULFATE HFA 108 (90 BASE) MCG/ACT IN AERS: 2.0000 | INHALATION_SPRAY | RESPIRATORY_TRACT | Status: DC | PRN

## 2015-04-16 MED ORDER — FLUTICASONE FUROATE-VILANTEROL 100-25 MCG/INH IN AEPB: 1.0000 | INHALATION_SPRAY | Freq: Every day | RESPIRATORY_TRACT | Status: DC

## 2015-04-16 MED ORDER — AZITHROMYCIN 250 MG PO TABS: ORAL_TABLET | ORAL | Status: DC

## 2015-04-16 NOTE — Progress Notes (Signed)
Subjective:  Shelby Randolph is a 64 y.o. female who presents for cough and illness.  she notes several weeks of cough, congestion, some head congestion, mostly chest congestion.  Using tylenol with guaifenesin.  Has some sore throat, some SOB.   Did ok at gym last night though.   Exercises regularly. Been fatigue.  No nausea, no vomiting, has IBD diarrhea so that is not new.  Having colonoscopy this Thursday, Dr. Carlean Purl.    Has hoarseness.  No fever.  Has hx/o asthma, using inhaler few times here and there.   No leg swelling, no chest pain.  No other aggravating or relieving factors. No other complaint.  The following portions of the patient's history were reviewed and updated as appropriate: allergies, current medications, past family history, past medical history, past social history, past surgical history and problem list.  ROS as in subjective  Past Medical History  Diagnosis Date  . Insomnia   . Anxiety   . Migraines     Dr. Melton Alar, migraines  . Allergy   . Asthma   . Nearsightedness     wears glasses  . Eye injury     in college, some pertinent loss of vision in left eye  . Genital herpes   . Hypertension 12/2011  . History of mammogram     Dr. Radene Knee  . Routine gynecological examination     Dr. Radene Knee  . History of bone density study 09/2011    normal  . History of PFTs 01/20/2011    moderate airflow limitation, no significant response to bronchodilator  . Adenomatous colon polyp   . Diverticulosis   . History of melanoma     sees [facility], dermatology  . Atherosclerosis of aorta ([place]) 03/2014    abnormal on ultrasound, repeat US within 5 years  . Anemia   . Zika virus disease 06/2014     Objective: BP 102/70 mmHg  Pulse 53  Temp(Src) 97.7 F (36.5 C) (Oral)  Wt 158 lb (71.668 kg)  SpO2 91%  General appearance: Alert, WD/WN, no distress,mildly ill appearing, somewhat hoarse voice                             Skin: warm, no rash, no diaphoresis              Head: no sinus tenderness                            Eyes: conjunctiva normal, corneas clear, PERRLA                            Ears: pearly TMs, external ear canals normal                          Nose: septum midline, turbinates swollen, with erythema and clear discharge             Mouth/throat: MMM, tongue normal, mild pharyngeal erythema                           Neck: supple, no adenopathy, no thyromegaly, nontender                          Heart: RRR, normal S1, S2, no murmurs  Lungs: +bronchial breath sounds, no rhonchi, no wheezes, no rales                Extremities: no edema, nontender     Assessment: Encounter Diagnosis  Name Primary?  Marland Kitchen Asthmatic bronchitis, mild persistent, with acute exacerbation Yes     Plan:  Medication orders today include: zpak, gave sample of Breo to use the next 2 weeks, 1 puff daily.   discussed proper use of medication.  C/t rest, hydration, albuterol inhaler prn, and if not improving within this week, then call or recheck.

## 2015-04-18 ENCOUNTER — Encounter: Payer: Self-pay | Admitting: Internal Medicine

## 2015-04-18 ENCOUNTER — Ambulatory Visit (AMBULATORY_SURGERY_CENTER): Payer: PPO | Admitting: Internal Medicine

## 2015-04-18 VITALS — BP 132/84 | HR 50 | Temp 95.8°F | Resp 16 | Ht 66.25 in | Wt 157.0 lb

## 2015-04-18 DIAGNOSIS — R197 Diarrhea, unspecified: Secondary | ICD-10-CM

## 2015-04-18 DIAGNOSIS — K529 Noninfective gastroenteritis and colitis, unspecified: Secondary | ICD-10-CM

## 2015-04-18 DIAGNOSIS — K6389 Other specified diseases of intestine: Secondary | ICD-10-CM | POA: Diagnosis not present

## 2015-04-18 DIAGNOSIS — Z86010 Personal history of colonic polyps: Secondary | ICD-10-CM | POA: Diagnosis not present

## 2015-04-18 MED ORDER — SODIUM CHLORIDE 0.9 % IV SOLN: 500.0000 mL | INTRAVENOUS | Status: DC

## 2015-04-18 NOTE — Patient Instructions (Addendum)
Diverticulosis, no polyps and no obvious colitis. i took biopsies and will let you know.  I appreciate the opportunity to care for you. Gatha Mayer, MD, FACG    YOU HAD AN ENDOSCOPIC PROCEDURE TODAY AT [facility]:   Refer to the procedure report that was given to you for any specific questions about what was found during the examination.  If the procedure report does not answer your questions, please call your gastroenterologist to clarify.  If you requested that your care partner not be given the details of your procedure findings, then the procedure report has been included in a sealed envelope for you to review at your convenience later.  YOU SHOULD EXPECT: Some feelings of bloating in the abdomen. Passage of more gas than usual.  Walking can help get rid of the air that was put into your GI tract during the procedure and reduce the bloating. If you had a lower endoscopy (such as a colonoscopy or flexible sigmoidoscopy) you may notice spotting of blood in your stool or on the toilet paper. If you underwent a bowel prep for your procedure, you may not have a normal bowel movement for a few days.  Please Note:  You might notice some irritation and congestion in your nose or some drainage.  This is from the oxygen used during your procedure.  There is no need for concern and it should clear up in a day or so.  SYMPTOMS TO REPORT IMMEDIATELY:   Following lower endoscopy (colonoscopy or flexible sigmoidoscopy):  Excessive amounts of blood in the stool  Significant tenderness or worsening of abdominal pains  Swelling of the abdomen that is new, acute  Fever of 100F or higher   For urgent or emergent issues, a gastroenterologist can be reached at any hour by calling [phone].   DIET: Your first meal following the procedure should be a small meal and then it is ok to progress to your normal diet. Heavy or fried foods are harder to digest and may make you feel  nauseous or bloated.  Likewise, meals heavy in dairy and vegetables can increase bloating.  Drink plenty of fluids but you should avoid alcoholic beverages for 24 hours.  ACTIVITY:  You should plan to take it easy for the rest of today and you should NOT DRIVE or use heavy machinery until tomorrow (because of the sedation medicines used during the test).    FOLLOW UP: Our staff will call the number listed on your records the next business day following your procedure to check on you and address any questions or concerns that you may have regarding the information given to you following your procedure. If we do not reach you, we will leave a message.  However, if you are feeling well and you are not experiencing any problems, there is no need to return our call.  We will assume that you have returned to your regular daily activities without incident.  If any biopsies were taken you will be contacted by phone or by letter within the next 1-3 weeks.  Please call us at [phone] if you have not heard about the biopsies in 3 weeks.    SIGNATURES/CONFIDENTIALITY: You and/or your care partner have signed paperwork which will be entered into your electronic medical record.  These signatures attest to the fact that that the information above on your After Visit Summary has been reviewed and is understood.  Full responsibility of the confidentiality of this discharge  information lies with you and/or your care-partner.    Handout was given to your care partner on Diverticulosis. You may resume your current medications today. Please call if any questions or concerns.

## 2015-04-18 NOTE — Op Note (Signed)
[place]  [address] [place], [address]   COLONOSCOPY PROCEDURE REPORT  PATIENT: Shelby, Randolph  MR#: [id number] BIRTHDATE: 31-Jan-1951 , 64  yrs. old GENDER: female ENDOSCOPIST: Gatha Mayer, MD, [facility] PROCEDURE DATE:  04/18/2015 PROCEDURE:   Colonoscopy, diagnostic, Colonoscopy, surveillance , and Colonoscopy with biopsy First Screening Colonoscopy - Avg.  risk and is 50 yrs.  old or older - No.      History of Adenoma - Now for follow-up colonoscopy & has been > or = to 3 yrs.  No.  It has been less than 3 yrs since last colonoscopy.  Medical reason.  History of Adenoma - Now for follow-up colonoscopy & has been > or = to 3 yrs.  No.  It has been less than 3 yrs since last colonoscopy.  Other: See Comments Polyps removed today? No Recommend repeat exam, <10 yrs? Yes high risk ASA CLASS:   Class II INDICATIONS:Surveillance due to prior colonic neoplasia, Clinically significant diarrhea of unexplained origin, PH Colon Adenoma, FH Colon or Rectal Adenocarcinoma, and FH High Risk Genetic Family Cancer Syndrome. MEDICATIONS: Propofol 200 mg IV and Monitored anesthesia care  DESCRIPTION OF PROCEDURE:   After the risks benefits and alternatives of the procedure were thoroughly explained, informed consent was obtained.  The digital rectal exam revealed no abnormalities of the rectum.   The [id number]  endoscope was introduced through the anus and advanced to the terminal ileum which was intubated for a short distance. No adverse events experienced.   The quality of the prep was good.  (MiraLax was used)  The instrument was then slowly withdrawn as the colon was fully examined. Estimated blood loss is zero unless otherwise noted in this procedure report.   COLON FINDINGS: 1) Diverticulosis throughout the colon - left> right. 2) Normal colon and terminal ileum mucosa - random colon biopsies taken.  Retroflexed views revealed no abnormalities.  The time to cecum = 2.2 Withdrawal time = 10.0   The scope was withdrawn and the procedure completed. COMPLICATIONS: There were no immediate complications.  ENDOSCOPIC IMPRESSION: 1) Diverticulosis throughout the colon - left> right. 2) Normal colon and terminal ileum mucosa - random colon biopsies taken  RECOMMENDATIONS: 1.  Await pathology results 2.  Repeat Colonoscopy in 2-3 years.  personal hx polyps and family members w/ hx polyposis and colon cancer No polyps 2014 and today. eSigned:  Gatha Mayer, MD, [facility] 04/18/2015 8:52 AM   cc: The Patient

## 2015-04-18 NOTE — Progress Notes (Signed)
Called to room to assist during endoscopic procedure.  Patient ID and intended procedure confirmed with present staff. Received instructions for my participation in the procedure from the performing physician.  

## 2015-04-18 NOTE — Progress Notes (Signed)
No problems noted in the recovery room. maw 

## 2015-04-18 NOTE — Progress Notes (Signed)
Report to PACU, RN, vss, BBS= Clear.  

## 2015-04-19 ENCOUNTER — Telehealth: Payer: Self-pay | Admitting: *Deleted

## 2015-04-19 NOTE — Telephone Encounter (Signed)
  Follow up Call-  Call back number 04/18/2015  Post procedure Call Back phone  # [phone]  Permission to leave phone message Yes     Patient questions:  Do you have a fever, pain , or abdominal swelling? No. Pain Score  0 *  Have you tolerated food without any problems? Yes.    Have you been able to return to your normal activities? Yes.    Do you have any questions about your discharge instructions: Diet   No. Medications  No. Follow up visit  No.  Do you have questions or concerns about your Care? No.  Actions: * If pain score is 4 or above: No action needed, pain <4.

## 2015-04-25 ENCOUNTER — Other Ambulatory Visit: Payer: Self-pay | Admitting: Internal Medicine

## 2015-04-25 MED ORDER — ONDANSETRON HCL 4 MG PO TABS: 4.0000 mg | ORAL_TABLET | Freq: Three times a day (TID) | ORAL | Status: DC

## 2015-04-25 NOTE — Progress Notes (Signed)
Quick Note:  Call from office - biopsies do not show colitis Melanosis coli suggests laxative use in past at least - I assume she is not using laxatives now as she c/o diarrhea and loose stools - it is possible to have melanosis and not have used laxatives also  I want her to try ondansetron 4 mg qac # 90 no refill to see if that helps her diarrhea - she may find that she does not need it before every meal - I sent Rx  She can My Chart me or call to see if that helps her IBS  [facility] - colon recall 2 yrs and no letter   ______

## 2015-04-26 ENCOUNTER — Encounter: Payer: PPO | Admitting: Medical

## 2015-05-07 ENCOUNTER — Other Ambulatory Visit: Payer: Self-pay | Admitting: Medical

## 2015-05-07 NOTE — Telephone Encounter (Signed)
Is this ok to refill?  

## 2015-05-07 NOTE — Telephone Encounter (Signed)
Phoned in.

## 2015-05-07 NOTE — Telephone Encounter (Signed)
pls call in Ambien + 1 refill

## 2015-05-20 DIAGNOSIS — M8588 Other specified disorders of bone density and structure, other site: Secondary | ICD-10-CM | POA: Diagnosis not present

## 2015-05-20 DIAGNOSIS — N958 Other specified menopausal and perimenopausal disorders: Secondary | ICD-10-CM | POA: Diagnosis not present

## 2015-05-20 LAB — HM DEXA SCAN

## 2015-05-23 ENCOUNTER — Telehealth: Payer: Self-pay | Admitting: Medical

## 2015-05-23 ENCOUNTER — Ambulatory Visit (INDEPENDENT_AMBULATORY_CARE_PROVIDER_SITE_OTHER): Payer: PPO | Admitting: Medical

## 2015-05-23 ENCOUNTER — Encounter: Payer: Self-pay | Admitting: Medical

## 2015-05-23 VITALS — BP 104/60 | HR 60 | Ht 66.25 in | Wt 160.0 lb

## 2015-05-23 DIAGNOSIS — Z0000 Encounter for general adult medical examination without abnormal findings: Secondary | ICD-10-CM | POA: Diagnosis not present

## 2015-05-23 DIAGNOSIS — E559 Vitamin D deficiency, unspecified: Secondary | ICD-10-CM | POA: Diagnosis not present

## 2015-05-23 DIAGNOSIS — Z23 Encounter for immunization: Secondary | ICD-10-CM | POA: Diagnosis not present

## 2015-05-23 DIAGNOSIS — J4520 Mild intermittent asthma, uncomplicated: Secondary | ICD-10-CM | POA: Diagnosis not present

## 2015-05-23 DIAGNOSIS — R1011 Right upper quadrant pain: Secondary | ICD-10-CM | POA: Diagnosis not present

## 2015-05-23 DIAGNOSIS — K529 Noninfective gastroenteritis and colitis, unspecified: Secondary | ICD-10-CM | POA: Diagnosis not present

## 2015-05-23 DIAGNOSIS — I10 Essential (primary) hypertension: Secondary | ICD-10-CM | POA: Diagnosis not present

## 2015-05-23 DIAGNOSIS — G4700 Insomnia, unspecified: Secondary | ICD-10-CM

## 2015-05-23 DIAGNOSIS — I700 Atherosclerosis of aorta: Secondary | ICD-10-CM | POA: Diagnosis not present

## 2015-05-23 DIAGNOSIS — Z114 Encounter for screening for human immunodeficiency virus [HIV]: Secondary | ICD-10-CM | POA: Diagnosis not present

## 2015-05-23 DIAGNOSIS — K589 Irritable bowel syndrome without diarrhea: Secondary | ICD-10-CM

## 2015-05-23 DIAGNOSIS — Z1159 Encounter for screening for other viral diseases: Secondary | ICD-10-CM | POA: Diagnosis not present

## 2015-05-23 DIAGNOSIS — R110 Nausea: Secondary | ICD-10-CM

## 2015-05-23 DIAGNOSIS — A6000 Herpesviral infection of urogenital system, unspecified: Secondary | ICD-10-CM | POA: Diagnosis not present

## 2015-05-23 DIAGNOSIS — Z860100 Personal history of colon polyps, unspecified: Secondary | ICD-10-CM

## 2015-05-23 DIAGNOSIS — J309 Allergic rhinitis, unspecified: Secondary | ICD-10-CM

## 2015-05-23 DIAGNOSIS — F411 Generalized anxiety disorder: Secondary | ICD-10-CM

## 2015-05-23 DIAGNOSIS — E785 Hyperlipidemia, unspecified: Secondary | ICD-10-CM | POA: Diagnosis not present

## 2015-05-23 LAB — COMPREHENSIVE METABOLIC PANEL
ALBUMIN: 4.3 g/dL (ref 3.6–5.1)
ALK PHOS: 47 U/L (ref 33–130)
ALT: 14 U/L (ref 6–29)
AST: 23 U/L (ref 10–35)
BILIRUBIN TOTAL: 0.6 mg/dL (ref 0.2–1.2)
BUN: 17 mg/dL (ref 7–25)
CO2: 30 mmol/L (ref 20–31)
Calcium: 9.4 mg/dL (ref 8.6–10.4)
Chloride: 105 mmol/L (ref 98–110)
Creat: 0.73 mg/dL (ref 0.50–0.99)
GLUCOSE: 81 mg/dL (ref 65–99)
Potassium: 4.2 mmol/L (ref 3.5–5.3)
Sodium: 142 mmol/L (ref 135–146)
TOTAL PROTEIN: 6.6 g/dL (ref 6.1–8.1)

## 2015-05-23 LAB — POCT URINALYSIS DIPSTICK
BILIRUBIN UA: NEGATIVE
Glucose, UA: NEGATIVE
KETONES UA: NEGATIVE
LEUKOCYTES UA: NEGATIVE
Nitrite, UA: NEGATIVE
PH UA: 7
PROTEIN UA: NEGATIVE
RBC UA: NEGATIVE
SPEC GRAV UA: 1.02
Urobilinogen, UA: NEGATIVE

## 2015-05-23 LAB — CBC
HEMATOCRIT: 36.6 % (ref 36.0–46.0)
HEMOGLOBIN: 12.5 g/dL (ref 12.0–15.0)
MCH: 30.7 pg (ref 26.0–34.0)
MCHC: 34.2 g/dL (ref 30.0–36.0)
MCV: 89.9 fL (ref 78.0–100.0)
MPV: 9 fL (ref 8.6–12.4)
Platelets: 159 10*3/uL (ref 150–400)
RBC: 4.07 MIL/uL (ref 3.87–5.11)
RDW: 14.1 % (ref 11.5–15.5)
WBC: 3 10*3/uL — AB (ref 4.0–10.5)

## 2015-05-23 LAB — LIPID PANEL
CHOLESTEROL: 229 mg/dL — AB (ref 125–200)
HDL: 77 mg/dL (ref >=46)
LDL Cholesterol: 132 mg/dL — ABNORMAL HIGH (ref <130)
Total CHOL/HDL Ratio: 3 Ratio (ref <=5.0)
Triglycerides: 100 mg/dL (ref <150)
VLDL: 20 mg/dL (ref <30)

## 2015-05-23 LAB — HEMOGLOBIN A1C
Hgb A1c MFr Bld: 5.4 % (ref <5.7)
MEAN PLASMA GLUCOSE: 108 mg/dL (ref <117)

## 2015-05-23 MED ORDER — HYDROCHLOROTHIAZIDE 12.5 MG PO CAPS: ORAL_CAPSULE | ORAL | Status: DC

## 2015-05-23 NOTE — Patient Instructions (Addendum)
MEDICARE PREVENTATIVE SERVICES (FEMALE) AND PERSONALIZED PLAN for Shelby Randolph May 23, 2015  Encounter Diagnoses  Name Primary?  . Medicare annual wellness visit, initial Yes  . Encounter for health maintenance examination in adult   . Need for prophylactic vaccination against Streptococcus pneumoniae (pneumococcus)   . Essential hypertension   . Dyslipidemia   . Asthma, mild intermittent, uncomplicated   . Insomnia   . Chronic nausea   . Genital herpes   . History of colonic polyps   . Right upper quadrant pain   . Chronic diarrhea   . Allergic rhinitis, unspecified allergic rhinitis type   . Atherosclerosis of abdominal aorta ([place])   . IBS (irritable bowel syndrome)   . Generalized anxiety disorder   . Vitamin D deficiency   . Routine history and physical examination of adult   . Need for hepatitis C screening test   . Screening for HIV (human immunodeficiency virus)     CONDITIONS OR RISKS IDENTIFIED TODAY: Hypertension - continue Hydrochlorothiazide, but STOP Atenolol for now to see if this helps fatigue  Fatigue - exercise regularly, stop Atenolol for now  Insomnia - continue Ambien as needed, consider sleep study  Dyslipidemia - follow up pending labs  Asthma - during spring and fall if you typically get flare ups, then consider going back on Breo during those months.  Use albuterol inhaler as needed.  Anxiety - consider the big picture, what makes you happy, setting goals.  Consider counseling  Chronic nausea - we will call back about recommendations  Other recommendations:  See your eye doctor yearly for routine vision care.  See your dentist yearly for routine dental care including hygiene visits twice yearly.  See your gynecologist yearly for routine gynecological care.  You are up to date on cancer screens.  Get your mammogram yearly, you are due for repeat colonoscopy in 2-3 years.  You are up to date on vaccines.   Get you flu shot yearly  We  updated your Pneumococcal 13 vaccine today.   Return pending labs, yearly for annual wellness visit   [place]:  Supplements:  . Take a daily baby Aspirin 81mg  at bedtime for heart health unless you have a history of gastrointestinal bleed, allergy to aspirin, or are already taking higher dose Aspirin or other antiplatelet or blood thinner medication.   . Consume 1200 mg of Calcium daily through dietary calcium or supplement if you are female age 66 or older, or men 78 and older.   Men aged 38-70 should consume 1000 mg of Calcium daily. . Take 600 IU of Vitamin D daily.  Take 800 IU of Calcium daily if you are older than age 55.  . Take a general multivitamin daily.   Healthy diet: Eat a variety of foods, including fruits, vegetables, vegetable protein such as beans, lentils, tofu, and grains, such as rice.  Limit meat or animal protein, but if you eat meat, choose leans cuts such as chicken, fish, or [place].  Drink plenty of water daily.  Decrease saturated fat in the diet, avoid lots of red meat, processed foods, sweets, fast foods, and fried foods.  Limit salt and caffeine intake.  Exercise: Aerobic exercise helps maintain good heart health. Weight bearing exercise helps keep bones and muscles working strong.  We recommend at least 30-40 minutes of exercise most days of the week.   Fall prevention: Falls are the leading cause of injuries, accidents, and accidental deaths in people over the  age of 79. Falling is a real threat to your ability to live on your own.  Causes include poor eyesight or poor hearing, illness, poor lighting, throw rugs, clutter in your home, and medication side effects causing dizziness or balance problems.  Such medications can include medications for depression, sleep problems, high blood pressure, diabetes, and heart conditions.   PREVENTION  Be sure your home is as safe as possible. Here are some tips:  Wear shoes with non-skid  soles (not house slippers).   Be sure your home and outside area are well lit.   Use night lights throughout your house, including hallways and stairways.   Remove clutter and clean up spills on floors and walkways.   Remove throw rugs or fasten them to the floor with carpet tape. Tack down carpet edges.   Do not place electrical cords across pathways.   Install grab bars in your bathtub, shower, and toilet area. Towel bars should not be used as a grab bar.   Install handrails on both sides of stairways.   Do not climb on stools or stepladders. Get someone else to help with jobs that require climbing.   Do not wax your floors at all, or use a non-skid wax.   Repair uneven or unsafe sidewalks, walkways or stairs.   Keep frequently used items within reach.   Be aware of pets so you do not trip.  Get regular check-ups from your doctor, and take good care of yourself:  Have your eyes checked every year for vision changes, cataracts, glaucoma, and other eye problems. Wear eyeglasses as directed.   Have your hearing checked every 2 years, or anytime you or others think that you cannot hear well. Use hearing aids as directed.   See your caregiver if you have foot pain or corns. Sore feet can contribute to falls.   Let your caregiver know if a medicine is making you feel dizzy or making you lose your balance.   Use a cane, walker, or wheelchair as directed. Use walker or wheelchair brakes when getting in and out.   When you get up from bed, sit on the side of the bed for 1 to 2 minutes before you stand up. This will give your blood pressure time to adjust, and you will feel less dizzy.   If you need to go to the bathroom often, consider using a bedside commode.  Disease prevention:  If you smoke or chew tobacco, find out from your caregiver how to quit. It can literally save your life, no matter how long you have been a tobacco user. If you do not use tobacco, never begin. Medicare  does cover some smoking cessation counseling.  Maintain a healthy diet and normal weight. Increased weight leads to problems with blood pressure and diabetes. We check your height, weight, and BMI as part of your yearly visit.  The Body Mass Index or BMI is a way of measuring how much of your body is fat. Having a BMI above 27 increases the risk of heart disease, diabetes, hypertension, stroke and other problems related to obesity. Your caregiver can help determine your BMI and based on it develop an exercise and dietary program to help you achieve or maintain this important measurement at a healthful level.  High blood pressure causes heart and blood vessel problems.  Persistent high blood pressure should be treated with medicine if weight loss and exercise do not work.  We check your blood pressure as part of  your yearly visit.  Avoid drinking alcohol in excess (more than two drinks per day).  Avoid use of street drugs. Do not share needles with anyone. Ask for professional help if you need assistance or instructions on stopping the use of alcohol, cigarettes, and/or drugs.  Brush your teeth twice a day with fluoride toothpaste, and floss once a day. Good oral hygiene prevents tooth decay and gum disease. The problems can be painful, unattractive, and can cause other health problems. Visit your dentist for a routine oral and dental checkup and preventive care every 6-12 months.   See your eye doctor yearly for routine screening for things like glaucoma.  Look at your skin regularly.  Use a mirror to look at your back. Notify your caregivers of changes in moles, especially if there are changes in shapes, colors, a size larger than a pencil eraser, an irregular border, or development of new moles.  Safety:  Use seatbelts 100% of the time, whether driving or as a passenger.  Use safety devices such as hearing protection if you work in environments with loud noise or significant background noise.   Use safety glasses when doing any work that could send debris in to the eyes.  Use a helmet if you ride a bike or motorcycle.  Use appropriate safety gear for contact sports.  Talk to your caregiver about gun safety.  Use sunscreen with a SPF (or skin protection factor) of 15 or greater.  Lighter skinned people are at a greater risk of skin cancer. Don't forget to also wear sunglasses in order to protect your eyes from too much damaging sunlight. Damaging sunlight can accelerate cataract formation.   If you have multiple sexual partners, or if you are not in a monogamous relationship, practice safe sex. Use condoms. Condoms are used to help reduce the spread of sexually transmitted infections (or STIs).  Consider an HIV test if you have never been tested.  Consider routine screening for STIs if you have multiple sexual partners.   Keep carbon monoxide and smoke detectors in your home functioning at all times. Change the batteries every 6 months or use a model that plugs into the wall or is hard wired in.   END OF LIFE PLANNING/ADVANCED DIRECTIVES Advance health-care planning is deciding the kind of care you want at the end of life. While alert competent adults are able to exercise their rights to make health care and financial decisions, problems arise when an individual becomes unconscious, incapacitated, or otherwise unable to communicate or make such decisions. Advance health care directives are the legal documents in which you give written instructions about your choices limited, aggressive or palliative care if, in the future, you cannot speak for yourself.  Advanced directives include the following: [place] allows you to appoint someone to act as your health care agent to make health care decisions for you should it be determined by your health care provider that you are no longer able to make these decisions for yourself.  A Living Will is a legal document in which you can  declare that under certain conditions you desire your life not be prolonged by extraordinary or artificial means during your last illness or when you are near death. We can provide you with sample advanced directives, you can get an attorney to prepare these for you, or you can visit [place] Secretary of State's website for additional information and resources at [email or web address]  Further, I recommend you have an  attorney prepare a Will and Durable Power of Attorney if you haven't done so already.  Please get [place] a copy of your health care Advanced Directives.   PREVENTATIV E CARE RECOMMENDATIONS:  Vaccinations: We recommend the following vaccinations as part of your preventative care:  Pneumococcal vaccine is recommended to protect against certain types of pneumonia.  This is normally recommended for adults age 21 or older once, or up to every 5 years for those at high risk.  The vaccine is also recommended for adults younger than 66 years old with certain underlying conditions that make them high risk for pneumonia.  Influenza vaccine is recommended to protect against seasonal influenza or "the flu." Influenza is a serious disease that can lead to hospitalization and sometimes even death. Traditional flu vaccines (called trivalent vaccines) are made to protect against three flu viruses; an influenza A (H1N1) virus, an influenza A (H3N2) virus, and an influenza B virus. In addition, there are flu vaccines made to protect against four flu viruses (called "quadrivalent" vaccines). These vaccines protect against the same viruses as the trivalent vaccine and an additional B virus.  We recommend the high dose influenza vaccine to those 65 years and older.  Hepatitis B vaccine to protect against a form of infection of the liver by a virus acquired from blood or body fluids, particularly for high risk groups.  Td or Tdap vaccine to protect against Tetanus, diphtheria and pertussis which  can be very serious.  These diseases are caused by bacteria.  Diphtheria and pertussis are spread from person to person through coughing or sneezing.  Tetanus enters the body through cuts, scratches, or wounds.  Tetanus (Lockjaw) causes painful muscle tightening and stiffness, usually all over the body.  Diphtheria can cause a thick coating to form in the back of the throat.  It can lead to breathing problems, paralysis, heart failure, and death.  Pertussis (Whooping Cough) causes severe coughing spells, which can cause difficulty breathing, vomiting and disturbed sleep.  Td or Tdap is usually given every 10 years.  Shingles vaccine to protect against Varicella Zoster if you are older than age 72, or younger than 66 years old with certain underlying illness.    Cancer Screening: Most routine colon cancer screening begins at the age of 71.  Subsequent colonoscopies are performed either every 5-10 years for normal screening, or every 2-5 years for higher risks patients, up until age 99 years of age. Annual screening is done with easy to use take-home tests to check for hidden blood in the stool called hemoccult tests.  Sigmoidoscopy or colonoscopy can detect the earliest forms of colon cancer and is life saving. These tests use a small camera at the end of a tube to directly examine the colon.   Pelvic Exam and Pap Smear: Pelvic exams and pap smears are performed routinely to evaluate for abnormalities as well as cancers including cervical and vaginal cancers.  This is generally performed every 2-3 years for most women, or more frequently for higher risk patients.  Mammograms: Mammograms are used to screen for breast cancer.  Medicare covers baseline screening once from ages 63-65 years old, but will cover mammograms yearly for those 40 years and older.  In accordance with other guidelines, you may not need a mammogram every year though.  The decision on how frequently you need a mammogram should be  discussed with you medical provider.    Osteoporosis Screening: Screening for osteoporosis usually begins at age 48 for women,  and can be done as frequent as every 2 years.  However, women or men with higher risk of osteoporosis may be screened earlier than age 60.  Osteoporosis or low bone mass is diminished bone strength from alterations in bone architecture leading to bone fragility and increased fracture risk.     Cardiovascular Screening: Fat and cholesterol leaves deposits in your arteries that can block them. This causes heart disease and vessel disease elsewhere in your body.  If your cholesterol is found to be high, or if you have heart disease or certain other medical conditions, then you may need to have your cholesterol monitored frequently and be treated with medication. Cardiovascular screening in the form of lab tests for cholesterol, HDL and triglycerides can be done every 5 years.  A screening electrocardiogram can be done as part of the Welcome to Medicare physical.  Diabetes Screening: Diabetes screening can be done at least every 3 years for those with risk factors,  or every 6-7months for prediabetic patients.  Screening includes fasting blood sugar test or glucose tolerance test.  Risk factors include hypertension, dyslipidemia, obesity, previously abnormal glucose tests, family history of diabetes, age 45 years or older, and history of gestations diabetes.   AAA (abdominal aortic aneurysm) Screening: Medicare allows for a one time ultrasound to screen for abdominal aortic aneurysm if done as a referral as part of the Welcome to Medicare exam.  Men eligible for this screening include those men between age 14-54 years of age who have smoked at least 100 cigarettes in his lifetime and/or has a family history of AAA.  HIV Screening:  Medicare allows for yearly screening for patients at high risk for contracting HIV disease.

## 2015-05-23 NOTE — Telephone Encounter (Signed)
Looking back over her chart, ask her to Stop atenolol for the time being.   Lets see if this improved fatigue, heart rate, but have her monitor BP at least 3 times per week to make sure its not running high.   Secondly, has she used Donnatal, Reglan for the chronic nausea?  When she eats does it seem to take a long time for food to digest or to have to go poop?

## 2015-05-23 NOTE — Progress Notes (Signed)
Subjective:    Shelby Randolph is a 65 y.o. female who presents for Medicare IPPE, "Welcome to Medicare" visit and med check.   Names of Other Physician/Practitioners you currently use: Deeya Richeson, Redge Gainer, PA-C here for primary care Dr. Melton Alar, Headache clinic Lavonna Monarch, NP with dermatology Dr. Silvano Rusk, GI Dr. Radene Knee, Gyn Dr. Sabra Heck eye, but also saw Dr. Serita Grit office at [facility] Dr. Zola Button, oncology/hematology  Medical Services you may have received from other than [facility] providers in the past year (date may be approximate) Dr. Carlean Purl GI f/u for chronic nausea and hematology with Dr. Alen Blew for pancytopenia episode.  History reviewed: allergies, current medications, past family history, past medical history, past social history, past surgical history and problem list  Current Problems (verified) Patient Active Problem List   Diagnosis Date Noted  . Encounter for health maintenance examination in adult 05/23/2015  . Generalized anxiety disorder 05/23/2015  . Vitamin D deficiency 05/23/2015  . IBS (irritable bowel syndrome) 06/11/2014  . Gallstones 04/12/2014  . Dyslipidemia 04/12/2014  . Essential hypertension 04/12/2014  . Asthma, mild intermittent 04/12/2014  . Insomnia 04/12/2014  . Genital herpes 04/12/2014  . Chronic nausea 04/12/2014  . Right upper quadrant pain 04/12/2014  . History of colonic polyps 04/12/2014  . Chronic diarrhea 04/12/2014  . Rhinitis, allergic 04/12/2014  . Atherosclerosis of abdominal aorta ([place]) 04/12/2014    Immunizations: Immunization History  Administered Date(s) Administered  . Influenza Split 02/16/2011  . Influenza Whole 02/21/2013  . Influenza-Unspecified 02/20/2014, 02/20/2015  . Pneumococcal Conjugate-13 05/23/2015  . Pneumococcal Polysaccharide-23 02/03/2012  . Td 07/18/2007  . Tdap 01/16/2011  . Zoster 03/18/2011    Risk Factors: Tobacco female does not smoke.  Patient is not a former smoker. Are there  smokers in your home (other than you)?  No  Alcohol Current alcohol use: limited  Caffeine Current caffeine use: limited  Exercise Current exercise habits: Home exercise routine includes walking daily, walks her dogs at [facility].  Current exercise: walking  Nutrition/Diet Current diet: in general, a "healthy" diet  , but given chronic nausea avoids meat, avoids lots of carbs, eats mostly fruits and vegetables  Cardiac risk factors: advanced age (older than 52 for men, 13 for women), dyslipidemia and hypertension.  Depression Screen Nurse depression screen reviewed.  Depression screen PHQ 2/9 05/23/2015  Decreased Interest 0  Down, Depressed, Hopeless 0  PHQ - 2 Score 0    Activities of Daily Living Nurse ADLs screen reviewed.  Functional Status Survey: Is the patient deaf or have difficulty hearing?: No Does the patient have difficulty seeing, even when wearing glasses/contacts?: Yes (only has sight in rt eye) Does the patient have difficulty concentrating, remembering, or making decisions?: Yes (concentrating) Does the patient have difficulty walking or climbing stairs?: No Does the patient have difficulty dressing or bathing?: No Does the patient have difficulty doing errands alone such as visiting a doctor's office or shopping?: No  Fall screen Fall Risk  05/23/2015  Falls in the past year? No    Hearing Difficulties: No Do you often ask people to speak up or repeat themselves? No Do you experience ringing or noises in your ears? No Do you have difficulty understanding soft or whispered voices? No  Cognition  Do you feel that you have a problem with memory? Yes, mild  Do you often misplace items? No  Do you feel safe at home?  Yes  Advanced directives Does patient have a [place]? Yes  Does patient have a Living Will? Yes  Screening Tests Health Maintenance  Topic Date Due  . Hepatitis C Screening  [birth year]  . HIV Screening   02/24/1966  . PNA vac Low Risk Adult (1 of 2 - PCV13) 02/25/2015  . INFLUENZA VACCINE  12/10/2015  . MAMMOGRAM  03/29/2016  . COLONOSCOPY  04/17/2017  . PAP SMEAR  05/22/2018  . TETANUS/TDAP  01/15/2021  . DEXA SCAN  Completed  . ZOSTAVAX  Completed   Past Medical History  Diagnosis Date  . Insomnia   . Anxiety   . Migraines     Dr. Melton Alar, migraines  . Allergy   . Asthma   . Nearsightedness     wears glasses  . Eye injury     in college, some pertinent loss of vision in left eye  . Genital herpes   . Hypertension 12/2011  . History of mammogram     Dr. Radene Knee  . Routine gynecological examination     Dr. Radene Knee  . History of bone density study 09/2011    normal  . History of PFTs 01/20/2011    moderate airflow limitation, no significant response to bronchodilator  . Adenomatous colon polyp   . Diverticulosis   . History of melanoma     sees [facility], dermatology  . Atherosclerosis of aorta ([place]) 03/2014    abnormal on ultrasound, repeat US within 5 years  . Anemia   . Zika virus disease 06/2014  . Depression   . GERD (gastroesophageal reflux disease)     Past Surgical History  Procedure Laterality Date  . Rotator cuff repair Right 05/2010  . Tonsillectomy and adenoidectomy    . Colonoscopy  2016    Dr. Silvano Rusk, 2014 Dr. Earlean Shawl  . Varicose vein surgery      bilat  . Vaginal hysterectomy  1998    total; abnormal peroids  . Esophagogastroduodenoscopy      Social History   Social History  . Marital Status: Widowed    Spouse Name: N/A  . Number of Children: 5  . Years of Education: N/A   Occupational History  . compliance agent Investment banker, corporate) [place]    Nurse Practitioner   Social History Main Topics  . Smoking status: Never Smoker   . Smokeless tobacco: Never Used  . Alcohol Use: 1.2 oz/week    2 Glasses of wine per week  . Drug Use: No  . Sexual Activity: Not on file     Comment: exercise - walks[place], cardio at gym  3 x/wk, yoga, widowed    Other Topics Concern  . Not on file   Social History Narrative   Lives alone, has 5 children ([place], 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 COPD Mother     died age 73yo.  emphysema  . Colon cancer Father 41     died age 58yo  . Colon cancer Brother 66  . Breast cancer Maternal Aunt   . Diabetes Neg Hx   . Heart disease Neg Hx   . Hypertension Neg Hx   . Stroke Neg Hx   . Colon cancer Paternal Uncle     2     Current outpatient prescriptions:  .  atenolol (TENORMIN) 25  MG tablet, TAKE ONE TABLET EACH DAY, Disp: 90 tablet, Rfl: 0 .  cetirizine (ZYRTEC) 10 MG tablet, Take 1 tablet (10 mg total) by mouth daily., Disp: 90 tablet, Rfl: 3 .  hydrochlorothiazide (MICROZIDE) 12.5 MG capsule, TAKE ONE CAPSULE EACH DAY, Disp: 90 capsule, Rfl: 0 .  ondansetron (ZOFRAN) 4 MG tablet, Take 1 tablet (4 mg total) by mouth 3 (three) times daily before meals., Disp: 90 tablet, Rfl: 0 .  Probiotic Product (PROBIOTIC DAILY PO), Take 1 capsule by mouth daily., Disp: , Rfl:  .  zolpidem (AMBIEN) 10 MG tablet, Take 1 tablet (10 mg total) by mouth at bedtime as needed for sleep., Disp: 30 tablet, Rfl: 2 .  acetaminophen (TYLENOL) 500 MG tablet, 1-2 tablets twice to three times daily as needed. (Patient not taking: Reported on 05/23/2015), Disp: 100 tablet, Rfl: 5 .  acyclovir (ZOVIRAX) 400 MG tablet, BID for suppression or TID x 5 days for flare up (Patient not taking: Reported on 04/16/2015), Disp: 90 tablet, Rfl: 2 .  albuterol (PROVENTIL HFA;VENTOLIN HFA) 108 (90 BASE) MCG/ACT inhaler, Inhale 2 puffs into the lungs as needed. (Patient not taking: Reported on 05/23/2015), Disp: 1 Inhaler, Rfl: 0 .  azithromycin (ZITHROMAX) 250 MG tablet, 2 tablets day 1, then  1 tablet days 2-4 (Patient not taking: Reported on 05/23/2015), Disp: 6 tablet, Rfl: 0 .  Fluticasone Furoate-Vilanterol (BREO ELLIPTA) 100-25 MCG/INH AEPB, Inhale 1 puff into the lungs daily. (Patient not taking: Reported on 05/23/2015), Disp: 14 each, Rfl: 0  Current facility-administered medications:  .  albuterol (PROVENTIL) (5 MG/ML) 0.5% nebulizer solution 2.5 mg, 2.5 mg, Nebulization, Once, Rita Ohara, MD  Allergies  Allergen Reactions  . Sulfonamide Derivatives Shortness Of Breath and Swelling  . Mucinex [Guaifenesin Er] Other (See Comments)    "Jittery and hypes me up"     Other concerns: Concerns - anxiety.   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  She is still paying on kids school loans.  She lives alone but sometimes feels alone althogh she sees her friends.  She does enjoy her privacy and walking her dogs.   Goes to [employer].  Hasn't talked to a Social worker.     Has chronic nausea.   Saw GI this past year for this, tried a few difffernet medications including XIfaxin but still can't seem to find resolution.    compliatn with medications without side effect for sleep, BP, not taking asthma medications currently due to asymptomatic.   Objective:   BP 104/60 mmHg  Pulse 60  Ht 5' 6.25" (1.683 m)  Wt 160 lb (72.576 kg)  BMI 25.62 kg/m2  General appearance: alert, no distress, WD/WN, white  female Cognitive Testing  Alert? Yes  Normal Appearance?Yes  Oriented to person? Yes  Place? Yes   Time? Yes  Recall of three objects?  Yes  Can perform simple calculations? Yes  Displays appropriate judgment?Yes  Can read the correct time from a watch face?Yes  HEENT: normocephalic, sclerae anicteric, TMs pearly, nares patent, no  discharge or erythema, pharynx normal Oral cavity: MMM, no lesions Neck: supple, no lymphadenopathy, no thyromegaly, no masses Heart: RRR, normal S1, S2, no murmurs Lungs: CTA bilaterally, no wheezes, rhonchi, or rales Abdomen: +bs, soft, non tender, non distended,  no masses, no hepatomegaly, no splenomegaly Musculoskeletal: non tender, no swelling, no obvious deformity Extremities: no edema, no cyanosis, no clubbing Pulses: 2+ symmetric, upper and lower extremities, normal cap refill Neurological: alert, oriented x 3, CN2-12 intact, strength normal upper extremities and lower extremities, sensation normal throughout, DTRs 2+ throughout, no cerebellar signs, gait normal Psychiatric: normal affect, behavior normal, pleasant  Breast/gyn/rectal - deferred to gyn   Adult ECG Report  Indication: medicare wellness  Rate: 48 bpm  Rhythm: sinus bradycardia  QRS Axis: -41 degrees  PR Interval: 262ms  QRS Duration: 88ms  QTc: 435ms  Conduction Disturbances: first-degree A-V block   Other Abnormalities: none and T wave inversion III  Patient's cardiac risk factors are: advanced age (older than 56 for men, 3 for women), dyslipidemia and hypertension.  EKG comparison: 11/2009, no change   Narrative Interpretation: 1st degree AV block, otherwise no acute changes    Assessment:   Encounter Diagnoses  Name Primary?  . Medicare annual wellness visit, initial Yes  . Encounter for health maintenance examination in adult   . Need for prophylactic vaccination against Streptococcus pneumoniae (pneumococcus)   . Essential hypertension   . Dyslipidemia   . Asthma, mild intermittent, uncomplicated   . Insomnia   . Chronic nausea   . Genital herpes   . History of colonic polyps   . Right upper quadrant pain   . Chronic diarrhea   . Allergic rhinitis, unspecified allergic rhinitis type   . Atherosclerosis of abdominal aorta ([place])   . IBS (irritable bowel syndrome)   . Generalized anxiety  disorder   . Vitamin D deficiency   . Routine history and physical examination of adult   . Need for hepatitis C screening test   . Screening for HIV (human immunodeficiency virus)      Plan:    Physical exam - discussed healthy lifestyle, diet, exercise, preventative care, vaccinations, and addressed their concerns.  Handout given.  During the course of the visit the patient was educated and counseled about appropriate screening and preventive services including:    Pneumococcal vaccine   Screening electrocardiogram  Screening mammography  Screening Pap smear and pelvic exam   Bone densitometry screening  Colorectal cancer screening  Diabetes screening  Glaucoma screening  Nutrition counseling   Advanced directives: has an advanced directive - a copy HAS NOT been provided.  Specific recommendations: Hypertension - continue Hydrochlorothiazide, but STOP Atenolol for now to see if this helps fatigue  Fatigue - exercise regularly, stop Atenolol for now  Insomnia - continue Ambien as needed, consider sleep study  Dyslipidemia - follow up pending labs  Asthma - during spring and fall if you typically get flare ups, then consider going back on Breo during those months.  Use albuterol inhaler as needed.  Anxiety - consider the big picture, what makes you happy, setting goals.  Consider counseling  Chronic nausea - we will call back about recommendations  Other recommendations:  See your eye doctor yearly for routine vision care.  See your dentist yearly for routine dental care including hygiene visits twice yearly.  See your gynecologist yearly for routine gynecological care.  You are up to date on cancer screens.  Get your mammogram yearly, you are due for repeat colonoscopy in 2-3 years.  You are up to date on vaccines.   Get you flu shot yearly  We updated your Pneumococcal 13 vaccine today.  Counseled on the pneumococcal vaccine.  Vaccine information  sheet given.  Pneumococcal  vaccine given after consent obtained.  Medicare Attestation I have personally reviewed: The patient's medical and social history Their use of alcohol, tobacco or illicit drugs Their current medications and supplements The patient's functional ability including ADLs,fall risks, home safety risks, cognitive, and hearing and visual impairment Diet and physical activities Evidence for depression or mood disorders  The patient's weight, height, BMI, and visual acuity have been recorded in the chart.  I have made referrals, counseling, and provided education to the patient based on review of the above and I have provided the patient with a written personalized care plan for preventive services.     Crisoforo Oxford, PA-C   05/23/2015

## 2015-05-23 NOTE — Telephone Encounter (Signed)
States that she will probably be coming by here to check blood pressure. Pt has tried reglan, wants to know if donnatal is different than what she is on now? Wants to know will she take this with what she is already on? Wants to say if she needs to stop the hydrochloratizide?

## 2015-05-23 NOTE — Telephone Encounter (Signed)
LMTCB

## 2015-05-24 ENCOUNTER — Other Ambulatory Visit: Payer: Self-pay | Admitting: Medical

## 2015-05-24 LAB — HIV ANTIBODY (ROUTINE TESTING W REFLEX): HIV: NONREACTIVE

## 2015-05-24 LAB — MICROALBUMIN / CREATININE URINE RATIO
CREATININE, URINE: 106 mg/dL (ref 20–320)
MICROALB UR: 0.4 mg/dL
MICROALB/CREAT RATIO: 4 ug/mg{creat} (ref <30)

## 2015-05-24 LAB — HEPATITIS C ANTIBODY: HCV Ab: NEGATIVE

## 2015-05-24 LAB — VITAMIN D 25 HYDROXY (VIT D DEFICIENCY, FRACTURES): VIT D 25 HYDROXY: 29 ng/mL — AB (ref 30–100)

## 2015-05-24 LAB — TSH: TSH: 1.53 u[IU]/mL (ref 0.350–4.500)

## 2015-05-24 MED ORDER — METOCLOPRAMIDE HCL 5 MG PO TABS: 5.0000 mg | ORAL_TABLET | Freq: Two times a day (BID) | ORAL | Status: DC

## 2015-05-24 NOTE — Telephone Encounter (Signed)
Pt stated the only meds that the gi has her on now is zofran. Stopped the xifaxin because it did not help. Would she take these with the zofran or stop the zofran? She just started in 04/2015 with zofran

## 2015-05-24 NOTE — Telephone Encounter (Signed)
Continue hydrochlorothiazide, but lets stop Atenolol for now.    I'm not sure which IBS/nausea/diarrhea GI has her on currently.  Its not listed, so have her verify what she is taking?  She mentioned Xifaxin but I think she has already stopped this.  Then I can give recommendation on Reglan or Donnatal or other.

## 2015-05-24 NOTE — Telephone Encounter (Signed)
Pt aware.

## 2015-05-24 NOTE — Telephone Encounter (Signed)
Given the chronic nausea, lets try a month of Reglan 5mg  twice daily morning and evening before breakfast and supper.   Lets see if this gives her improvement.  C/t Zofran prn.

## 2015-05-24 NOTE — Telephone Encounter (Signed)
LMTCB

## 2015-05-28 ENCOUNTER — Other Ambulatory Visit: Payer: Self-pay | Admitting: Medical

## 2015-05-28 MED ORDER — VITAMIN D (ERGOCALCIFEROL) 1.25 MG (50000 UNIT) PO CAPS: 50000.0000 [IU] | ORAL_CAPSULE | ORAL | Status: DC

## 2015-06-29 ENCOUNTER — Other Ambulatory Visit: Payer: Self-pay | Admitting: Medical

## 2015-07-01 DIAGNOSIS — Z0000 Encounter for general adult medical examination without abnormal findings: Secondary | ICD-10-CM | POA: Diagnosis not present

## 2015-07-01 DIAGNOSIS — Z6825 Body mass index (BMI) 25.0-25.9, adult: Secondary | ICD-10-CM | POA: Diagnosis not present

## 2015-07-01 DIAGNOSIS — G4700 Insomnia, unspecified: Secondary | ICD-10-CM | POA: Diagnosis not present

## 2015-07-01 DIAGNOSIS — E782 Mixed hyperlipidemia: Secondary | ICD-10-CM | POA: Diagnosis not present

## 2015-07-01 DIAGNOSIS — K589 Irritable bowel syndrome without diarrhea: Secondary | ICD-10-CM | POA: Diagnosis not present

## 2015-07-01 DIAGNOSIS — I10 Essential (primary) hypertension: Secondary | ICD-10-CM | POA: Diagnosis not present

## 2015-07-01 DIAGNOSIS — E663 Overweight: Secondary | ICD-10-CM | POA: Diagnosis not present

## 2015-07-01 DIAGNOSIS — Z7982 Long term (current) use of aspirin: Secondary | ICD-10-CM | POA: Diagnosis not present

## 2015-07-01 NOTE — Telephone Encounter (Signed)
Call this out

## 2015-07-01 NOTE — Telephone Encounter (Signed)
Is this ok to refill?  

## 2015-07-01 NOTE — Telephone Encounter (Signed)
Phoned in to [facility]

## 2015-07-26 ENCOUNTER — Telehealth: Payer: Self-pay | Admitting: Medical

## 2015-07-26 ENCOUNTER — Other Ambulatory Visit: Payer: Self-pay | Admitting: Medical

## 2015-07-26 DIAGNOSIS — R5383 Other fatigue: Secondary | ICD-10-CM

## 2015-07-26 NOTE — Telephone Encounter (Signed)
reglan refilled, put order in for CBC

## 2015-07-26 NOTE — Telephone Encounter (Signed)
LMTCB

## 2015-07-26 NOTE — Telephone Encounter (Signed)
Call and see if she notes improvements on the Reglan for nausea. I had her try this over the past month and received refill request.

## 2015-07-26 NOTE — Telephone Encounter (Signed)
Is this ok to refill?  

## 2015-07-26 NOTE — Telephone Encounter (Signed)
Pt is aware.  

## 2015-07-26 NOTE — Telephone Encounter (Signed)
Pt states that she cant handle without it said is was better with it. Has been feeling yucky so is wanting to come in for just a CBC today or next week

## 2015-07-29 ENCOUNTER — Other Ambulatory Visit: Payer: PPO

## 2015-07-29 DIAGNOSIS — R5383 Other fatigue: Secondary | ICD-10-CM | POA: Diagnosis not present

## 2015-07-29 LAB — CBC WITH DIFFERENTIAL/PLATELET
BASOS PCT: 1 % (ref 0–1)
Basophils Absolute: 0 10*3/uL (ref 0.0–0.1)
Eosinophils Absolute: 0 10*3/uL (ref 0.0–0.7)
Eosinophils Relative: 1 % (ref 0–5)
HEMATOCRIT: 36.8 % (ref 36.0–46.0)
HEMOGLOBIN: 12.5 g/dL (ref 12.0–15.0)
LYMPHS ABS: 1.3 10*3/uL (ref 0.7–4.0)
Lymphocytes Relative: 36 % (ref 12–46)
MCH: 30 pg (ref 26.0–34.0)
MCHC: 34 g/dL (ref 30.0–36.0)
MCV: 88.5 fL (ref 78.0–100.0)
MONO ABS: 0.3 10*3/uL (ref 0.1–1.0)
MONOS PCT: 7 % (ref 3–12)
MPV: 9.2 fL (ref 8.6–12.4)
NEUTROS ABS: 2 10*3/uL (ref 1.7–7.7)
Neutrophils Relative %: 55 % (ref 43–77)
Platelets: 144 10*3/uL — ABNORMAL LOW (ref 150–400)
RBC: 4.16 MIL/uL (ref 3.87–5.11)
RDW: 13.8 % (ref 11.5–15.5)
WBC: 3.6 10*3/uL — ABNORMAL LOW (ref 4.0–10.5)

## 2015-07-31 ENCOUNTER — Telehealth: Payer: Self-pay | Admitting: Family Medicine

## 2015-07-31 NOTE — Telephone Encounter (Signed)
Pt said since she has been off of the Atenolol she keeps a nagging headache and dosen't feel good.  She wants to start back on it.  Please send to Maggie Font.  Pt ph [phone]

## 2015-08-01 ENCOUNTER — Other Ambulatory Visit: Payer: Self-pay | Admitting: Medical

## 2015-08-01 MED ORDER — ATENOLOL 25 MG PO TABS: ORAL_TABLET | ORAL | Status: DC

## 2015-08-01 NOTE — Telephone Encounter (Signed)
done

## 2015-08-08 DIAGNOSIS — L821 Other seborrheic keratosis: Secondary | ICD-10-CM | POA: Diagnosis not present

## 2015-08-08 DIAGNOSIS — Z23 Encounter for immunization: Secondary | ICD-10-CM | POA: Diagnosis not present

## 2015-08-08 DIAGNOSIS — Z85828 Personal history of other malignant neoplasm of skin: Secondary | ICD-10-CM | POA: Diagnosis not present

## 2015-08-08 DIAGNOSIS — L570 Actinic keratosis: Secondary | ICD-10-CM | POA: Diagnosis not present

## 2015-08-21 ENCOUNTER — Encounter: Payer: Self-pay | Admitting: Medical

## 2015-09-16 ENCOUNTER — Ambulatory Visit (INDEPENDENT_AMBULATORY_CARE_PROVIDER_SITE_OTHER): Payer: PPO | Admitting: Medical

## 2015-09-16 ENCOUNTER — Encounter: Payer: Self-pay | Admitting: Medical

## 2015-09-16 VITALS — BP 108/76 | HR 93 | Temp 98.2°F | Resp 16 | Wt 157.0 lb

## 2015-09-16 DIAGNOSIS — R197 Diarrhea, unspecified: Secondary | ICD-10-CM

## 2015-09-16 DIAGNOSIS — R112 Nausea with vomiting, unspecified: Secondary | ICD-10-CM | POA: Diagnosis not present

## 2015-09-16 DIAGNOSIS — R109 Unspecified abdominal pain: Secondary | ICD-10-CM | POA: Diagnosis not present

## 2015-09-16 LAB — POCT URINALYSIS DIPSTICK
Blood, UA: NEGATIVE
Glucose, UA: NEGATIVE
Nitrite, UA: NEGATIVE
Spec Grav, UA: >=1.03
Urobilinogen, UA: 0.2
pH, UA: 6

## 2015-09-16 MED ORDER — ONDANSETRON HCL 4 MG PO TABS: 4.0000 mg | ORAL_TABLET | Freq: Three times a day (TID) | ORAL | Status: DC | PRN

## 2015-09-16 NOTE — Progress Notes (Signed)
Subjective: Chief Complaint  Patient presents with  . nausea/dizzy    throwing up, diarrhea, said that it started thursday night. back pain   Here for not feeling well.   Since 4 days ago was having abdominal pain, diarrhea, nausea and vomiting.   Had fever 2 days ago.   No more vomiting in last 2 days.   Having diarrhea stools worse saturday and Sunday, but today Monday not as bad.   No blood or mucous in the stool.   Back hurts some too.   No urinary issues, no urinary burning, no blood in urine.   No sick contacts with stomach flu recently.  Feels fatigued.  No other symptoms.   Been using the Reglan.   Took some of her daughter's phenergan yesterday.  Drank a little ginger ale. Hasn't been eating a much.  [place]  Has been sick since then until now.  No other aggravating or relieving factors. No other complaint.  Past Medical History  Diagnosis Date  . Insomnia   . Anxiety   . Migraines     Dr. Melton Alar, migraines  . Allergy   . Asthma   . Nearsightedness     wears glasses  . Eye injury     in college, some pertinent loss of vision in left eye  . Genital herpes   . Hypertension 12/2011  . History of mammogram     Dr. Radene Knee  . Routine gynecological examination     Dr. Radene Knee  . History of bone density study 09/2011    normal  . History of PFTs 01/20/2011    moderate airflow limitation, no significant response to bronchodilator  . Adenomatous colon polyp   . Diverticulosis   . History of melanoma     sees [facility], dermatology  . Atherosclerosis of aorta ([place]) 03/2014    abnormal on ultrasound, repeat US within 5 years  . Anemia   . Zika virus disease 06/2014  . Depression   . GERD (gastroesophageal reflux disease)    ROS as in subjective   Objective: BP 108/76 mmHg  Pulse 93  Temp(Src) 98.2 F (36.8 C) (Tympanic)  Resp 16  Wt 157 lb (71.215 kg)  General appearance: alert, no distress, WD/WN, somewhat ill  appearing Neck: supple, no lymphadenopathy, no thyromegaly, no masses Heart: RRR, normal S1, S2, no murmurs Lungs: CTA bilaterally, no wheezes, rhonchi, or rales Abdomen: +increased bs throughout, mildly tender throughout, soft non distended, no masses, no hepatomegaly, no splenomegaly Back: nontender Pulses: 2+ symmetric, upper and lower extremities, normal cap refill    Assessment: Encounter Diagnoses  Name Primary?  . Abdominal pain, unspecified abdominal location Yes  . Nausea and vomiting, intractability of vomiting not specified, unspecified vomiting type   . Diarrhea, unspecified type      Plan: urinalysis reviewed.  symptoms and exam suggest viral gastroenteritis vs other causes.   She has had some chronic nausea issues improved with Reglan.  She has hx/o diverticulosis but no obvious diverticulitis today.  No recent travel or c diff exposure, no recent antibiotic use.   Advised clear fluids, rest, BRAT diet if tolerated today and tomorrow, can use Zofran for nausea, and if worse, not improving or new symptoms within next 2-3 days, then call back.  Work note given.

## 2015-09-18 LAB — URINE CULTURE

## 2015-09-30 ENCOUNTER — Other Ambulatory Visit: Payer: Self-pay | Admitting: Medical

## 2015-09-30 NOTE — Telephone Encounter (Signed)
Phoned in.

## 2015-09-30 NOTE — Telephone Encounter (Signed)
Is this ok to refill?  

## 2015-09-30 NOTE — Telephone Encounter (Signed)
Call in [place]

## 2015-10-09 ENCOUNTER — Encounter: Payer: Self-pay | Admitting: Medical

## 2015-10-09 ENCOUNTER — Ambulatory Visit (INDEPENDENT_AMBULATORY_CARE_PROVIDER_SITE_OTHER): Payer: PPO | Admitting: Medical

## 2015-10-09 VITALS — BP 120/78 | HR 54 | Wt 160.0 lb

## 2015-10-09 DIAGNOSIS — R1084 Generalized abdominal pain: Secondary | ICD-10-CM

## 2015-10-09 DIAGNOSIS — R21 Rash and other nonspecific skin eruption: Secondary | ICD-10-CM

## 2015-10-09 DIAGNOSIS — L259 Unspecified contact dermatitis, unspecified cause: Secondary | ICD-10-CM

## 2015-10-09 DIAGNOSIS — G8929 Other chronic pain: Secondary | ICD-10-CM | POA: Diagnosis not present

## 2015-10-09 MED ORDER — HYDROCORTISONE 2.5 % EX OINT: TOPICAL_OINTMENT | Freq: Two times a day (BID) | CUTANEOUS | Status: DC

## 2015-10-09 NOTE — Progress Notes (Signed)
Subjective: Chief Complaint  Patient presents with  . rash under lt eye    itches. not sure what caused it. has a headache. used cortisone cream and vasoline.    Here for c/o rash under left eye since Monday 2 days ago.  Has used Vaseline, hydrocortisone cream.     Still has a lot of stomach issues.  Doesn't do well with any food with tannic acids, onions, garlic, doesn't eat any meat, doesn't eat much dairy.  Avoids oils.   Has been evaluated by GI, had negative celiac tests.   Has had some allergy testing.   Has tried some diets that Dr. Carlean Purl has given her.   Has done elimination diet.   Wants advice   Past Medical History  Diagnosis Date  . Insomnia   . Anxiety   . Migraines     Dr. Melton Alar, migraines  . Allergy   . Asthma   . Nearsightedness     wears glasses  . Eye injury     in college, some pertinent loss of vision in left eye  . Genital herpes   . Hypertension 12/2011  . History of mammogram     Dr. Radene Knee  . Routine gynecological examination     Dr. Radene Knee  . History of bone density study 09/2011    normal  . History of PFTs 01/20/2011    moderate airflow limitation, no significant response to bronchodilator  . Adenomatous colon polyp   . Diverticulosis   . History of melanoma     sees [facility], dermatology  . Atherosclerosis of aorta ([place]) 03/2014    abnormal on ultrasound, repeat US within 5 years  . Anemia   . Zika virus disease 06/2014  . Depression   . GERD (gastroesophageal reflux disease)    ROS as in subjective   Objective: BP 120/78 mmHg  Pulse 54  Wt 160 lb (72.576 kg)  Gen: wd,wn, nad Puffy pink red rash under both orbits, no warmth or drainage, no induration or fluctuance   Assessment: Encounter Diagnoses  Name Primary?  . Rash and nonspecific skin eruption Yes  . Contact dermatitis   . Abdominal pain, chronic, generalized      Plan: Likely contact dermatitis.  Begin Hydrocortisone ointment, use soap and water for hygiene and if  not resolving within 3-4 days, then return  Chronic abdomina pain - begin trial of Oherra probiotic.  Reviewed prior labs, GI notes.

## 2015-10-14 ENCOUNTER — Encounter: Payer: Self-pay | Admitting: Internal Medicine

## 2015-10-18 ENCOUNTER — Other Ambulatory Visit: Payer: Self-pay | Admitting: Internal Medicine

## 2015-10-18 MED ORDER — ONDANSETRON HCL 4 MG PO TABS: 4.0000 mg | ORAL_TABLET | Freq: Three times a day (TID) | ORAL | Status: DC | PRN

## 2015-10-21 DIAGNOSIS — H53002 Unspecified amblyopia, left eye: Secondary | ICD-10-CM | POA: Diagnosis not present

## 2015-10-21 DIAGNOSIS — H524 Presbyopia: Secondary | ICD-10-CM | POA: Diagnosis not present

## 2015-10-21 DIAGNOSIS — H5203 Hypermetropia, bilateral: Secondary | ICD-10-CM | POA: Diagnosis not present

## 2015-10-30 ENCOUNTER — Encounter: Payer: Self-pay | Admitting: Medical

## 2015-12-26 ENCOUNTER — Encounter: Payer: Self-pay | Admitting: Internal Medicine

## 2015-12-26 ENCOUNTER — Telehealth: Payer: Self-pay

## 2015-12-26 ENCOUNTER — Ambulatory Visit (INDEPENDENT_AMBULATORY_CARE_PROVIDER_SITE_OTHER): Payer: PPO | Admitting: Internal Medicine

## 2015-12-26 VITALS — BP 102/60 | HR 74 | Ht 66.25 in | Wt 158.0 lb

## 2015-12-26 DIAGNOSIS — K589 Irritable bowel syndrome without diarrhea: Secondary | ICD-10-CM | POA: Diagnosis not present

## 2015-12-26 DIAGNOSIS — K591 Functional diarrhea: Secondary | ICD-10-CM | POA: Diagnosis not present

## 2015-12-26 DIAGNOSIS — R1013 Epigastric pain: Secondary | ICD-10-CM

## 2015-12-26 MED ORDER — ZOLPIDEM TARTRATE 10 MG PO TABS: ORAL_TABLET | ORAL | 0 refills | Status: DC

## 2015-12-26 MED ORDER — METOCLOPRAMIDE HCL 5 MG PO TABS: 5.0000 mg | ORAL_TABLET | Freq: Three times a day (TID) | ORAL | 2 refills | Status: DC | PRN

## 2015-12-26 NOTE — Progress Notes (Signed)
   Subjective:    Patient ID: Shelby Randolph, female    DOB: 03/02/1951, 65 y.o.   MRN: [id number] Cc: f/u diarrhea HPI Has been better on ondansetron, probiotics and also taking Reglan. Was experiencing recurrence of diarrhea problems - post-prandial abd discomfort (I feel awful after I eat) and messaged me by My Chart. Reglan had been ordered before by PCP for nausea and vomiting. She is sig better but not 100%  About to go to [facility] w/ 8 grandsons and 5 kds/spouses soon.  Wt Readings from Last 3 Encounters:  12/26/15 158 lb (71.7 kg)  10/09/15 160 lb (72.6 kg)  09/16/15 157 lb (71.2 kg)   To go to end of [place] in [place] in Sept Review of Systems As above    Objective:   Physical Exam BP 102/60   Pulse 74   Ht 5' 6.25" (1.683 m)   Wt 158 lb (71.7 kg)   BMI 25.31 kg/m  NAD     Assessment & Plan:   Encounter Diagnoses  Name Primary?  . IBS (irritable bowel syndrome) Yes  . Dyspepsia   . Functional diarrhea     Better - would stop Reglan Will see if ondansetron alone will work - probiotic is $46 a month  I have ?ed if she could have sucrase-isomaltase deficiency but have not ordered the test - will sort that out and arrange. May not be so as she thinks carbs ok and protein/meat not - but can tolerate fish.

## 2015-12-26 NOTE — Patient Instructions (Signed)
  Try and take your reglan as needed.   Try and stop your probiotic.   Continue the ondansetron for your diarrhea.    Dr. Carlean Purl is going to look into GSID testing and will be in touch about this.     I appreciate the opportunity to care for you. Silvano Rusk, MD, [facility]

## 2015-12-26 NOTE — Telephone Encounter (Signed)
Needs refill of [name]- she is going out of town for 2 weeks. Shelby Randolph

## 2015-12-26 NOTE — Telephone Encounter (Signed)
Refilled electronically and phoned in

## 2015-12-26 NOTE — Telephone Encounter (Signed)
Call [facility] out

## 2015-12-28 ENCOUNTER — Encounter: Payer: Self-pay | Admitting: Internal Medicine

## 2016-01-08 DIAGNOSIS — L820 Inflamed seborrheic keratosis: Secondary | ICD-10-CM | POA: Diagnosis not present

## 2016-01-29 ENCOUNTER — Telehealth: Payer: Self-pay

## 2016-01-29 MED ORDER — ZOLPIDEM TARTRATE 10 MG PO TABS: ORAL_TABLET | ORAL | 3 refills | Status: DC

## 2016-01-29 NOTE — Telephone Encounter (Signed)
Call out [facility] with 3 refills, order it electronically in the system as well

## 2016-01-29 NOTE — Telephone Encounter (Signed)
Called to pharmacy 

## 2016-01-29 NOTE — Telephone Encounter (Signed)
Request refill of ambien to [facility]. [phone].

## 2016-02-05 ENCOUNTER — Encounter: Payer: Self-pay | Admitting: Internal Medicine

## 2016-02-17 ENCOUNTER — Other Ambulatory Visit: Payer: Self-pay | Admitting: Internal Medicine

## 2016-02-27 ENCOUNTER — Telehealth: Payer: Self-pay

## 2016-02-27 NOTE — Telephone Encounter (Signed)
Patient notified that we have sent an order for her to [facility] for a Sucrose Breath Test.  She is notified that she will receive the kit in the mail and she will perform the test and send it back.  She will call back for any questions or concerns

## 2016-03-05 DIAGNOSIS — Z23 Encounter for immunization: Secondary | ICD-10-CM | POA: Diagnosis not present

## 2016-03-23 ENCOUNTER — Other Ambulatory Visit: Payer: Self-pay | Admitting: Internal Medicine

## 2016-03-23 NOTE — Telephone Encounter (Signed)
Refill x 3 ok 

## 2016-03-23 NOTE — Telephone Encounter (Signed)
May I refill [name], thank you. 

## 2016-03-26 ENCOUNTER — Encounter: Payer: Self-pay | Admitting: Internal Medicine

## 2016-04-20 ENCOUNTER — Encounter: Payer: Self-pay | Admitting: Internal Medicine

## 2016-04-22 DIAGNOSIS — D485 Neoplasm of uncertain behavior of skin: Secondary | ICD-10-CM | POA: Diagnosis not present

## 2016-04-22 DIAGNOSIS — L821 Other seborrheic keratosis: Secondary | ICD-10-CM | POA: Diagnosis not present

## 2016-05-15 ENCOUNTER — Other Ambulatory Visit: Payer: Self-pay | Admitting: Medical

## 2016-05-27 ENCOUNTER — Other Ambulatory Visit: Payer: Self-pay | Admitting: Medical

## 2016-05-28 NOTE — Telephone Encounter (Signed)
I called in the medication

## 2016-05-28 NOTE — Telephone Encounter (Signed)
Is this okay to refill? 

## 2016-06-02 DIAGNOSIS — Z6824 Body mass index (BMI) 24.0-24.9, adult: Secondary | ICD-10-CM | POA: Diagnosis not present

## 2016-06-02 DIAGNOSIS — Z1231 Encounter for screening mammogram for malignant neoplasm of breast: Secondary | ICD-10-CM | POA: Diagnosis not present

## 2016-06-02 DIAGNOSIS — Z124 Encounter for screening for malignant neoplasm of cervix: Secondary | ICD-10-CM | POA: Diagnosis not present

## 2016-06-10 ENCOUNTER — Ambulatory Visit (INDEPENDENT_AMBULATORY_CARE_PROVIDER_SITE_OTHER): Payer: PPO | Admitting: Internal Medicine

## 2016-06-10 ENCOUNTER — Encounter: Payer: Self-pay | Admitting: Internal Medicine

## 2016-06-10 VITALS — BP 106/74 | HR 72 | Ht 66.25 in | Wt 156.1 lb

## 2016-06-10 DIAGNOSIS — K580 Irritable bowel syndrome with diarrhea: Secondary | ICD-10-CM

## 2016-06-10 MED ORDER — ALOSETRON HCL 0.5 MG PO TABS: 0.5000 mg | ORAL_TABLET | Freq: Two times a day (BID) | ORAL | 0 refills | Status: DC

## 2016-06-10 NOTE — Progress Notes (Signed)
   Shelby Randolph 66 y.o. [birth year] [id number]  Assessment & Plan:   Encounter Diagnosis  Name Primary?  . Irritable bowel syndrome with diarrhea Yes    She is better w/ ondansetron prn but still having problems. Does not seem to have as much urgency on ondansetron. Will try alosetron 0.5 mg bid - she is to contact me in 2 weeks w/ sxs update  If not sig better try 1 mg bid  We discussed side effects and need to stop if gets severe abdominal pain/constipation  Viberzi another possible option  Has not gotten sig benefit from Xifaxan in past, FODMAPS does help - could not tolerate breath test for sucrase-isomaltase deficiency and results inaccurate - one thing to consider in future would be to try the table sugar challenge  [id number], DAVID SHANE, PA-C   Subjective:   Chief Complaint: IBS, diarrhea  HPI F/u - still has intermittent loose/watery and urgent stools that are unpredictable at times but she does know foods in [place] family cause problems and tries to avoid. However will eat them sometimes and "pay the price" e.g. cauliflower  Medications, allergies, past medical history, past surgical history, family history and social history are reviewed and updated in the EMR.  Review of Systems Wt Readings from Last 3 Encounters:  06/10/16 156 lb 2 oz (70.8 kg)  12/26/15 158 lb (71.7 kg)  10/09/15 160 lb (72.6 kg)     Objective:   Physical Exam BP 106/74   Pulse 72   Ht 5' 6.25" (1.683 m)   Wt 156 lb 2 oz (70.8 kg)   BMI 25.01 kg/m  NAD

## 2016-06-10 NOTE — Patient Instructions (Signed)
  We have sent the following medications to your pharmacy for you to pick up at your convenience: Lotronex   Please give [place] an update in 2 weeks via the MyChart portal.    Follow up with Dr Carlean Purl in a year or sooner if needed.      I appreciate the opportunity to care for you. Silvano Rusk, MD, [facility]

## 2016-06-18 ENCOUNTER — Encounter: Payer: Self-pay | Admitting: Internal Medicine

## 2016-06-25 ENCOUNTER — Encounter: Payer: Self-pay | Admitting: Medical

## 2016-06-25 ENCOUNTER — Ambulatory Visit (INDEPENDENT_AMBULATORY_CARE_PROVIDER_SITE_OTHER): Payer: PPO | Admitting: Medical

## 2016-06-25 ENCOUNTER — Ambulatory Visit
Admission: RE | Admit: 2016-06-25 | Discharge: 2016-06-25 | Disposition: A | Discharge status: Home / Self Care | Payer: PPO | Source: Ambulatory Visit | Attending: Medical | Admitting: Medical

## 2016-06-25 VITALS — BP 122/70 | HR 48 | Ht 66.0 in | Wt 155.4 lb

## 2016-06-25 DIAGNOSIS — F411 Generalized anxiety disorder: Secondary | ICD-10-CM

## 2016-06-25 DIAGNOSIS — G4700 Insomnia, unspecified: Secondary | ICD-10-CM | POA: Diagnosis not present

## 2016-06-25 DIAGNOSIS — I700 Atherosclerosis of aorta: Secondary | ICD-10-CM | POA: Diagnosis not present

## 2016-06-25 DIAGNOSIS — J309 Allergic rhinitis, unspecified: Secondary | ICD-10-CM

## 2016-06-25 DIAGNOSIS — J4520 Mild intermittent asthma, uncomplicated: Secondary | ICD-10-CM

## 2016-06-25 DIAGNOSIS — S79912A Unspecified injury of left hip, initial encounter: Secondary | ICD-10-CM | POA: Diagnosis not present

## 2016-06-25 DIAGNOSIS — Z0000 Encounter for general adult medical examination without abnormal findings: Secondary | ICD-10-CM

## 2016-06-25 DIAGNOSIS — I10 Essential (primary) hypertension: Secondary | ICD-10-CM

## 2016-06-25 DIAGNOSIS — E785 Hyperlipidemia, unspecified: Secondary | ICD-10-CM

## 2016-06-25 DIAGNOSIS — M25552 Pain in left hip: Secondary | ICD-10-CM

## 2016-06-25 DIAGNOSIS — E559 Vitamin D deficiency, unspecified: Secondary | ICD-10-CM | POA: Diagnosis not present

## 2016-06-25 DIAGNOSIS — K589 Irritable bowel syndrome without diarrhea: Secondary | ICD-10-CM | POA: Diagnosis not present

## 2016-06-25 LAB — COMPREHENSIVE METABOLIC PANEL
ALBUMIN: 4.3 g/dL (ref 3.6–5.1)
ALK PHOS: 49 U/L (ref 33–130)
ALT: 13 U/L (ref 6–29)
AST: 23 U/L (ref 10–35)
BILIRUBIN TOTAL: 0.6 mg/dL (ref 0.2–1.2)
BUN: 16 mg/dL (ref 7–25)
CALCIUM: 9.5 mg/dL (ref 8.6–10.4)
CO2: 27 mmol/L (ref 20–31)
Chloride: 105 mmol/L (ref 98–110)
Creat: 0.82 mg/dL (ref 0.50–0.99)
GLUCOSE: 90 mg/dL (ref 65–99)
POTASSIUM: 4.2 mmol/L (ref 3.5–5.3)
Sodium: 140 mmol/L (ref 135–146)
TOTAL PROTEIN: 7 g/dL (ref 6.1–8.1)

## 2016-06-25 LAB — CBC
HCT: 37.3 % (ref 35.0–45.0)
HEMOGLOBIN: 12.5 g/dL (ref 11.7–15.5)
MCH: 30.3 pg (ref 27.0–33.0)
MCHC: 33.5 g/dL (ref 32.0–36.0)
MCV: 90.3 fL (ref 80.0–100.0)
MPV: 9.3 fL (ref 7.5–12.5)
Platelets: 172 10*3/uL (ref 140–400)
RBC: 4.13 MIL/uL (ref 3.80–5.10)
RDW: 14 % (ref 11.0–15.0)
WBC: 3.9 10*3/uL — AB (ref 4.0–10.5)

## 2016-06-25 LAB — LIPID PANEL
CHOL/HDL RATIO: 2.8 ratio (ref <5.0)
CHOLESTEROL: 223 mg/dL — AB (ref <200)
HDL: 80 mg/dL (ref >50)
LDL Cholesterol: 125 mg/dL — ABNORMAL HIGH (ref <100)
TRIGLYCERIDES: 92 mg/dL (ref <150)
VLDL: 18 mg/dL (ref <30)

## 2016-06-25 LAB — POCT URINALYSIS DIPSTICK
BILIRUBIN UA: NEGATIVE
Blood, UA: NEGATIVE
GLUCOSE UA: NEGATIVE
KETONES UA: NEGATIVE
LEUKOCYTES UA: NEGATIVE
NITRITE UA: NEGATIVE
Protein, UA: NEGATIVE
Spec Grav, UA: >=1.03
Urobilinogen, UA: NEGATIVE
pH, UA: 6

## 2016-06-25 LAB — TSH: TSH: 1.97 mIU/L

## 2016-06-25 MED ORDER — ATENOLOL 25 MG PO TABS: ORAL_TABLET | ORAL | 3 refills | Status: DC

## 2016-06-25 MED ORDER — HYDROCHLOROTHIAZIDE 12.5 MG PO CAPS: ORAL_CAPSULE | ORAL | 3 refills | Status: DC

## 2016-06-25 NOTE — Addendum Note (Signed)
Addended by: Carlena Hurl on: 06/25/2016 11:10 PM   Modules accepted: Level of Service

## 2016-06-25 NOTE — Progress Notes (Signed)
Subjective:    Shelby Randolph is a 66 y.o. female who presents for Preventative Services visit and chronic medical problems/med check visit.    Primary Care Provider Crisoforo Oxford, PA-C here for primary care  Current Health Care Team:  Dentist, Dr Posey Pronto  Eye doctor, Dr. Donato Heinz  Dr. Silvano Rusk, GI  Dr. Radene Knee, [place] you may have received from other than [facility] providers in the past year (date may be approximate) none  Exercise Current exercise habits: walks, spin,yoga  Nutrition/Diet Current diet: yes  Depression Screen Depression screen [facility] 2/9 06/25/2016  Decreased Interest 0  Down, Depressed, Hopeless 0  PHQ - 2 Score 0    Activities of Daily Living Screen/Functional Status Survey    Can patient draw a clock face showing 3:15 o'clock, yes  Fall Risk Screen Fall Risk  06/25/2016 05/23/2015  Falls in the past year? Yes No  Number falls in past yr: 1 -  Injury with Fall? Yes -  Risk for fall due to : Other (Comment) -    Gait Assessment: Normal gait observed yes  Advanced directives Does patient have a [place]? yes Does patient have a Living Will? yes  Past Medical History:  Diagnosis Date  . Adenomatous colon polyp   . Allergy   . Anemia   . Anxiety   . Asthma   . Atherosclerosis of aorta ([place]) 03/2014   abnormal on ultrasound, repeat US within 5 years  . Depression   . Diverticulosis   . Eye injury    in college, some pertinent loss of vision in left eye  . Genital herpes   . GERD (gastroesophageal reflux disease)   . History of bone density study 09/2011   normal  . History of mammogram    Dr. Radene Knee  . History of melanoma    sees [facility], dermatology  . History of PFTs 01/20/2011   moderate airflow limitation, no significant response to bronchodilator  . Hypertension 12/2011  . Insomnia   . Migraines    Dr. Melton Alar, migraines  . Nearsightedness    wears glasses  . Routine  gynecological examination    Dr. Radene Knee  . Zika virus disease 06/2014    Past Surgical History:  Procedure Laterality Date  . COLONOSCOPY  2016   Dr. Silvano Rusk, 2014 Dr. Earlean Shawl  . ESOPHAGOGASTRODUODENOSCOPY    . ROTATOR CUFF REPAIR Right 05/2010  . TONSILLECTOMY AND ADENOIDECTOMY    . VAGINAL HYSTERECTOMY  1998   total; abnormal peroids  . VARICOSE VEIN SURGERY     bilat    Social History   Social History  . Marital status: Widowed    Spouse name: N/A  . Number of children: 5  . Years of education: N/A   Occupational History  . compliance agent Investment banker, corporate) [place]    Nurse Practitioner   Social History Main Topics  . Smoking status: Never Smoker  . Smokeless tobacco: Never Used  . Alcohol use 1.2 oz/week    2 Glasses of wine per week  . Drug use: No  . Sexual activity: Not on file     Comment: exercise - walks, [place], cardio at gym 3 x/wk, yoga, widowed    Other Topics Concern  . 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 COPD Mother     died age 32yo.  emphysema  . Colon cancer Father 94     died age 46yo  . Colon cancer Brother 48  . Breast cancer Maternal Aunt   . Colon cancer Paternal Uncle     2  . Breast cancer Daughter   . Diabetes Neg Hx   . Heart disease Neg Hx   . Hypertension Neg Hx   . Stroke Neg Hx      Current Outpatient Prescriptions:  .  acetaminophen (TYLENOL) 500 MG tablet, 1-2 tablets twice to three times daily as needed., Disp: 100 tablet, Rfl: 5 .  albuterol (PROVENTIL HFA;VENTOLIN HFA) 108 (90 BASE) MCG/ACT inhaler, Inhale 2 puffs into the lungs as needed., Disp: 1 Inhaler, Rfl: 0 .  alosetron (LOTRONEX) 0.5  MG tablet, Take 1 tablet (0.5 mg total) by mouth 2 (two) times daily., Disp: 30 tablet, Rfl: 0 .  atenolol (TENORMIN) 25 MG tablet, TAKE ONE TABLET EACH DAY, Disp: 90 tablet, Rfl: 3 .  cetirizine (ZYRTEC) 10 MG tablet, Take 1 tablet (10 mg total) by mouth daily., Disp: 90 tablet, Rfl: 3 .  hydrochlorothiazide (MICROZIDE) 12.5 MG capsule, TAKE ONE CAPSULE EACH DAY, Disp: 90 capsule, Rfl: 0 .  hydrocortisone 2.5 % ointment, Apply topically 2 (two) times daily. (Patient taking differently: Apply topically as needed. ), Disp: 30 g, Rfl: 0 .  Multiple Vitamin (MULTIVITAMIN) capsule, Take 1 capsule by mouth daily., Disp: , Rfl:  .  zolpidem (AMBIEN) 10 MG tablet, TAKE ONE TABLET AT BEDTIME AS NEEDED FOR SLEEP, Disp: 30 tablet, Rfl: 2  Current Facility-Administered Medications:  .  albuterol (PROVENTIL) (5 MG/ML) 0.5% nebulizer solution 2.5 mg, 2.5 mg, Nebulization, Once, Rita Ohara, MD  Allergies  Allergen Reactions  . Sulfonamide Derivatives Shortness Of Breath and Swelling  . Mucinex [Guaifenesin Er] Other (See Comments)    "Jittery and hypes me up"    History reviewed: allergies, current medications, past family history, past medical history, past social history, past surgical history and problem list  Chronic issues discussed: Asthma - no recent issues.  Doing fine  compliant with BP medication  Insomnia - uses sleep aid periodically  Acute issues discussed: [place] a few weeks ago, still has some left hip pain.    Objective:     Biometrics BP 122/70   Pulse (!) 48   Ht 5\' 6"  (1.676 m)   Wt 155 lb 6.4 oz (70.5 kg)   SpO2 98%   BMI 25.08 kg/m   Wt Readings from Last 3 Encounters:  06/25/16 155 lb 6.4 oz (70.5 kg)  06/10/16 156 lb 2 oz (70.8 kg)  12/26/15 158 lb (71.7 kg)    Cognitive Testing  Alert? Yes  Normal Appearance?Yes  Oriented to person? Yes  Place? Yes   Time? Yes  Recall of three objects?  Yes  Can perform simple calculations? Yes  Displays appropriate  judgment?Yes  Can read the correct time from a watch face?Yes  General appearance: alert, no distress, WD/WN, white female  Nutritional Status: Inadequate calore intake? no Loss of muscle mass? no Loss of fat beneath skin? no Localized or general edema? no Diminished functional status? no  Other pertinent exam: HEENT: normocephalic, sclerae anicteric, TMs pearly, nares patent, no discharge or erythema, pharynx normal Oral cavity: MMM, no  lesions Neck: supple, no lymphadenopathy, no thyromegaly, no masses Heart: RRR, normal S1, S2, no murmurs Lungs: CTA bilaterally, no wheezes, rhonchi, or rales Abdomen: +bs, soft, non tender, non distended, no masses, no hepatomegaly, no splenomegaly Musculoskeletal: tender left hip generalized, some pain with left hip internal ROM which is mildly reduced, left hip tender with external ROM with is WNL.  otherwise LE nontender, no swelling, no obvious deformity Extremities: no edema, no cyanosis, no clubbing, mild to moderate LE varicosities Pulses: 2+ symmetric, upper and lower extremities, normal cap refill Neurological: alert, oriented x 3, CN2-12 intact, strength normal upper extremities and lower extremities, sensation normal throughout, DTRs 2+ throughout, no cerebellar signs, gait normal Psychiatric: normal affect, behavior normal, pleasant  Skin: scattered macules, no worrisome lesions   Assessment:   Encounter Diagnoses  Name Primary?  . Routine general medical examination at a health care facility Yes  . Essential hypertension   . Mild intermittent asthma without complication   . Allergic rhinitis, unspecified chronicity, unspecified seasonality, unspecified trigger   . Atherosclerosis of abdominal aorta ([place])   . Irritable bowel syndrome, unspecified type   . Left hip pain   . Generalized anxiety disorder   . Vitamin D deficiency   . Insomnia, unspecified type   . Dyslipidemia   . Encounter for health maintenance examination in  adult      Plan:   A preventative services visit was completed today.  During the course of the visit today, we discussed and counseled about appropriate screening and preventive services.  A health risk assessment was established today that included a review of current medications, allergies, social history, family history, medical and preventative health history, biometrics, and preventative screenings to identify potential safety concerns or impairments.  A personalized plan was printed today for your records and use.   Personalized health advice and education was given today to reduce health risks and promote self management and wellness.  Information regarding end of life planning was discussed today.  Conditions/risks identified: Hip pain IBS - managed by GI  Chronic problems discussed today: HTN - c/t same medication Insomnia - c/t Ambien prn Asthma - no current issues  Acute problems discussed today: Hip pain - go for xray  Recommendations:  I recommend a yearly ophthalmology/optometry visit for glaucoma screening and eye checkup  I recommended a yearly dental visit for hygiene and checkup  Advanced directives - discussed nature and purpose of Advanced Directives, encouraged them to complete them if they have not done so and/or encouraged them to get [place] a copy if they have done this already.  UTD on mammogram /pap through gynecology  We will request copy of her most recent bone density scan  She will consider medication regarding possible osteopenia   Referrals today: none  Immunizations: I recommended a yearly influenza vaccine, typically in September when the vaccine is usually available Is the Pneumococcal vaccine up to date: yes. Is the Shingles vaccine up to date: yes.   Is the Td/Tdap vaccine up to date: yes.   Medicare Attestation A preventative services visit was completed today.  During the course of the visit the patient was educated and counseled  about appropriate screening and preventive services.  A health risk assessment was established with the patient that included a review of current medications, allergies, social history, family history, medical and preventative health history, biometrics, and preventative screenings to identify potential safety concerns or impairments.  A personalized plan was printed today for the patient's records  and use.   Personalized health advice and education was given today to reduce health risks and promote self management and wellness.  Information regarding end of life planning was discussed today.  Crisoforo Oxford, PA-C   06/25/2016

## 2016-06-26 ENCOUNTER — Other Ambulatory Visit: Payer: Self-pay | Admitting: Medical

## 2016-06-26 LAB — VITAMIN D 25 HYDROXY (VIT D DEFICIENCY, FRACTURES): VIT D 25 HYDROXY: 35 ng/mL (ref 30–100)

## 2016-06-29 ENCOUNTER — Other Ambulatory Visit: Payer: Self-pay | Admitting: Medical

## 2016-06-29 MED ORDER — PRAVASTATIN SODIUM 20 MG PO TABS
20.0000 mg | ORAL_TABLET | Freq: Every day | ORAL | 0 refills | Status: DC
Start: 2016-06-29 — End: 2017-07-09

## 2016-07-02 ENCOUNTER — Encounter: Payer: Self-pay | Admitting: Medical

## 2016-07-14 ENCOUNTER — Other Ambulatory Visit: Payer: Self-pay | Admitting: Internal Medicine

## 2016-07-26 ENCOUNTER — Other Ambulatory Visit: Payer: Self-pay | Admitting: Internal Medicine

## 2016-08-12 DIAGNOSIS — L820 Inflamed seborrheic keratosis: Secondary | ICD-10-CM | POA: Diagnosis not present

## 2016-08-12 DIAGNOSIS — L814 Other melanin hyperpigmentation: Secondary | ICD-10-CM | POA: Diagnosis not present

## 2016-08-12 DIAGNOSIS — Z85828 Personal history of other malignant neoplasm of skin: Secondary | ICD-10-CM | POA: Diagnosis not present

## 2016-08-12 DIAGNOSIS — D1801 Hemangioma of skin and subcutaneous tissue: Secondary | ICD-10-CM | POA: Diagnosis not present

## 2016-08-12 DIAGNOSIS — L821 Other seborrheic keratosis: Secondary | ICD-10-CM | POA: Diagnosis not present

## 2016-08-12 DIAGNOSIS — D225 Melanocytic nevi of trunk: Secondary | ICD-10-CM | POA: Diagnosis not present

## 2016-08-14 ENCOUNTER — Ambulatory Visit (INDEPENDENT_AMBULATORY_CARE_PROVIDER_SITE_OTHER): Payer: PPO | Admitting: Family Medicine

## 2016-08-14 ENCOUNTER — Ambulatory Visit
Admission: RE | Admit: 2016-08-14 | Discharge: 2016-08-14 | Disposition: A | Discharge status: Home / Self Care | Payer: PPO | Source: Ambulatory Visit | Attending: Family Medicine | Admitting: Family Medicine

## 2016-08-14 ENCOUNTER — Encounter: Payer: Self-pay | Admitting: Family Medicine

## 2016-08-14 VITALS — BP 130/70 | HR 57 | Temp 97.8°F | Resp 16 | Wt 155.6 lb

## 2016-08-14 DIAGNOSIS — Z8701 Personal history of pneumonia (recurrent): Secondary | ICD-10-CM

## 2016-08-14 DIAGNOSIS — R5381 Other malaise: Secondary | ICD-10-CM | POA: Diagnosis not present

## 2016-08-14 DIAGNOSIS — R5383 Other fatigue: Secondary | ICD-10-CM | POA: Diagnosis not present

## 2016-08-14 DIAGNOSIS — J4520 Mild intermittent asthma, uncomplicated: Secondary | ICD-10-CM

## 2016-08-14 DIAGNOSIS — R058 Other specified cough: Secondary | ICD-10-CM

## 2016-08-14 DIAGNOSIS — R05 Cough: Secondary | ICD-10-CM | POA: Diagnosis not present

## 2016-08-14 MED ORDER — PREDNISONE 10 MG (21) PO TBPK: ORAL_TABLET | Freq: Every day | ORAL | 0 refills | Status: DC

## 2016-08-14 MED ORDER — AZITHROMYCIN 250 MG PO TABS: ORAL_TABLET | ORAL | 0 refills | Status: DC

## 2016-08-14 NOTE — Patient Instructions (Signed)
Start the antibiotic. Stay well hydrated.  You can take the Delsym for cough or try the Tessalon you have at home.  Use your albuterol inhaler as needed.  If you are not feeling better over the weekend then you can start the steroid dose pack. I sent this to your pharmacy as well.   Let me know if you are not back to baseline after day 10 of starting the antibiotic.

## 2016-08-14 NOTE — Progress Notes (Signed)
Subjective: Chief Complaint  Patient presents with  . sick    congestion, headache, drainage, coughing up mucous, sore throat from drainage, feels its in chest now, had penumonia couple times before     Shelby Randolph is a 66 y.o. female who presents for a 1 week history chest congestion, cough that is worsening and is productive of thick mucous, fatigue, frontal headache, body aches, and ear fullness.   Denies fever, chills, chest pain, palpitations, shortness of breath, wheezing, abdominal pain, vomiting or diarrhea. No LE edema.   Does not smoke. History of asthma, allergies, and pneumonia.  Takes Zyrtec daily.  Has not used her albuterol inhaler.  No recent antibiotics.  Speaking in complete sentences but frequent coughing.   Treatment to date: Delsym, zyrtec, tylenol .  Denies sick contacts.  No other aggravating or relieving factors.  No other c/o.  ROS as in subjective.   Objective: Vitals:   08/14/16 1027  BP: 130/70  Pulse: (!) 57  Resp: 16  Temp: 97.8 F (36.6 C)    General appearance: Alert, WD/WN, no distress, mildly ill appearing                             Skin: warm, no rash                           Head: no sinus tenderness                            Eyes: conjunctiva normal, corneas clear, PERRLA                            Ears: pearly TMs, external ear canals normal                          Nose: septum midline, turbinates swollen, with erythema and no discharge             Mouth/throat: MMM, tongue normal, mild pharyngeal erythema                           Neck: supple, no adenopathy, no thyromegaly, nontender                          Heart: RRR, normal S1, S2, no murmurs                         Lungs: diminished lung sounds in RLL, otherwise clear, no wheezes, rales, or rhonchi      Assessment: Productive cough - Plan: DG Chest 2 View, azithromycin (ZITHROMAX Z-PAK) 250 MG tablet  Malaise and fatigue  History of pneumonia - Plan: DG Chest 2  View  Mild intermittent asthma without complication    Plan: Discussed diagnosis and treatment of cough. Plan to send her for a chest XR. She has a history of pneumonia in the distant past. Will start her on Z-pack. Is not having an acute asthma flare currently but she has her albuterol inhaler at home to use. She may continue taking Delsym or use the Tessalon she has at home. Sent a steroid dose pack to pharmacy in case she gets worse over the weekend.  Recommend rest, increasing  fluids.  Nasal saline spray for congestion.  Tylenol or Ibuprofen OTC for fever and malaise.  Call/return if she worsens or is not back to baseline day 10 after starting Z-pack.

## 2016-08-20 ENCOUNTER — Encounter: Payer: Self-pay | Admitting: Medical

## 2016-08-20 ENCOUNTER — Ambulatory Visit (INDEPENDENT_AMBULATORY_CARE_PROVIDER_SITE_OTHER): Payer: PPO | Admitting: Medical

## 2016-08-20 VITALS — BP 110/60 | HR 53 | Temp 98.0°F | Wt 156.2 lb

## 2016-08-20 DIAGNOSIS — B001 Herpesviral vesicular dermatitis: Secondary | ICD-10-CM

## 2016-08-20 DIAGNOSIS — J4541 Moderate persistent asthma with (acute) exacerbation: Secondary | ICD-10-CM

## 2016-08-20 DIAGNOSIS — R05 Cough: Secondary | ICD-10-CM

## 2016-08-20 DIAGNOSIS — R059 Cough, unspecified: Secondary | ICD-10-CM

## 2016-08-20 MED ORDER — HYDROCOD POLST-CPM POLST ER 10-8 MG/5ML PO SUER: 5.0000 mL | Freq: Two times a day (BID) | ORAL | 0 refills | Status: DC | PRN

## 2016-08-20 MED ORDER — UMECLIDINIUM BROMIDE 62.5 MCG/INH IN AEPB: 1.0000 | INHALATION_SPRAY | Freq: Every day | RESPIRATORY_TRACT | 0 refills | Status: DC

## 2016-08-20 NOTE — Progress Notes (Signed)
Subjective: Chief Complaint  Patient presents with  . coughing congestion    coughing, congestion , headaches   Here for recheck on cough, congestion.  Was seen here recently with Jocelyn Lamer NP for same.   She notes she has been working in her new house remodeling, removing old carpets, around lots of dust.  Was even using a respiratory mask without other protection to tear up old floors that likely had some asbestos particles.  Tried to be careful, but thinks the dust has flared up her lungs.   From last visit completed zpak, round of prednisone, but the prednisone gives her hyperactivity and messes up her stomach.  She is also using Symbicort 2 puffs BID, albuterol prn but this also makes her hyper feeling.     Still having cough, tightness in chest, ongoing headaches, worse in morning.  Cough settles down in the day but worse again in the night.    Has cold sore forming as of this morning.  Has acyclovir at home  No other aggravating or relieving factors. No other complaint.  Past Medical History:  Diagnosis Date  . Adenomatous colon polyp   . Allergy   . Anemia   . Anxiety   . Asthma   . Atherosclerosis of aorta ([place]) 03/2014   abnormal on ultrasound, repeat US within 5 years  . Depression   . Diverticulosis   . Eye injury    in college, some pertinent loss of vision in left eye  . Genital herpes   . GERD (gastroesophageal reflux disease)   . History of bone density study 09/2011   normal  . History of mammogram    Dr. Radene Knee  . History of melanoma    sees [facility], dermatology  . History of PFTs 01/20/2011   moderate airflow limitation, no significant response to bronchodilator  . Hypertension 12/2011  . Insomnia   . Migraines    Dr. Melton Alar, migraines  . Nearsightedness    wears glasses  . Routine gynecological examination    Dr. Radene Knee  . Zika virus disease 06/2014   Current Outpatient Prescriptions on File Prior to Visit  Medication Sig Dispense Refill  .  acetaminophen (TYLENOL) 500 MG tablet 1-2 tablets twice to three times daily as needed. 100 tablet 5  . albuterol (PROVENTIL HFA;VENTOLIN HFA) 108 (90 BASE) MCG/ACT inhaler Inhale 2 puffs into the lungs as needed. 1 Inhaler 0  . atenolol (TENORMIN) 25 MG tablet TAKE ONE TABLET EACH DAY 90 tablet 3  . cetirizine (ZYRTEC) 10 MG tablet Take 1 tablet (10 mg total) by mouth daily. 90 tablet 3  . hydrochlorothiazide (MICROZIDE) 12.5 MG capsule TAKE ONE CAPSULE EACH DAY 90 capsule 3  . hydrocortisone 2.5 % ointment Apply topically 2 (two) times daily. 30 g 0  . Multiple Vitamin (MULTIVITAMIN) capsule Take 1 capsule by mouth daily.    . ondansetron (ZOFRAN) 4 MG tablet TAKE ONE TABLET EVERY EIGHT HOURS AS NEEDED FOR NAUSEA AND VOMITING (AND TO PREVENT DIARRHEA-MAY USE 2 TABLETS ) 60 tablet 0  . pravastatin (PRAVACHOL) 20 MG tablet Take 1 tablet (20 mg total) by mouth at bedtime. 90 tablet 0  . zolpidem (AMBIEN) 10 MG tablet TAKE ONE TABLET AT BEDTIME AS NEEDED FOR SLEEP 30 tablet 2   Current Facility-Administered Medications on File Prior to Visit  Medication Dose Route Frequency Provider Last Rate Last Dose  . albuterol (PROVENTIL) (5 MG/ML) 0.5% nebulizer solution 2.5 mg  2.5 mg Nebulization Once Rita Ohara, MD  Review of Systems Constitutional: -fever, -chills, -sweats, -unexpected weight change,-fatigue ENT: -runny nose, -ear pain, -sore throat Cardiology:  -chest pain, -palpitations, -edema Gastroenterology: -abdominal pain, -nausea, -vomiting, -diarrhea, -constipation Hematology: -bleeding or bruising problems Musculoskeletal: -arthralgias, -myalgias, -joint swelling, -back pain Ophthalmology: -vision changes Urology: -dysuria, -difficulty urinating, -hematuria, -urinary frequency, -urgency Neurology: -headache, -weakness, -tingling, -numbness    Objective: BP 110/60   Pulse (!) 53   Temp 98 F (36.7 C)   Wt 156 lb 3.2 oz (70.9 kg)   SpO2 96%   BMI 25.21 kg/m   General  appearance: Alert, WD/WN, no distress                             Skin: warm, no rash, no diaphoresis                           Head: no sinus tenderness                            Eyes: conjunctiva normal, corneas clear, PERRLA                            Ears: pearly TMs, external ear canals normal                          Nose: septum midline, turbinates swollen, with erythema and clear discharge             Mouth/throat: MMM, tongue normal, mild pharyngeal erythema                           Neck: supple, no adenopathy, no thyromegaly, non tender                          Heart: RRR, normal S1, S2, no murmurs                         Lungs: +bronchial breath sounds, +scattered rhonchi, no wheezes, no rales                Extremities: no edema, non tender        Assessment: Encounter Diagnoses  Name Primary?  . Moderate persistent reactive airway disease with acute exacerbation Yes  . Cough   . Cold sore      Plan: Lungs seem to be still inflmamed from exposure to dust and particles.   C/t Symbicort 2 puffs BID, albuterol/proventil prn 2 puffs q6 hours, begin 1 week trial of Incruse inhaler sample as demonstrated, begin Tussionex prn.   Rest, hydrate well.  Call/return if worse or not improving in the next 4-5 days  Begin acyclovir she has at home for flare up of cold sore.    Sarrinah was seen today for coughing congestion.  Diagnoses and all orders for this visit:  Moderate persistent reactive airway disease with acute exacerbation  Cough  Cold sore  Other orders -     chlorpheniramine-HYDROcodone (TUSSIONEX PENNKINETIC ER) 10-8 MG/5ML SUER; Take 5 mLs by mouth every 12 (twelve) hours as needed for cough. -     umeclidinium bromide (INCRUSE ELLIPTA) 62.5 MCG/INH AEPB; Inhale 1 puff into the lungs daily.

## 2016-08-26 ENCOUNTER — Other Ambulatory Visit: Payer: Self-pay | Admitting: Medical

## 2016-08-26 ENCOUNTER — Other Ambulatory Visit: Payer: Self-pay | Admitting: Internal Medicine

## 2016-08-31 ENCOUNTER — Other Ambulatory Visit: Payer: Self-pay | Admitting: Internal Medicine

## 2016-09-30 ENCOUNTER — Other Ambulatory Visit: Payer: Self-pay | Admitting: Internal Medicine

## 2016-09-30 NOTE — Telephone Encounter (Signed)
Okay to refill [name], seen in Jan.

## 2016-10-01 NOTE — Telephone Encounter (Signed)
Refill x6

## 2016-10-06 ENCOUNTER — Telehealth: Payer: Self-pay

## 2016-10-06 NOTE — Telephone Encounter (Signed)
Prior Authorization for ondansetron 4mg  tablets was denied.  I called the pharmacy and the patient paid cash for it $17.34 as she needed it and she was going out of town.

## 2016-10-22 ENCOUNTER — Other Ambulatory Visit: Payer: Self-pay | Admitting: Medical

## 2016-10-22 ENCOUNTER — Telehealth: Payer: Self-pay | Admitting: Medical

## 2016-10-22 NOTE — Telephone Encounter (Signed)
Pt called requesting a refill on her Shelby Randolph she is going out of town and leaving at 10 in the morning and would like this first thing in the morning pt uses [facility], [place] - [address]

## 2016-10-22 NOTE — Telephone Encounter (Signed)
Call it out, no refill

## 2016-10-23 ENCOUNTER — Other Ambulatory Visit: Payer: Self-pay

## 2016-10-23 MED ORDER — ZOLPIDEM TARTRATE 10 MG PO TABS: ORAL_TABLET | ORAL | 1 refills | Status: DC

## 2016-10-23 NOTE — Telephone Encounter (Signed)
done

## 2016-10-23 NOTE — Telephone Encounter (Signed)
Can pt have a refill on this 

## 2016-11-06 ENCOUNTER — Ambulatory Visit (INDEPENDENT_AMBULATORY_CARE_PROVIDER_SITE_OTHER): Payer: PPO | Admitting: Medical

## 2016-11-06 VITALS — BP 120/80 | HR 50 | Temp 97.4°F | Wt 153.2 lb

## 2016-11-06 DIAGNOSIS — X32XXXA Exposure to sunlight, initial encounter: Secondary | ICD-10-CM | POA: Diagnosis not present

## 2016-11-06 DIAGNOSIS — E785 Hyperlipidemia, unspecified: Secondary | ICD-10-CM

## 2016-11-06 DIAGNOSIS — L559 Sunburn, unspecified: Secondary | ICD-10-CM

## 2016-11-06 DIAGNOSIS — I10 Essential (primary) hypertension: Secondary | ICD-10-CM | POA: Diagnosis not present

## 2016-11-06 MED ORDER — TRIAMCINOLONE ACETONIDE 0.1 % EX CREA: 1.0000 "application " | TOPICAL_CREAM | Freq: Two times a day (BID) | CUTANEOUS | 0 refills | Status: DC

## 2016-11-06 MED ORDER — PREDNISONE 20 MG PO TABS: 40.0000 mg | ORAL_TABLET | Freq: Every day | ORAL | 0 refills | Status: DC

## 2016-11-06 NOTE — Progress Notes (Signed)
Subjective: Chief Complaint  Patient presents with  . rash on chest.    rash on chest- thinks it might be from the sun- got it a week ago.    Here for for sun poisoning.  Was at the beach this past week, got sun burn, has red splotches, skin flaking, and itching of check.  OTC Hydrocortisone cream makes it burn.   No other aggravating or relieving factors.   She also has copy of recent labs from work  No other complaint.   Past Medical History:  Diagnosis Date  . Adenomatous colon polyp   . Allergy   . Anemia   . Anxiety   . Asthma   . Atherosclerosis of aorta ([place]) 03/2014   abnormal on ultrasound, repeat US within 5 years  . Depression   . Diverticulosis   . Eye injury    in college, some pertinent loss of vision in left eye  . Genital herpes   . GERD (gastroesophageal reflux disease)   . History of bone density study 09/2011   normal  . History of mammogram    Dr. Radene Knee  . History of melanoma    sees [facility], dermatology  . History of PFTs 01/20/2011   moderate airflow limitation, no significant response to bronchodilator  . Hypertension 12/2011  . Insomnia   . Migraines    Dr. Melton Alar, migraines  . Nearsightedness    wears glasses  . Routine gynecological examination    Dr. Radene Knee  . Zika virus disease 06/2014   Current Outpatient Prescriptions on File Prior to Visit  Medication Sig Dispense Refill  . acetaminophen (TYLENOL) 500 MG tablet 1-2 tablets twice to three times daily as needed. 100 tablet 5  . albuterol (PROVENTIL HFA;VENTOLIN HFA) 108 (90 BASE) MCG/ACT inhaler Inhale 2 puffs into the lungs as needed. 1 Inhaler 0  . alosetron (LOTRONEX) 0.5 MG tablet TAKE ONE TABLET TWICE DAILY 30 tablet 1  . atenolol (TENORMIN) 25 MG tablet TAKE ONE TABLET EACH DAY 90 tablet 3  . cetirizine (ZYRTEC) 10 MG tablet Take 1 tablet (10 mg total) by mouth daily. 90 tablet 3  . hydrochlorothiazide (MICROZIDE) 12.5 MG capsule TAKE ONE CAPSULE EACH DAY 90 capsule 3  .  Multiple Vitamin (MULTIVITAMIN) capsule Take 1 capsule by mouth daily.    . ondansetron (ZOFRAN) 4 MG tablet TAKE ONE TABLET EVERY EIGHT HOURS AS NEEDED FOR NAUSEA AND VOMITING (AND TO PREVENT DIARRHEA-MAY USE 2 TABLETS ) 60 tablet 6  . pravastatin (PRAVACHOL) 20 MG tablet Take 1 tablet (20 mg total) by mouth at bedtime. 90 tablet 0  . umeclidinium bromide (INCRUSE ELLIPTA) 62.5 MCG/INH AEPB Inhale 1 puff into the lungs daily. 1 each 0  . zolpidem (AMBIEN) 10 MG tablet TAKE ONE TABLET AT BEDTIME AS NEEDED FOR SLEEP 30 tablet 1   Current Facility-Administered Medications on File Prior to Visit  Medication Dose Route Frequency Provider Last Rate Last Dose  . albuterol (PROVENTIL) (5 MG/ML) 0.5% nebulizer solution 2.5 mg  2.5 mg Nebulization Once Rita Ohara, MD       ROS as in subjective   Objective: BP 120/80   Pulse (!) 50   Temp 97.4 F (36.3 C) (Oral)   Wt 153 lb 3.2 oz (69.5 kg)   SpO2 98%   BMI 24.73 kg/m   Gen: wd, wn, nad Skin: upper chest with pink/red coloration, some flaky skin, some raised irritated whealed lesions   Assessment: Encounter Diagnoses  Name Primary?  Marland Kitchen  Sun exposure, moderate, initial encounter Yes  . Sunburn   . Essential hypertension   . Dyslipidemia      Plan: Advised OTC oral benadryl 3-4 times daily the next few days, medications below or topical aloe+lidocaine such as solar caine.  F/u if not much improved in the next week  Reviewed the lab report.  C/t same medications  Shelby Randolph was seen today for rash on chest..  Diagnoses and all orders for this visit:  Sun exposure, moderate, initial encounter  Sunburn  Essential hypertension  Dyslipidemia  Other orders -     triamcinolone cream (KENALOG) 0.1 %; Apply 1 application topically 2 (two) times daily. -     predniSONE (DELTASONE) 20 MG tablet; Take 2 tablets (40 mg total) by mouth daily with breakfast. 2 tablets daily for 3 days

## 2016-11-08 ENCOUNTER — Other Ambulatory Visit: Payer: Self-pay | Admitting: Medical

## 2016-12-08 DIAGNOSIS — H524 Presbyopia: Secondary | ICD-10-CM | POA: Diagnosis not present

## 2016-12-08 DIAGNOSIS — H5203 Hypermetropia, bilateral: Secondary | ICD-10-CM | POA: Diagnosis not present

## 2016-12-26 ENCOUNTER — Other Ambulatory Visit: Payer: Self-pay | Admitting: Medical

## 2016-12-27 ENCOUNTER — Other Ambulatory Visit: Payer: Self-pay | Admitting: Medical

## 2016-12-28 NOTE — Telephone Encounter (Signed)
Can pt have a refill on this 

## 2016-12-28 NOTE — Telephone Encounter (Signed)
ok 

## 2016-12-31 ENCOUNTER — Encounter: Payer: Self-pay | Admitting: Family Medicine

## 2016-12-31 ENCOUNTER — Ambulatory Visit (INDEPENDENT_AMBULATORY_CARE_PROVIDER_SITE_OTHER): Payer: PPO | Admitting: Family Medicine

## 2016-12-31 VITALS — BP 120/70 | HR 60 | Temp 97.3°F | Ht 68.0 in | Wt 157.8 lb

## 2016-12-31 DIAGNOSIS — L03031 Cellulitis of right toe: Secondary | ICD-10-CM | POA: Diagnosis not present

## 2016-12-31 MED ORDER — CEPHALEXIN 500 MG PO CAPS: 500.0000 mg | ORAL_CAPSULE | Freq: Three times a day (TID) | ORAL | 0 refills | Status: DC

## 2016-12-31 NOTE — Progress Notes (Signed)
Chief Complaint  Patient presents with  . Toe infection    since Sunday on her right foot. Also having leg cramps at night.     Pain in the right 3rd toe, next to the nail for about a week. She has been soaking it, tried to poke it last night.  Nothing drained (doesn't think she got deep enough), but it feels a little better today.  Sunday the whole foot hurt, had a headache.  Seems to be localizing now to that particular toe.  Felt feverish yesterday. Denies nausea, vomiting, diarrhea. She has also been complaining some of leg cramps recently.  PMH, PSH, SH reviewed  Current Outpatient Prescriptions on File Prior to Visit  Medication Sig Dispense Refill  . acetaminophen (TYLENOL) 500 MG tablet 1-2 tablets twice to three times daily as needed. 100 tablet 5  . alosetron (LOTRONEX) 0.5 MG tablet TAKE ONE TABLET TWICE DAILY 30 tablet 1  . atenolol (TENORMIN) 25 MG tablet TAKE ONE TABLET EACH DAY 90 tablet 3  . cetirizine (ZYRTEC) 10 MG tablet Take 1 tablet (10 mg total) by mouth daily. 90 tablet 3  . hydrochlorothiazide (MICROZIDE) 12.5 MG capsule TAKE ONE CAPSULE EACH DAY 90 capsule 3  . metoCLOPramide (REGLAN) 5 MG tablet TAKE ONE TABLET TWICE DAILY 60 tablet 0  . Multiple Vitamin (MULTIVITAMIN) capsule Take 1 capsule by mouth daily.    . pravastatin (PRAVACHOL) 20 MG tablet Take 1 tablet (20 mg total) by mouth at bedtime. 90 tablet 0  . zolpidem (AMBIEN) 10 MG tablet TAKE ONE TABLET AT BEDTIME AS NEEDED FOR SLEEP 30 tablet 0  . albuterol (PROVENTIL HFA;VENTOLIN HFA) 108 (90 BASE) MCG/ACT inhaler Inhale 2 puffs into the lungs as needed. (Patient not taking: Reported on 12/31/2016) 1 Inhaler 0  . ondansetron (ZOFRAN) 4 MG tablet TAKE ONE TABLET EVERY EIGHT HOURS AS NEEDED FOR NAUSEA AND VOMITING (AND TO PREVENT DIARRHEA-MAY USE 2 TABLETS ) (Patient not taking: Reported on 12/31/2016) 60 tablet 6  . triamcinolone cream (KENALOG) 0.1 % Apply 1 application topically 2 (two) times daily. (Patient  not taking: Reported on 12/31/2016) 45 g 0  . umeclidinium bromide (INCRUSE ELLIPTA) 62.5 MCG/INH AEPB Inhale 1 puff into the lungs daily. (Patient not taking: Reported on 12/31/2016) 1 each 0   Current Facility-Administered Medications on File Prior to Visit  Medication Dose Route Frequency Provider Last Rate Last Dose  . albuterol (PROVENTIL) (5 MG/ML) 0.5% nebulizer solution 2.5 mg  2.5 mg Nebulization Once Rita Ohara, MD       Allergies  Allergen Reactions  . Sulfonamide Derivatives Shortness Of Breath and Swelling  . Mucinex [Guaifenesin Er] Other (See Comments)    "Jittery and hypes me up"   ROS:  No nausea, vomiting, diarrhea. +subjective mild fever, chills.  No rashes, just redness at toe.  No chest pain, shortness of breath or any other concerns except as mentioned in the HPI. Leg cramps are not nightly, she thinks possibly related to not drinking enough water.  PHYSICAL EXAM:  BP 120/70 (BP Location: Left Arm, Patient Position: Sitting, Cuff Size: Normal)   Pulse 60   Temp (!) 97.3 F (36.3 C) (Tympanic)   Ht 5\' 8"  (1.727 m)   Wt 157 lb 12.8 oz (71.6 kg)   BMI 23.99 kg/m   Well appearing, pleasant female in no distress Right third toe, lateral aspect of the nail there is erythema spreading somewhat proximally. The lateral aspect of the skin next to the nail has some  mild soft tissue swelling, along with the redness.  It appears white, but is soft, not taut, and only mildly tender.  Suspect she had some drainage yesterday after soaking and poking, has seen improvement  ASSESSMENT/PLAN:  Paronychia of toe of right foot - Plan: cephALEXin (KEFLEX) 500 MG capsule  Paronychia (mild, partially treated), with mild cellulitis. Treat with Keflex and continuied soaks.  Return for I&D if increasing in swelling and pain, and not draining despite the soaks.  Discussed Ddx of leg cramps and treatments (adequate hydration, potassium intake, white bar of soap under sheets per [facility] as an option).   Take the keflex three times daily. Continue regular soaks, at least 3 times/day. Return for lancing if there is increase in swelling and pain.

## 2016-12-31 NOTE — Patient Instructions (Addendum)
Take the keflex three times daily. Continue regular soaks, at least 3 times/day. Return for lancing if there is increase in swelling and pain.  Paronychia Paronychia is an infection of the skin that surrounds a nail. It usually affects the skin around a fingernail, but it may also occur near a toenail. It often causes pain and swelling around the nail. This condition may come on suddenly or develop over a longer period. In some cases, a collection of pus (abscess) can form near or under the nail. Usually, paronychia is not serious and it clears up with treatment. What are the causes? This condition may be caused by bacteria or fungi. It is commonly caused by either Streptococcus or Staphylococcus bacteria. The bacteria or fungi often cause the infection by getting into the affected area through an opening in the skin, such as a cut or a hangnail. What increases the risk? This condition is more likely to develop in:  People who get their hands wet often, such as those who work as Designer, industrial/product, bartenders, or nurses.  People who bite their fingernails or suck their thumbs.  People who trim their nails too short.  People who have hangnails or injured fingertips.  People who get manicures.  People who have diabetes.  What are the signs or symptoms? Symptoms of this condition include:  Redness and swelling of the skin near the nail.  Tenderness around the nail when you touch the area.  Pus-filled bumps under the cuticle. The cuticle is the skin at the base or sides of the nail.  Fluid or pus under the nail.  Throbbing pain in the area.  How is this diagnosed? This condition is usually diagnosed with a physical exam. In some cases, a sample of pus may be taken from an abscess to be tested in a lab. This can help to determine what type of bacteria or fungi is causing the condition. How is this treated? Treatment for this condition depends on the cause and severity of the condition. If  the condition is mild, it may clear up on its own in a few days. Your health care provider may recommend soaking the affected area in warm water a few times a day. When treatment is needed, the options may include:  Antibiotic medicine, if the condition is caused by a bacterial infection.  Antifungal medicine, if the condition is caused by a fungal infection.  Incision and drainage, if an abscess is present. In this procedure, the health care provider will cut open the abscess so the pus can drain out.  Follow these instructions at home:  Soak the affected area in warm water if directed to do so by your health care provider. You may be told to do this for 20 minutes, 2-3 times a day. Keep the area dry in between soakings.  Take medicines only as directed by your health care provider.  If you were prescribed an antibiotic medicine, finish all of it even if you start to feel better.  Keep the affected area clean.  Do not try to drain a fluid-filled bump yourself.  If you will be washing dishes or performing other tasks that require your hands to get wet, wear rubber gloves. You should also wear gloves if your hands might come in contact with irritating substances, such as cleaners or chemicals.  Follow your health care provider's instructions about: ? Wound care. ? Bandage (dressing) changes and removal. Contact a health care provider if:  Your symptoms get worse or  do not improve with treatment.  You have a fever or chills.  You have redness spreading from the affected area.  You have continued or increased fluid, blood, or pus coming from the affected area.  Your finger or knuckle becomes swollen or is difficult to move. This information is not intended to replace advice given to you by your health care provider. Make sure you discuss any questions you have with your health care provider. Document Released: 10/21/2000 Document Revised: 10/03/2015 Document Reviewed:  04/04/2014 Elsevier Interactive Patient Education  [employer].

## 2017-01-24 ENCOUNTER — Other Ambulatory Visit: Payer: Self-pay | Admitting: Family Medicine

## 2017-01-25 NOTE — Telephone Encounter (Signed)
The record shows this was already called in so verify this

## 2017-01-25 NOTE — Telephone Encounter (Signed)
[facility]AST CALLED IN 12/2016- [place]

## 2017-01-26 DIAGNOSIS — Z23 Encounter for immunization: Secondary | ICD-10-CM | POA: Diagnosis not present

## 2017-01-26 NOTE — Telephone Encounter (Signed)
Call out with a refil

## 2017-02-02 ENCOUNTER — Other Ambulatory Visit: Payer: Self-pay | Admitting: Medical

## 2017-02-02 NOTE — Telephone Encounter (Signed)
Can pt have a refill on this she has had this in awhile

## 2017-02-06 ENCOUNTER — Other Ambulatory Visit: Payer: Self-pay | Admitting: Family Medicine

## 2017-02-09 NOTE — Telephone Encounter (Signed)
Call and see if she feels this still helps

## 2017-03-20 ENCOUNTER — Other Ambulatory Visit: Payer: Self-pay | Admitting: Medical

## 2017-03-22 NOTE — Telephone Encounter (Signed)
Ok to renew?  

## 2017-03-25 ENCOUNTER — Telehealth: Payer: Self-pay | Admitting: Medical

## 2017-03-25 NOTE — Telephone Encounter (Signed)
Recv'd fax from [facility] pt request refill Zolpidem 10mg  #30

## 2017-03-26 MED ORDER — ZOLPIDEM TARTRATE 10 MG PO TABS: ORAL_TABLET | ORAL | 2 refills | Status: DC

## 2017-03-26 NOTE — Telephone Encounter (Signed)
Call out with 2 refils

## 2017-03-26 NOTE — Telephone Encounter (Signed)
Called in per [facility]

## 2017-04-02 ENCOUNTER — Encounter (HOSPITAL_COMMUNITY): Payer: Self-pay | Admitting: Emergency Medicine

## 2017-04-02 ENCOUNTER — Emergency Department (HOSPITAL_COMMUNITY): Payer: PPO

## 2017-04-02 ENCOUNTER — Other Ambulatory Visit: Payer: Self-pay

## 2017-04-02 ENCOUNTER — Emergency Department (HOSPITAL_COMMUNITY)
Admission: EM | Admit: 2017-04-02 | Discharge: 2017-04-02 | Disposition: A | Discharge status: Home / Self Care | Payer: PPO | Attending: Emergency Medicine | Admitting: Emergency Medicine

## 2017-04-02 DIAGNOSIS — Z79899 Other long term (current) drug therapy: Secondary | ICD-10-CM | POA: Diagnosis not present

## 2017-04-02 DIAGNOSIS — I10 Essential (primary) hypertension: Secondary | ICD-10-CM | POA: Insufficient documentation

## 2017-04-02 DIAGNOSIS — R109 Unspecified abdominal pain: Secondary | ICD-10-CM | POA: Diagnosis not present

## 2017-04-02 DIAGNOSIS — F329 Major depressive disorder, single episode, unspecified: Secondary | ICD-10-CM | POA: Insufficient documentation

## 2017-04-02 DIAGNOSIS — R1030 Lower abdominal pain, unspecified: Secondary | ICD-10-CM | POA: Insufficient documentation

## 2017-04-02 DIAGNOSIS — R197 Diarrhea, unspecified: Secondary | ICD-10-CM

## 2017-04-02 DIAGNOSIS — F419 Anxiety disorder, unspecified: Secondary | ICD-10-CM | POA: Insufficient documentation

## 2017-04-02 LAB — COMPREHENSIVE METABOLIC PANEL
ALK PHOS: 61 U/L (ref 38–126)
ALT: 20 U/L (ref 14–54)
ANION GAP: 12 (ref 5–15)
AST: 31 U/L (ref 15–41)
Albumin: 3.9 g/dL (ref 3.5–5.0)
BILIRUBIN TOTAL: 1.2 mg/dL (ref 0.3–1.2)
BUN: 27 mg/dL — ABNORMAL HIGH (ref 6–20)
CALCIUM: 9.2 mg/dL (ref 8.9–10.3)
CO2: 24 mmol/L (ref 22–32)
CREATININE: 0.98 mg/dL (ref 0.44–1.00)
Chloride: 102 mmol/L (ref 101–111)
GFR, EST NON AFRICAN AMERICAN: 58 mL/min — AB (ref >60)
Glucose, Bld: 132 mg/dL — ABNORMAL HIGH (ref 65–99)
Potassium: 2.8 mmol/L — ABNORMAL LOW (ref 3.5–5.1)
Sodium: 138 mmol/L (ref 135–145)
TOTAL PROTEIN: 6.8 g/dL (ref 6.5–8.1)

## 2017-04-02 LAB — CBC
HCT: 37.7 % (ref 36.0–46.0)
HEMOGLOBIN: 12.9 g/dL (ref 12.0–15.0)
MCH: 30.6 pg (ref 26.0–34.0)
MCHC: 34.2 g/dL (ref 30.0–36.0)
MCV: 89.3 fL (ref 78.0–100.0)
PLATELETS: 143 10*3/uL — AB (ref 150–400)
RBC: 4.22 MIL/uL (ref 3.87–5.11)
RDW: 13 % (ref 11.5–15.5)
WBC: 5.6 10*3/uL (ref 4.0–10.5)

## 2017-04-02 LAB — URINALYSIS, ROUTINE W REFLEX MICROSCOPIC
BILIRUBIN URINE: NEGATIVE
Bacteria, UA: NONE SEEN
GLUCOSE, UA: NEGATIVE mg/dL
Ketones, ur: 20 mg/dL — AB
Leukocytes, UA: NEGATIVE
NITRITE: NEGATIVE
Protein, ur: NEGATIVE mg/dL
pH: 5 (ref 5.0–8.0)

## 2017-04-02 LAB — LIPASE, BLOOD: Lipase: 32 U/L (ref 11–51)

## 2017-04-02 MED ORDER — ONDANSETRON HCL 4 MG PO TABS: ORAL_TABLET | ORAL | 0 refills | Status: DC

## 2017-04-02 MED ORDER — PREDNISONE 20 MG PO TABS: 40.0000 mg | ORAL_TABLET | Freq: Every day | ORAL | 0 refills | Status: DC

## 2017-04-02 MED ORDER — MORPHINE SULFATE (PF) 4 MG/ML IV SOLN
2.0000 mg | Freq: Once | INTRAVENOUS | Status: AC
  Administered 2017-04-02: 2 mg via INTRAVENOUS
  Filled 2017-04-02: qty 1

## 2017-04-02 MED ORDER — IOPAMIDOL (ISOVUE-370) INJECTION 76%
INTRAVENOUS | Status: AC
  Filled 2017-04-02: qty 100

## 2017-04-02 MED ORDER — IOPAMIDOL (ISOVUE-300) INJECTION 61%
INTRAVENOUS | Status: AC
  Administered 2017-04-02: 100 mL
  Filled 2017-04-02: qty 100

## 2017-04-02 MED ORDER — SODIUM CHLORIDE 0.9 % IV BOLUS (SEPSIS)
1000.0000 mL | Freq: Once | INTRAVENOUS | Status: AC
  Administered 2017-04-02: 1000 mL via INTRAVENOUS

## 2017-04-02 MED ORDER — PREDNISONE 20 MG PO TABS
60.0000 mg | ORAL_TABLET | ORAL | Status: AC
  Administered 2017-04-02: 60 mg via ORAL
  Filled 2017-04-02: qty 3

## 2017-04-02 MED ORDER — ONDANSETRON HCL 4 MG/2ML IJ SOLN
4.0000 mg | Freq: Once | INTRAMUSCULAR | Status: AC
  Administered 2017-04-02: 4 mg via INTRAVENOUS
  Filled 2017-04-02: qty 2

## 2017-04-02 MED ORDER — METOCLOPRAMIDE HCL 5 MG PO TABS: 5.0000 mg | ORAL_TABLET | Freq: Two times a day (BID) | ORAL | 0 refills | Status: DC

## 2017-04-02 NOTE — ED Notes (Signed)
Pt states she understands INstructions. Home stable with steady gait.

## 2017-04-02 NOTE — ED Triage Notes (Signed)
Pt. Stated, I have IBS and Ive had diarrhea since Tuesday. Im having abdominal cramping

## 2017-04-02 NOTE — ED Notes (Signed)
Pt states she did not provide urine earlier. Same requested MD aware.

## 2017-04-02 NOTE — ED Provider Notes (Signed)
[facility] DEPARTMENT Provider Note   CSN: [id number] Arrival date & time: 04/02/17  4098     History   Chief Complaint Chief Complaint  Patient presents with  . Irritable Bowel Syndrome  . Diarrhea    HPI Shelby Randolph is a 66 y.o. female.  HPI  Patient with a history of irritable bowel syndrome presents with concern of diarrhea, abdominal pain, nausea, anorexia. Onset was 3 days ago. Since onset symptoms have been persistent, with no relief in spite of using OTC medication including Imodium. Pain is diffuse, lower, sore, crampy. No fever, no chest pain, no other changes from baseline. She notes that this is different, more severe pain that she typically experiences with IBS episodes.   Past Medical History:  Diagnosis Date  . Adenomatous colon polyp   . Allergy   . Anemia   . Anxiety   . Asthma   . Atherosclerosis of aorta ([place]) 03/2014   abnormal on ultrasound, repeat US within 5 years  . Depression   . Diverticulosis   . Eye injury    in college, some pertinent loss of vision in left eye  . Genital herpes   . GERD (gastroesophageal reflux disease)   . History of bone density study 09/2011   normal  . History of mammogram    Dr. Radene Knee  . History of melanoma    sees [facility], dermatology  . History of PFTs 01/20/2011   moderate airflow limitation, no significant response to bronchodilator  . Hypertension 12/2011  . Insomnia   . Migraines    Dr. Melton Alar, migraines  . Nearsightedness    wears glasses  . Routine gynecological examination    Dr. Radene Knee  . 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 Routine general medical examination at a health care facility 06/25/2016  . Left hip pain 06/25/2016  . Encounter for health maintenance examination in adult 05/23/2015  . Generalized anxiety disorder 05/23/2015  . Vitamin D deficiency 05/23/2015  . IBS (irritable bowel syndrome)  06/11/2014  . Gallstones 04/12/2014  . Dyslipidemia 04/12/2014  . Essential hypertension 04/12/2014  . Asthma, mild intermittent 04/12/2014  . Insomnia 04/12/2014  . Genital herpes 04/12/2014  . Chronic nausea 04/12/2014  . Right upper quadrant pain 04/12/2014  . History of colonic polyps 04/12/2014  . Chronic diarrhea 04/12/2014  . Rhinitis, allergic 04/12/2014  . Atherosclerosis of abdominal aorta ([place]) 04/12/2014    Past Surgical History:  Procedure Laterality Date  . COLONOSCOPY  2016   Dr. Silvano Rusk, 2014 Dr. Earlean Shawl  . ESOPHAGOGASTRODUODENOSCOPY    . ROTATOR CUFF REPAIR Right 05/2010  . TONSILLECTOMY AND ADENOIDECTOMY    . VAGINAL HYSTERECTOMY  1998   total; abnormal peroids  . VARICOSE VEIN SURGERY     bilat    OB History    No data available       Home Medications    Prior to Admission medications   Medication Sig Start Date End Date Taking? Authorizing Provider  acetaminophen (TYLENOL) 500 MG tablet 1-2 tablets twice to three times daily as needed. 02/03/12  Yes Tysinger, Camelia Eng, PA-C  acyclovir (ZOVIRAX) 400 MG tablet TAKE 1 TABLET TWICE DAILY FOR SUPPRESSION OR 3 TIMES DAILY FOR 5 DAYSFOR FLARE UP 02/02/17  Yes Tysinger, Camelia Eng, PA-C  albuterol (PROVENTIL HFA;VENTOLIN HFA) 108 (90 BASE) MCG/ACT inhaler Inhale 2 puffs into the lungs as needed. 04/16/15  Yes Tysinger,  Camelia Eng, PA-C  alosetron (LOTRONEX) 0.5 MG tablet TAKE ONE TABLET TWICE DAILY 08/26/16  Yes Gatha Mayer, MD  atenolol (TENORMIN) 25 MG tablet TAKE ONE TABLET EACH DAY 06/25/16  Yes Tysinger, Camelia Eng, PA-C  cetirizine (ZYRTEC) 10 MG tablet Take 1 tablet (10 mg total) by mouth daily. 04/12/14  Yes Tysinger, Camelia Eng, PA-C  hydrochlorothiazide (MICROZIDE) 12.5 MG capsule TAKE ONE CAPSULE EACH DAY 06/25/16  Yes Tysinger, Camelia Eng, PA-C  metoCLOPramide (REGLAN) 5 MG tablet TAKE ONE TABLET TWICE DAILY 03/22/17  Yes Tysinger, Camelia Eng, PA-C  Multiple Vitamin (MULTIVITAMIN) capsule Take 1 capsule by mouth  daily.   Yes [provider]  ondansetron (ZOFRAN) 4 MG tablet TAKE ONE TABLET EVERY EIGHT HOURS AS NEEDED FOR NAUSEA AND VOMITING (AND TO PREVENT DIARRHEA-MAY USE 2 TABLETS ) 10/01/16  Yes Gatha Mayer, MD  pravastatin (PRAVACHOL) 20 MG tablet Take 1 tablet (20 mg total) by mouth at bedtime. 06/29/16  Yes Tysinger, Camelia Eng, PA-C  Probiotic Product (PRO-BIOTIC BLEND PO) Take 1 tablet by mouth daily.   Yes [provider]  triamcinolone cream (KENALOG) 0.1 % Apply 1 application topically 2 (two) times daily. 11/06/16  Yes Tysinger, Camelia Eng, PA-C  umeclidinium bromide (INCRUSE ELLIPTA) 62.5 MCG/INH AEPB Inhale 1 puff into the lungs daily. 08/20/16  Yes Tysinger, Camelia Eng, PA-C  zolpidem (AMBIEN) 10 MG tablet TAKE ONE TABLET AT BEDTIME AS NEEDED FOR SLEEP 03/26/17  Yes Tysinger, Camelia Eng, PA-C  cephALEXin (KEFLEX) 500 MG capsule Take 1 capsule (500 mg total) by mouth 3 (three) times daily. Patient not taking: Reported on 04/02/2017 12/31/16   Rita Ohara, MD    Family History Family History  Problem Relation Age of Onset  . COPD Mother        died age 58yo.  emphysema  . Colon cancer Father 3        died age 85yo  . Colon cancer Brother 13  . Breast cancer Maternal Aunt   . Colon cancer Paternal Uncle        2  . Breast cancer Daughter   . Diabetes Neg Hx   . Heart disease Neg Hx   . Hypertension Neg Hx   . Stroke Neg Hx     Social History Social History   Tobacco Use  . Smoking status: Never Smoker  . Smokeless tobacco: Never Used  Substance Use Topics  . Alcohol use: Yes    Alcohol/week: 1.2 oz    Types: 2 Glasses of wine per week  . Drug use: No     Allergies   Sulfonamide derivatives and Mucinex [guaifenesin er]   Review of Systems Review of Systems  Constitutional:       Per HPI, otherwise negative  HENT:       Per HPI, otherwise negative  Respiratory:       Per HPI, otherwise negative  Cardiovascular:       Per HPI, otherwise negative    Gastrointestinal: Positive for abdominal pain, diarrhea and nausea. Negative for blood in stool and vomiting.  Endocrine:       Negative aside from HPI  Genitourinary:       Neg aside from HPI   Musculoskeletal:       Per HPI, otherwise negative  Skin: Negative.   Neurological: Negative for syncope.     Physical Exam Updated Vital Signs BP 121/64   Pulse 77   Temp 97.9 F (36.6 C) (Oral)   Resp 16  Ht 5\' 8"  (1.727 m)   Wt 68 kg (150 lb)   SpO2 96%   BMI 22.81 kg/m   Physical Exam  Constitutional: She is oriented to person, place, and time. She appears well-developed and well-nourished. No distress.  HENT:  Head: Normocephalic and atraumatic.  Eyes: Conjunctivae and EOM are normal.  Cardiovascular: Normal rate and regular rhythm.  Pulmonary/Chest: Effort normal and breath sounds normal. No stridor. No respiratory distress.  Abdominal: She exhibits no distension.  Tenderness throughout the lower abdomen, no distention  Musculoskeletal: She exhibits no edema.  Neurological: She is alert and oriented to person, place, and time. No cranial nerve deficit.  Skin: Skin is warm and dry.  Psychiatric: She has a normal mood and affect.  Nursing note and vitals reviewed.    ED Treatments / Results  Labs (all labs ordered are listed, but only abnormal results are displayed) Labs Reviewed  COMPREHENSIVE METABOLIC PANEL - Abnormal; Notable for the following components:      Result Value   Potassium 2.8 (*)    Glucose, Bld 132 (*)    BUN 27 (*)    GFR calc non Af Amer 58 (*)    All other components within normal limits  CBC - Abnormal; Notable for the following components:   Platelets 143 (*)    All other components within normal limits  URINALYSIS, ROUTINE W REFLEX MICROSCOPIC - Abnormal; Notable for the following components:   Specific Gravity, Urine >1.046 (*)    Hgb urine dipstick SMALL (*)    Ketones, ur 20 (*)    Squamous Epithelial / LPF 0-5 (*)    All other  components within normal limits  LIPASE, BLOOD   Radiology Ct Abdomen Pelvis W Contrast  Result Date: 04/02/2017 CLINICAL DATA:  Pt c/o abdominal pain "around her stomach" with diarrhea x 4 days. Hx of IBS. R/o diverticulitis. EXAM: CT ABDOMEN AND PELVIS WITH CONTRAST TECHNIQUE: Multidetector CT imaging of the abdomen and pelvis was performed using the standard protocol following bolus administration of intravenous contrast. CONTRAST:  ISOVUE-300 IOPAMIDOL (ISOVUE-300) INJECTION 61% COMPARISON:  04/07/2005 FINDINGS: Lower chest: No acute abnormality. Hepatobiliary: Scattered small hepatic cysts as before. No new liver lesion. Several partially calcified stones in the dependent aspect of the nondilated gallbladder. No biliary ductal dilatation. Pancreas: Unremarkable. No pancreatic ductal dilatation or surrounding inflammatory changes. Spleen: Normal in size without focal abnormality. Adrenals/Urinary Tract: Adrenal glands are unremarkable. Kidneys are normal, without definite renal calculi, focal lesion, or hydronephrosis. Bladder is unremarkable. Stomach/Bowel: Small bowel and stomach are nondistended. Normal appendix. The colon is incompletely distended. Scattered diverticula from the descending and sigmoid portions. No evidence of perforation or abscess. Vascular/Lymphatic: Scattered aortoiliac arterial calcifications without aneurysm or stenosis. No abdominal or pelvic adenopathy localized. Duplicated infrarenal IVC, an anatomic variant. Reproductive: Status post hysterectomy. No adnexal masses. Other: No ascites.  No free air. Musculoskeletal: Degenerative disc disease L5-S1. Facet DJD L4-5 probably accounts for the early grade 1 anterolisthesis. Negative for fracture or worrisome bone lesion. IMPRESSION: 1. No acute findings. 2. Descending and sigmoid diverticulosis 3. Cholelithiasis 4. Lumbar spondylitic changes 5.  Aortic Atherosclerosis (ICD10-170.0) Electronically Signed   By: Lucrezia Europe M.D.   On:  04/02/2017 12:10    Procedures Procedures (including critical care time)  Medications Ordered in ED Medications  predniSONE (DELTASONE) tablet 60 mg (not administered)  sodium chloride 0.9 % bolus 1,000 mL (0 mLs Intravenous Stopped 04/02/17 1408)  morphine 4 MG/ML injection 2 mg (2 mg Intravenous  Given 04/02/17 1100)  ondansetron (ZOFRAN) injection 4 mg (4 mg Intravenous Given 04/02/17 1100)  iopamidol (ISOVUE-300) 61 % injection (100 mLs  Contrast Given 04/02/17 1137)     Initial Impression / Assessment and Plan / ED Course  I have reviewed the triage vital signs and the nursing notes.  Pertinent labs & imaging results that were available during my care of the patient were reviewed by me and considered in my medical decision making (see chart for details).  3:45 PM Patient awake and alert, in no distress, watching television. Discussed all findings at length including reassuring CT scan, urinalysis, labs. With reassuring imaging, no evidence for peritonitis, bacteremia, sepsis, there is suspicion for IBD flare. Patient will be started on a course of steroids, antiemetics, follow-up with her gastroenterologist. In addition, the patient and I discussed additional options for evaluation, and she will pursue these as well.  Final Clinical Impressions(s) / ED Diagnoses   Final diagnoses:  Diarrhea, unspecified type  Lower abdominal pain    ED Discharge Orders        Ordered    metoCLOPramide (REGLAN) 5 MG tablet  2 times daily     04/02/17 1531    ondansetron (ZOFRAN) 4 MG tablet     04/02/17 1531    predniSONE (DELTASONE) 20 MG tablet  Daily with breakfast     04/02/17 1531       Carmin Muskrat, MD 04/02/17 1546

## 2017-04-02 NOTE — Discharge Instructions (Signed)
As discussed, your evaluation today has been largely reassuring.  But, it is important that you monitor your condition carefully, and do not hesitate to return to the ED if you develop new, or concerning changes in your condition.  Your symptoms are likely due at least in part to your inflammatory bowel disease. Please take all medication as directed and be sure to follow-up with both your gastroenterologist, and consider following up with one of the local IBD clinics at our nearby academic medical centers.

## 2017-04-05 ENCOUNTER — Telehealth: Payer: Self-pay | Admitting: Internal Medicine

## 2017-04-05 NOTE — Telephone Encounter (Signed)
Patient will come in tomorrow to discuss IBS/gastritis flare and post ED visit

## 2017-04-06 ENCOUNTER — Ambulatory Visit: Payer: PPO | Admitting: Physician Assistant

## 2017-04-06 ENCOUNTER — Encounter: Payer: Self-pay | Admitting: Physician Assistant

## 2017-04-06 VITALS — BP 98/60 | HR 60 | Ht 66.25 in | Wt 157.5 lb

## 2017-04-06 DIAGNOSIS — K580 Irritable bowel syndrome with diarrhea: Secondary | ICD-10-CM | POA: Diagnosis not present

## 2017-04-06 MED ORDER — ALOSETRON HCL 0.5 MG PO TABS: 1.0000 mg | ORAL_TABLET | Freq: Two times a day (BID) | ORAL | 2 refills | Status: DC

## 2017-04-06 NOTE — Progress Notes (Addendum)
Chief Complaint: IBS-D  HPI:    Shelby Randolph is a 66 year old Caucasian female with a past medical history as listed below including irritable bowel syndrome with diarrhea, who follows with Dr. Carlean Purl and presents to clinic today with a complaint of IBS.    Patient was last seen in clinic 06/10/16 by Dr. Carlean Purl.  At that time, she was following up on her intermittent loose/watery and urgent stools that were unpredictable at times.  She did note that foods in the FODMAP family tended to cause problems and she did try to avoid them.  At that time, she was better with Ondansetron as needed but was still having some urgency.  Alosetron 0.5 mg twice daily was prescribed instead.  It was discussed that if she did not feel significantly better she could try 1 mg twice daily.  It was also discussed that Viberzi could be another option.  Patient did not have significant benefit from Xifaxan in the past.    Patient's last colonoscopy 04/18/15 which showed diverticulosis throughout the colon, left greater than right and otherwise normal colon and terminal ileum. Repeat was recommended in 2-3 years, due to a personal history of polyps and family history of colon cancer.      She was recently seen in ER on 04/02/17 for diarrhea, abdominal pain, nausea and anorexia.  This was described as more severe than patient's typical "IBS".  CMP revealed a potassium of 2.8, BUN 27 and otherwise normal, CBC showed platelets of 143.  Patient had a CT abdomen pelvis which showed no acute findings.  Descending and sigmoid diverticulosis.  Cholelithiasis.  Lumbar spondylitic changes and aortic atherosclerosis.  Was discussed the patient may be having a "IBD flare" per the ER and she was started on a course of steroids and antiemetics.    Today, the patient expresses that for the past 10 months she has just been doing "okay".  For the past month she has been feeling an increase in fatigue and has noted some increase in her IBS symptoms.   Patient tells me that last week, "I do not know if it was stress or what", but she felt like a "balloon inflated" in her abdomen and she started with terrible diarrhea.  She tells me that she has never "seen so much sh**" in her life.  This started on Monday and worsened until Wednesday when she developed terrible diarrhea and proceeded to the ER on Thursday after she just "could not move or get around from feeling so exhausted".  Patient does describe being given Prednisone 60 mg in the ER and then 20mg  for 5 days.  She has also been taking Reglan 5 mg twice daily as well as Zofran 4 mg every 8 hours.    Currently, the patient tells me that she has not had diarrhea since yesterday and in fact, had a solid stool this morning.  She does not feel nauseous but continues to feel "fatigued and wore out".  Overall, the patient is just "not quite happy" with the way her bowel movements and IBS are acting.  She has continued on her Alosetronon 0.5 mg twice daily.     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  Patient is stressed as she does not feel she can host holidays this year due to her unpredictable symptoms.  She does ask questions regarding a [facility] referral.    Patient denies fever, chills, blood in her stool, melena, weight loss, vomiting or symptoms  that awaken her at night.   Past Medical History:  Diagnosis Date  . Adenomatous colon polyp   . Allergy   . Anemia   . Anxiety   . Asthma   . Atherosclerosis of aorta ([place]) 03/2014   abnormal on ultrasound, repeat US within 5 years  . Depression   . Diverticulosis   . Eye injury    in college, some pertinent loss of vision in left eye  . Genital herpes   . GERD (gastroesophageal reflux disease)   . History of bone density study 09/2011   normal  . History of mammogram    Dr. Radene Knee  . History of melanoma    sees [facility], dermatology  . History of PFTs 01/20/2011   moderate airflow limitation, no significant response  to bronchodilator  . Hypertension 12/2011  . Insomnia   . Migraines    Dr. Melton Alar, migraines  . Nearsightedness    wears glasses  . Routine gynecological examination    Dr. Radene Knee  . Zika virus disease 06/2014    Past Surgical History:  Procedure Laterality Date  . COLONOSCOPY  2016   Dr. Silvano Rusk, 2014 Dr. Earlean Shawl  . ESOPHAGOGASTRODUODENOSCOPY    . ROTATOR CUFF REPAIR Right 05/2010  . TONSILLECTOMY AND ADENOIDECTOMY    . VAGINAL HYSTERECTOMY  1998   total; abnormal peroids  . VARICOSE VEIN SURGERY     bilat    Current Outpatient Medications  Medication Sig Dispense Refill  . acetaminophen (TYLENOL) 500 MG tablet 1-2 tablets twice to three times daily as needed. 100 tablet 5  . acyclovir (ZOVIRAX) 400 MG tablet TAKE 1 TABLET TWICE DAILY FOR SUPPRESSION OR 3 TIMES DAILY FOR 5 DAYSFOR FLARE UP 90 tablet 3  . albuterol (PROVENTIL HFA;VENTOLIN HFA) 108 (90 BASE) MCG/ACT inhaler Inhale 2 puffs into the lungs as needed. 1 Inhaler 0  . alosetron (LOTRONEX) 0.5 MG tablet TAKE ONE TABLET TWICE DAILY 30 tablet 1  . atenolol (TENORMIN) 25 MG tablet TAKE ONE TABLET EACH DAY 90 tablet 3  . cephALEXin (KEFLEX) 500 MG capsule Take 1 capsule (500 mg total) by mouth 3 (three) times daily. (Patient not taking: Reported on 04/02/2017) 30 capsule 0  . cetirizine (ZYRTEC) 10 MG tablet Take 1 tablet (10 mg total) by mouth daily. 90 tablet 3  . hydrochlorothiazide (MICROZIDE) 12.5 MG capsule TAKE ONE CAPSULE EACH DAY 90 capsule 3  . metoCLOPramide (REGLAN) 5 MG tablet Take 1 tablet (5 mg total) by mouth 2 (two) times daily. 30 tablet 0  . Multiple Vitamin (MULTIVITAMIN) capsule Take 1 capsule by mouth daily.    . ondansetron (ZOFRAN) 4 MG tablet TAKE ONE TABLET EVERY EIGHT HOURS AS NEEDED FOR NAUSEA AND VOMITING (AND TO PREVENT DIARRHEA-MAY USE 2 TABLETS ) 30 tablet 0  . pravastatin (PRAVACHOL) 20 MG tablet Take 1 tablet (20 mg total) by mouth at bedtime. 90 tablet 0  . predniSONE (DELTASONE) 20 MG  tablet Take 2 tablets (40 mg total) by mouth daily with breakfast. For the next four days 8 tablet 0  . Probiotic Product (PRO-BIOTIC BLEND PO) Take 1 tablet by mouth daily.    Marland Kitchen triamcinolone cream (KENALOG) 0.1 % Apply 1 application topically 2 (two) times daily. 45 g 0  . umeclidinium bromide (INCRUSE ELLIPTA) 62.5 MCG/INH AEPB Inhale 1 puff into the lungs daily. 1 each 0  . zolpidem (AMBIEN) 10 MG tablet TAKE ONE TABLET AT BEDTIME AS NEEDED FOR SLEEP 30 tablet 2   Current  Facility-Administered Medications  Medication Dose Route Frequency Provider Last Rate Last Dose  . albuterol (PROVENTIL) (5 MG/ML) 0.5% nebulizer solution 2.5 mg  2.5 mg Nebulization Once Rita Ohara, MD        Allergies as of 04/06/2017 - Review Complete 04/02/2017  Allergen Reaction Noted  . Sulfonamide derivatives Shortness Of Breath and Swelling 08/21/2008  . Mucinex [guaifenesin er] Other (See Comments) 05/09/2012    Family History  Problem Relation Age of Onset  . COPD Mother        died age 77yo.  emphysema  . Colon cancer Father 6        died age 48yo  . Colon cancer Brother 20  . Breast cancer Maternal Aunt   . Colon cancer Paternal Uncle        2  . Breast cancer Daughter   . Diabetes Neg Hx   . Heart disease Neg Hx   . Hypertension Neg Hx   . Stroke Neg Hx     Social History   Socioeconomic History  . Marital status: Widowed    Spouse name: Not on file  . Number of children: 5                .    .    .    .    Occupational History  . Occupation: compliance agent (RN)    Employer: [employer]    Comment: Nurse Practitioner  Tobacco Use  . Smoking status: Never Smoker  . Smokeless tobacco: Never Used  Substance and Sexual Activity  . Alcohol use: Yes    Alcohol/week: 1.2 oz    Types: 2 Glasses of wine per week  . Drug use: No  . Sexual activity: Not on file    Comment: exercise - walks, [place], cardio at gym 3 x/wk, yoga, widowed   Other Topics  Concern  . Not on file  Social History Narrative   Lives alone, has 5 children ([place], [place], [place]), has several grandchildren, daughter Junie Panning in [place] in the [employer], exercises 5 days per week - walking, kickboxing, yoga; [employer];  Works for [employer] in compliance    Review of Systems:    Constitutional: No weight loss, fever or chills Cardiovascular: No chest pain Respiratory: No SOB Gastrointestinal: See HPI and otherwise negative   Physical Exam:  Vital signs: BP 98/60   Pulse 60   Ht 5' 6.25" (1.683 m)   Wt 157 lb 8 oz (71.4 kg)   BMI 25.23 kg/m    Constitutional:   Pleasant Caucasian female appears to be in NAD, Well developed, Well nourished, alert and cooperative Respiratory: Respirations even and unlabored. Lungs clear to auscultation bilaterally.   No wheezes, crackles, or rhonchi.  Cardiovascular: Normal S1, S2. No MRG. Regular rate and rhythm. No peripheral edema, cyanosis or pallor.  Gastrointestinal:  Soft, nondistended, mild generalized ttp. No rebound or guarding. Normal bowel sounds. No appreciable masses or hepatomegaly. Psychiatric: Demonstrates good judgement and reason without abnormal affect or behaviors.  RELEVANT LABS AND IMAGING: CBC    Component Value Date/Time   WBC 5.6 04/02/2017 0849   RBC 4.22 04/02/2017 0849   HGB 12.9 04/02/2017 0849   HGB 12.1 05/23/2014 1335   HCT 37.7 04/02/2017 0849   HCT 36.8 05/23/2014 1335   PLT 143 (L) 04/02/2017 0849   PLT 179 05/23/2014 1335   PLT 177 04/23/2009   MCV 89.3 04/02/2017 0849   MCV  90.4 05/23/2014 1335   MCH 30.6 04/02/2017 0849   MCHC 34.2 04/02/2017 0849   RDW 13.0 04/02/2017 0849   RDW 13.7 05/23/2014 1335   LYMPHSABS 1.3 07/29/2015 0001   LYMPHSABS 1.4 05/23/2014 1335   MONOABS 0.3 07/29/2015 0001   MONOABS 0.4 05/23/2014 1335   EOSABS 0.0 07/29/2015 0001   EOSABS 0.0 05/23/2014 1335   BASOSABS 0.0 07/29/2015 0001   BASOSABS 0.0 05/23/2014 1335    CMP      Component Value Date/Time   NA 138 04/02/2017 0849   NA 142 05/23/2014 1335   K 2.8 (L) 04/02/2017 0849   K 4.1 05/23/2014 1335   CL 102 04/02/2017 0849   CO2 24 04/02/2017 0849   CO2 28 05/23/2014 1335   GLUCOSE 132 (H) 04/02/2017 0849   GLUCOSE 89 05/23/2014 1335   GLUCOSE 90 04/23/2009   BUN 27 (H) 04/02/2017 0849   BUN 20.7 05/23/2014 1335   CREATININE 0.98 04/02/2017 0849   CREATININE 0.82 06/25/2016 0953   CREATININE 0.8 05/23/2014 1335   CALCIUM 9.2 04/02/2017 0849   CALCIUM 9.5 05/23/2014 1335   PROT 6.8 04/02/2017 0849   PROT 7.1 05/23/2014 1335   ALBUMIN 3.9 04/02/2017 0849   ALBUMIN 4.3 05/23/2014 1335   AST 31 04/02/2017 0849   AST 29 05/23/2014 1335   ALT 20 04/02/2017 0849   ALT 16 05/23/2014 1335   ALKPHOS 61 04/02/2017 0849   ALKPHOS 66 05/23/2014 1335   BILITOT 1.2 04/02/2017 0849   BILITOT 0.69 05/23/2014 1335   GFRNONAA 58 (L) 04/02/2017 0849   GFRAA >60 04/02/2017 0849   Ct Abdomen Pelvis W Contrast  Result Date: 04/02/2017 CLINICAL DATA:  Pt c/o abdominal pain "around her stomach" with diarrhea x 4 days. Hx of IBS. R/o diverticulitis. EXAM: CT ABDOMEN AND PELVIS WITH CONTRAST TECHNIQUE: Multidetector CT imaging of the abdomen and pelvis was performed using the standard protocol following bolus administration of intravenous contrast. CONTRAST:  ISOVUE-300 IOPAMIDOL (ISOVUE-300) INJECTION 61% COMPARISON:  04/07/2005 FINDINGS: Lower chest: No acute abnormality. Hepatobiliary: Scattered small hepatic cysts as before. No new liver lesion. Several partially calcified stones in the dependent aspect of the nondilated gallbladder. No biliary ductal dilatation. Pancreas: Unremarkable. No pancreatic ductal dilatation or surrounding inflammatory changes. Spleen: Normal in size without focal abnormality. Adrenals/Urinary Tract: Adrenal glands are unremarkable. Kidneys are normal, without definite renal calculi, focal lesion, or hydronephrosis. Bladder is unremarkable.  Stomach/Bowel: Small bowel and stomach are nondistended. Normal appendix. The colon is incompletely distended. Scattered diverticula from the descending and sigmoid portions. No evidence of perforation or abscess. Vascular/Lymphatic: Scattered aortoiliac arterial calcifications without aneurysm or stenosis. No abdominal or pelvic adenopathy localized. Duplicated infrarenal IVC, an anatomic variant. Reproductive: Status post hysterectomy. No adnexal masses. Other: No ascites.  No free air. Musculoskeletal: Degenerative disc disease L5-S1. Facet DJD L4-5 probably accounts for the early grade 1 anterolisthesis. Negative for fracture or worrisome bone lesion. IMPRESSION: 1. No acute findings. 2. Descending and sigmoid diverticulosis 3. Cholelithiasis 4. Lumbar spondylitic changes 5.  Aortic Atherosclerosis (ICD10-170.0) Electronically Signed   By: Lucrezia Europe M.D.   On: 04/02/2017 12:10     Assessment: 1.  IBS-D: The patient has been tried and failed on Xifaxan in the past, at last visit she was started on Alosetron 0.5 mg twice daily, patient was supposed to follow-up to let us know how she is doing, apparently over the past 10 months she has not felt well and then last week had a "flare  of her IBS".  She was seen in the ER and put on prednisone, patient now feels better, but is still fatigued and unhappy with her IBS in general  Plan: 1.  Discussed case with Dr. Carlean Purl at the time of patient's appointment.  He also saw the patient briefly. 2.  Increased patient's Alosetron to 1 mg twice daily, #60 with 2 refills 3.  The patient was told to follow with our clinic in 1 week to let us know how she is doing on this increased dose of medicine.  If she continues to have complaints and would like a referral to [facility] IBS center, we can do this for her then.  She verbalizes understanding. 4.  Patient may discontinue her Zofran and Reglan at this time. 5.  Patient was set an appointment with Dr. Carlean Purl at his next  available time.  Ellouise Newer, PA-C [facility] 04/06/2017, 3:15 PM  Cc: Carlena Hurl, PA-C    Agree with Ms. Lemmon's evaluation and management. Turns out Shelby Randolph had stopped alosetron and will restart - communication after visit Gatha Mayer, MD, [facility]

## 2017-04-06 NOTE — Patient Instructions (Addendum)
   Increase Alosetron to 1 mg twice a day   Call back or message Dr. Carlean Purl via Deloris Ping in one week with an update of your symptoms.

## 2017-04-07 ENCOUNTER — Encounter: Payer: Self-pay | Admitting: Internal Medicine

## 2017-05-13 ENCOUNTER — Other Ambulatory Visit: Payer: Self-pay | Admitting: Internal Medicine

## 2017-05-24 ENCOUNTER — Other Ambulatory Visit: Payer: Self-pay | Admitting: Internal Medicine

## 2017-05-24 NOTE — Telephone Encounter (Signed)
Patient has upcoming appointment with you on 06/04/17.  Okay to refill, looks like Anderson Malta said to d/c at her last visit?

## 2017-05-24 NOTE — Telephone Encounter (Signed)
Refill x 3 

## 2017-06-03 ENCOUNTER — Encounter: Payer: Self-pay | Admitting: Medical

## 2017-06-03 ENCOUNTER — Ambulatory Visit (INDEPENDENT_AMBULATORY_CARE_PROVIDER_SITE_OTHER): Payer: PPO | Admitting: Medical

## 2017-06-03 ENCOUNTER — Other Ambulatory Visit: Payer: Self-pay | Admitting: Medical

## 2017-06-03 VITALS — BP 120/70 | HR 82 | Temp 97.8°F | Wt 152.8 lb

## 2017-06-03 DIAGNOSIS — R05 Cough: Secondary | ICD-10-CM

## 2017-06-03 DIAGNOSIS — J4520 Mild intermittent asthma, uncomplicated: Secondary | ICD-10-CM

## 2017-06-03 DIAGNOSIS — R059 Cough, unspecified: Secondary | ICD-10-CM

## 2017-06-03 DIAGNOSIS — J40 Bronchitis, not specified as acute or chronic: Secondary | ICD-10-CM | POA: Diagnosis not present

## 2017-06-03 DIAGNOSIS — J329 Chronic sinusitis, unspecified: Secondary | ICD-10-CM | POA: Diagnosis not present

## 2017-06-03 MED ORDER — AZITHROMYCIN 250 MG PO TABS: ORAL_TABLET | ORAL | 0 refills | Status: DC

## 2017-06-03 MED ORDER — PROMETHAZINE-DM 6.25-15 MG/5ML PO SYRP
5.0000 mL | ORAL_SOLUTION | Freq: Four times a day (QID) | ORAL | 0 refills | Status: DC | PRN
Start: 2017-06-03 — End: 2017-07-06

## 2017-06-03 MED ORDER — ALBUTEROL SULFATE HFA 108 (90 BASE) MCG/ACT IN AERS: 2.0000 | INHALATION_SPRAY | RESPIRATORY_TRACT | 1 refills | Status: DC | PRN

## 2017-06-03 NOTE — Progress Notes (Signed)
Subjective:  Shelby Randolph is a 67 y.o. female who presents for illness and cough.  She notes about a week history of cough, congestion, headache, body aches and chills.  She denies nausea vomiting diarrhea sore throat ear pain.  The cough and chest congestion and head congestion has gradually gotten worse and she is worried is can get even worse.  She does have an underlying history of asthma.  No sick contacts.  Non-smoker.  No other aggravating or relieving factors.  No other c/o.  The following portions of the patient's history were reviewed and updated as appropriate: allergies, current medications, past family history, past medical history, past social history, past surgical history and problem list.  ROS as in subjective  Past Medical History:  Diagnosis Date  . Adenomatous colon polyp   . Allergy   . Anemia   . Anxiety   . Asthma   . Atherosclerosis of aorta ([place]) 03/2014   abnormal on ultrasound, repeat US within 5 years  . Depression   . Diverticulosis   . Eye injury    in college, some pertinent loss of vision in left eye  . Genital herpes   . GERD (gastroesophageal reflux disease)   . History of bone density study 09/2011   normal  . History of mammogram    Dr. Radene Knee  . History of melanoma    sees [facility], dermatology  . History of PFTs 01/20/2011   moderate airflow limitation, no significant response to bronchodilator  . Hypertension 12/2011  . Insomnia   . Migraines    Dr. Melton Alar, migraines  . Nearsightedness    wears glasses  . Routine gynecological examination    Dr. Radene Knee  . Zika virus disease 06/2014   Current Outpatient Medications on File Prior to Visit  Medication Sig Dispense Refill  . acetaminophen (TYLENOL) 500 MG tablet 1-2 tablets twice to three times daily as needed. 100 tablet 5  . alosetron (LOTRONEX) 0.5 MG tablet Take 2 tablets (1 mg total) by mouth 2 (two) times daily. 60 tablet 2  . atenolol (TENORMIN) 25 MG tablet TAKE ONE TABLET  EACH DAY 90 tablet 3  . cetirizine (ZYRTEC) 10 MG tablet Take 1 tablet (10 mg total) by mouth daily. 90 tablet 3  . hydrochlorothiazide (MICROZIDE) 12.5 MG capsule TAKE ONE CAPSULE EACH DAY 90 capsule 3  . metoCLOPramide (REGLAN) 5 MG tablet Take 1 tablet (5 mg total) by mouth 2 (two) times daily. 30 tablet 0  . Multiple Vitamin (MULTIVITAMIN) capsule Take 1 capsule by mouth daily.    . ondansetron (ZOFRAN) 4 MG tablet TAKE ONE TABLET EVERY EIGHT HOURS AS NEEDED FOR NAUSEA AND VOMITING (AND TO PREVENT DIARRHEA-MAY USE 2 TABLETS ) 30 tablet 0  . pravastatin (PRAVACHOL) 20 MG tablet Take 1 tablet (20 mg total) by mouth at bedtime. 90 tablet 0  . Probiotic Product (PRO-BIOTIC BLEND PO) Take 1 tablet by mouth daily.    Marland Kitchen triamcinolone cream (KENALOG) 0.1 % Apply 1 application topically 2 (two) times daily. (Patient taking differently: Apply 1 application topically 2 (two) times daily as needed. ) 45 g 0  . umeclidinium bromide (INCRUSE ELLIPTA) 62.5 MCG/INH AEPB Inhale 1 puff into the lungs daily. 1 each 0  . zolpidem (AMBIEN) 10 MG tablet TAKE ONE TABLET AT BEDTIME AS NEEDED FOR SLEEP 30 tablet 2   Current Facility-Administered Medications on File Prior to Visit  Medication Dose Route Frequency Provider Last Rate Last Dose  . albuterol (PROVENTIL) (  5 MG/ML) 0.5% nebulizer solution 2.5 mg  2.5 mg Nebulization Once Rita Ohara, MD       ROS as in subjective    Objective: BP 120/70   Pulse 82   Temp 97.8 F (36.6 C)   Wt 152 lb 12.8 oz (69.3 kg)   SpO2 99%   BMI 24.48 kg/m   General appearance: Alert, WD/WN, no distress,somewhat ill appearing                             Skin: warm, no rash, no diaphoresis                           Head: +frontal sinus tenderness                            Eyes: conjunctiva normal, corneas clear, PERRLA                            Ears: pearly TMs, external ear canals normal                          Nose: septum midline, turbinates swollen, with erythema  and no discharge             Mouth/throat: MMM, tongue normal,no pharyngeal erythema                           Neck: supple, no adenopathy, no thyromegaly, nontender                          Heart: RRR, normal S1, S2, no murmurs                         Lungs: +bronchial breath sounds, +left lower rhonchi, no wheezes, no rales                Extremities: no edema, nontender      Assessment: Encounter Diagnoses  Name Primary?  . Sinobronchitis Yes  . Cough   . Mild intermittent asthma without complication      Plan:  Medication orders today as below.  Discussed diagnosis and treatment.  Suggested symptomatic OTC remedies for cough and congestion.  Tylenol OTC for fever and malaise.  Call/return in 2-3 days if symptoms are worse or not improving.  Advised that cough may linger even after the infection is improved.     Ulla was seen today for coughing congestion.  Diagnoses and all orders for this visit:  Sinobronchitis  Cough  Mild intermittent asthma without complication  Other orders -     albuterol (PROVENTIL HFA;VENTOLIN HFA) 108 (90 Base) MCG/ACT inhaler; Inhale 2 puffs into the lungs as needed. -     azithromycin (ZITHROMAX) 250 MG tablet; 2 tablets day 1, then 1 tablet days 2-4 -     promethazine-dextromethorphan (PROMETHAZINE-DM) 6.25-15 MG/5ML syrup; Take 5 mLs by mouth 4 (four) times daily as needed for cough.

## 2017-06-03 NOTE — Telephone Encounter (Signed)
Can pt have a refill on meds  

## 2017-06-04 ENCOUNTER — Encounter: Payer: Self-pay | Admitting: Internal Medicine

## 2017-06-04 ENCOUNTER — Ambulatory Visit: Payer: PPO | Admitting: Internal Medicine

## 2017-06-04 VITALS — BP 100/70 | HR 68 | Ht 66.25 in | Wt 152.0 lb

## 2017-06-04 DIAGNOSIS — Z800 Family history of malignant neoplasm of digestive organs: Secondary | ICD-10-CM | POA: Diagnosis not present

## 2017-06-04 DIAGNOSIS — K580 Irritable bowel syndrome with diarrhea: Secondary | ICD-10-CM

## 2017-06-04 DIAGNOSIS — Z86010 Personal history of colonic polyps: Secondary | ICD-10-CM | POA: Diagnosis not present

## 2017-06-04 NOTE — Patient Instructions (Signed)
Glad your doing well.   Follow up with Dr Carlean Purl in a year or sooner if needed.   We will put you in for a colonoscopy recall for May 2019.   I appreciate the opportunity to care for you. Silvano Rusk, MD, [facility]

## 2017-06-04 NOTE — Progress Notes (Signed)
Shelby Randolph 67 y.o. 1951-05-05 [id number]  Assessment & Plan:   Encounter Diagnoses  Name Primary?  . Irritable bowel syndrome with diarrhea Yes  . Hx of colonic polyps   . Family history of colon cancer    1. IBS-D: Continue taking alosteron 0.5 mg 1 tablet BID. ? If she is taking only 1 qd - will clarify)Can take zofran if needed for nausea.   2. Hx of colonic polyps/fhx of colon CA: Pt is due for a colonoscopy, she prefers waiting until spring to do it. Will relay to staff for a spring reminder.   I have personally seen the patient, reviewed and repeated key elements of the history and physical and participated in formation of the assessment and plan the student has documented.  Gatha Mayer, MD, [facility]   Subjective:   Chief Complaint: IBS-D f/u  HPI Pt is a 67 yo female returning today for f/u of her IBS-D. At her last visit she was started on alosteron 0.5 mg 2 tabs BID. Today she reports that she is doing much better and improvement of IBS-D symptoms. She does report that she is currently only taking 1/2 of her prescribed dose d/t feeling nauseous with original regimen. She still has occasional need for zofran but reports improvement of nausea with dose decrease. No other complaints at this time.  Allergies  Allergen Reactions  . Sulfonamide Derivatives Shortness Of Breath and Swelling  . Mucinex [Guaifenesin Er] Other (See Comments)    "Jittery and hypes me up"   Current Meds  Medication Sig  . acetaminophen (TYLENOL) 500 MG tablet 1-2 tablets twice to three times daily as needed.  Marland Kitchen albuterol (PROVENTIL HFA;VENTOLIN HFA) 108 (90 Base) MCG/ACT inhaler Inhale 2 puffs into the lungs as needed.  Marland Kitchen alosetron (LOTRONEX) 0.5 MG tablet Take 2 tablets (1 mg total) by mouth 2 (two) times daily.  Marland Kitchen atenolol (TENORMIN) 25 MG tablet TAKE ONE TABLET EACH DAY  . azithromycin (ZITHROMAX) 250 MG tablet 2 tablets day 1, then 1 tablet days 2-4  . cetirizine (ZYRTEC) 10 MG  tablet Take 1 tablet (10 mg total) by mouth daily.  . hydrochlorothiazide (MICROZIDE) 12.5 MG capsule TAKE ONE CAPSULE EACH DAY  . Multiple Vitamin (MULTIVITAMIN) capsule Take 1 capsule by mouth daily.  . ondansetron (ZOFRAN) 4 MG tablet TAKE ONE TABLET EVERY EIGHT HOURS AS NEEDED FOR NAUSEA AND VOMITING (AND TO PREVENT DIARRHEA-MAY USE 2 TABLETS )  . pravastatin (PRAVACHOL) 20 MG tablet Take 1 tablet (20 mg total) by mouth at bedtime.  . Probiotic Product (PRO-BIOTIC BLEND PO) Take 1 tablet by mouth daily.  . promethazine-dextromethorphan (PROMETHAZINE-DM) 6.25-15 MG/5ML syrup Take 5 mLs by mouth 4 (four) times daily as needed for cough.  . triamcinolone cream (KENALOG) 0.1 % Apply 1 application topically 2 (two) times daily. (Patient taking differently: Apply 1 application topically 2 (two) times daily as needed. )  . umeclidinium bromide (INCRUSE ELLIPTA) 62.5 MCG/INH AEPB Inhale 1 puff into the lungs daily.  Marland Kitchen zolpidem (AMBIEN) 10 MG tablet TAKE ONE TABLET AT BEDTIME AS NEEDED FOR SLEEP   Current Facility-Administered Medications for the 06/04/17 encounter (Office Visit) with Gatha Mayer, MD  Medication  . albuterol (PROVENTIL) (5 MG/ML) 0.5% nebulizer solution 2.5 mg   Past Medical History:  Diagnosis Date  . Adenomatous colon polyp   . Allergy   . Anemia   . Anxiety   . Asthma   . Atherosclerosis of aorta ([place]) 03/2014   abnormal  on ultrasound, repeat US within 5 years  . Depression   . Diverticulosis   . Eye injury    in college, some pertinent loss of vision in left eye  . Genital herpes   . GERD (gastroesophageal reflux disease)   . History of bone density study 09/2011   normal  . History of mammogram    Dr. Radene Knee  . History of melanoma    sees [facility], dermatology  . History of PFTs 01/20/2011   moderate airflow limitation, no significant response to bronchodilator  . Hypertension 12/2011  . Insomnia   . Migraines    Dr. Melton Alar, migraines  . Nearsightedness     wears glasses  . Routine gynecological examination    Dr. Radene Knee  . Zika virus disease 06/2014   Past Surgical History:  Procedure Laterality Date  . COLONOSCOPY  2016   Dr. Silvano Rusk, 2014 Dr. Earlean Shawl  . ESOPHAGOGASTRODUODENOSCOPY    . ROTATOR CUFF REPAIR Right 05/2010  . TONSILLECTOMY AND ADENOIDECTOMY    . VAGINAL HYSTERECTOMY  1998   total; abnormal peroids  . 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   Review of Systems Positive for nausea. + URI on zithromax Negative for all other GI sxs and all other systems.   Objective:   Physical Exam  @BP  100/70   Pulse 68   Ht 5' 6.25" (1.683 m)   Wt 152 lb (68.9 kg)   BMI 24.35 kg/m @    Data Reviewed: prior GI notes, meds.

## 2017-06-07 DIAGNOSIS — Z8371 Family history of colonic polyps: Secondary | ICD-10-CM | POA: Diagnosis not present

## 2017-06-07 DIAGNOSIS — Z800 Family history of malignant neoplasm of digestive organs: Secondary | ICD-10-CM | POA: Diagnosis not present

## 2017-06-07 DIAGNOSIS — Z124 Encounter for screening for malignant neoplasm of cervix: Secondary | ICD-10-CM | POA: Diagnosis not present

## 2017-06-07 DIAGNOSIS — Z205 Contact with and (suspected) exposure to viral hepatitis: Secondary | ICD-10-CM | POA: Diagnosis not present

## 2017-06-07 DIAGNOSIS — Z1231 Encounter for screening mammogram for malignant neoplasm of breast: Secondary | ICD-10-CM | POA: Diagnosis not present

## 2017-06-07 DIAGNOSIS — Z6824 Body mass index (BMI) 24.0-24.9, adult: Secondary | ICD-10-CM | POA: Diagnosis not present

## 2017-06-07 DIAGNOSIS — Z86010 Personal history of colonic polyps: Secondary | ICD-10-CM | POA: Diagnosis not present

## 2017-06-07 DIAGNOSIS — Z803 Family history of malignant neoplasm of breast: Secondary | ICD-10-CM | POA: Diagnosis not present

## 2017-06-07 DIAGNOSIS — Z779 Other contact with and (suspected) exposures hazardous to health: Secondary | ICD-10-CM | POA: Diagnosis not present

## 2017-06-09 LAB — HM MAMMOGRAPHY

## 2017-06-23 ENCOUNTER — Other Ambulatory Visit: Payer: Self-pay | Admitting: Medical

## 2017-06-23 NOTE — Telephone Encounter (Signed)
Can pt have a refill on meds  

## 2017-06-23 NOTE — Telephone Encounter (Signed)
Call out

## 2017-07-06 ENCOUNTER — Encounter: Payer: Self-pay | Admitting: Medical

## 2017-07-06 ENCOUNTER — Ambulatory Visit (INDEPENDENT_AMBULATORY_CARE_PROVIDER_SITE_OTHER): Payer: PPO | Admitting: Medical

## 2017-07-06 VITALS — BP 110/70 | HR 58 | Ht 66.0 in | Wt 153.4 lb

## 2017-07-06 DIAGNOSIS — J4520 Mild intermittent asthma, uncomplicated: Secondary | ICD-10-CM

## 2017-07-06 DIAGNOSIS — R110 Nausea: Secondary | ICD-10-CM

## 2017-07-06 DIAGNOSIS — K580 Irritable bowel syndrome with diarrhea: Secondary | ICD-10-CM | POA: Diagnosis not present

## 2017-07-06 DIAGNOSIS — G4700 Insomnia, unspecified: Secondary | ICD-10-CM

## 2017-07-06 DIAGNOSIS — K529 Noninfective gastroenteritis and colitis, unspecified: Secondary | ICD-10-CM

## 2017-07-06 DIAGNOSIS — I700 Atherosclerosis of aorta: Secondary | ICD-10-CM | POA: Diagnosis not present

## 2017-07-06 DIAGNOSIS — E785 Hyperlipidemia, unspecified: Secondary | ICD-10-CM

## 2017-07-06 DIAGNOSIS — E559 Vitamin D deficiency, unspecified: Secondary | ICD-10-CM

## 2017-07-06 DIAGNOSIS — Z860100 Personal history of colon polyps, unspecified: Secondary | ICD-10-CM

## 2017-07-06 DIAGNOSIS — I10 Essential (primary) hypertension: Secondary | ICD-10-CM

## 2017-07-06 DIAGNOSIS — J309 Allergic rhinitis, unspecified: Secondary | ICD-10-CM | POA: Diagnosis not present

## 2017-07-06 DIAGNOSIS — F411 Generalized anxiety disorder: Secondary | ICD-10-CM

## 2017-07-06 DIAGNOSIS — Z0000 Encounter for general adult medical examination without abnormal findings: Secondary | ICD-10-CM | POA: Diagnosis not present

## 2017-07-06 DIAGNOSIS — L608 Other nail disorders: Secondary | ICD-10-CM

## 2017-07-06 NOTE — Patient Instructions (Addendum)
Thanks for trusting [place] with your health care and for coming in for a physical today.  Below are some general recommendations I have for you:  Yearly screenings See your eye doctor yearly for routine vision care. See your dentist yearly for routine dental care including hygiene visits twice yearly. See your gynecologist yearly for routine gynecological care. See your dermatologist periodically for skin surveillance See me here yearly for a routine physical and preventative care visit  Immunizations "vaccines"  I recommend you have a yearly influenza vaccine typically in the fall such as September yearly to prevent the flu  I recommend you have a shingles vaccine to help prevent shingles or herpes zoster outbreak.   Please call your insurer to inquire about coverage for the Shingrix vaccine given in 2 doses.   Some insurers cover this vaccine after age 26, some cover this after age 7.  If your insurer covers this, then call to schedule appointment to have this vaccine here.  There is a new Pneumococcal vaccine drug study currently.   If you want to consider this, let me know and I'll get you in touch with the study.   Cancer screening  Colon cancer screening - follow up with Dr. Carlean Purl  Breast cancer screening - follow up with gynecology  Cervical cancer screening - follow up with gynecology  Specific Concerns today: hypertension - continue you same medication  Insomnia - we may consider a sleep study going forward.   I will send a trial of Belsroma 15mg  to your pharmacy.  Try taking this nightly for a week instead of Ambien to see how this works for you.  Osteopenia - I recommend aerobic and weight bearing exercise several days per week, I recommend you get 1200mg  of calcium in the diet or with supplement, and get at least 1000 IU Vitamin D daily.    Toenail deformity - pending labs, I may have you use a trial of Lamisil oral tablet daily for 4-6 weeks.  Asthma - no recent  concerns, but your breathing test spirometry was abnormal.

## 2017-07-06 NOTE — Progress Notes (Addendum)
Subjective:    Shelby Randolph is a 67 y.o. female who presents for Preventative Services visit and chronic medical problems/med check visit.    Primary Care Provider Elber Galyean, Camelia Eng, PA-C here for primary care  Current Health Care Team:  Dentist, Dr Posey Pronto  Eye doctor, [place]  Dr. Silvano Rusk, GI  Dr. Renda Rolls, dermatology  Dr. Radene Knee, [place] you may have received from other than [facility] providers in the past year (date may be approximate) none  Exercise Current exercise habits: walks, spin twice weekly,yoga  Nutrition/Diet Current diet: healthy, but lately more protein and less vegetables  Depression Screen Depression screen [facility] 2/9 07/06/2017  Decreased Interest 0  Down, Depressed, Hopeless 0  PHQ - 2 Score 0    Activities of Daily Living Screen/Functional Status Survey Is the patient deaf or have difficulty hearing?: No Does the patient have difficulty seeing, even when wearing glasses/contacts?: No Does the patient have difficulty concentrating, remembering, or making decisions?: No Does the patient have difficulty walking or climbing stairs?: No Does the patient have difficulty dressing or bathing?: No Does the patient have difficulty doing errands alone such as visiting a doctor's office or shopping?: No  Can patient draw a clock face showing 3:15 o'clock, yes  Fall Risk Screen Fall Risk  07/06/2017 06/25/2016 05/23/2015  Falls in the past year? No Yes No  Number falls in past yr: - 1 -  Injury with Fall? - Yes -  Comment - rt hip  -  Risk for fall due to : - Other (Comment) -    Gait Assessment: Normal gait observed yes  Advanced directives Does patient have a [place]? yes Does patient have a Living Will? yes  Past Medical History:  Diagnosis Date  . Adenomatous colon polyp   . Allergy   . Anemia   . Anxiety   . Asthma   . Atherosclerosis of aorta ([place]) 03/2014   abnormal on ultrasound, repeat US  within 5 years  . Depression   . Diverticulosis   . Eye injury    in college, some pertinent loss of vision in left eye  . Genital herpes   . GERD (gastroesophageal reflux disease)   . History of bone density study 09/2011   normal  . History of mammogram    Dr. Radene Knee  . History of melanoma    sees [facility], dermatology  . History of PFTs 01/20/2011   moderate airflow limitation, no significant response to bronchodilator  . Hypertension 12/2011  . Insomnia   . Migraines    Dr. Melton Alar, migraines  . Nearsightedness    wears glasses  . Routine gynecological examination    Dr. Radene Knee  . Zika virus disease 06/2014    Past Surgical History:  Procedure Laterality Date  . COLONOSCOPY  2016   Dr. Silvano Rusk, 2014 Dr. Earlean Shawl  . ESOPHAGOGASTRODUODENOSCOPY    . ROTATOR CUFF REPAIR Right 05/2010  . TONSILLECTOMY AND ADENOIDECTOMY    . VAGINAL HYSTERECTOMY  1998   total; abnormal peroids  . VARICOSE VEIN SURGERY     bilat    Social History   Socioeconomic History  . Marital status: Widowed    Spouse name: Not on file  . Number of children: 5  . Years of education: Not on file  . Highest education level: Not on file  Social Needs  . Financial resource strain: Not on file  . Food insecurity - worry: Not on  file  . Food insecurity - inability: Not on file  . Transportation needs - medical: Not on file  . Transportation needs - non-medical: Not on file  Occupational History  . Occupation: compliance agent (RN)    Employer: [employer]    Comment: Nurse Practitioner  Tobacco Use  . Smoking status: Never Smoker  . Smokeless tobacco: Never Used  Substance and Sexual Activity  . Alcohol use: Yes    Alcohol/week: 1.2 oz    Types: 2 Glasses of wine per week  . Drug use: No  . Sexual activity: Not on file    Comment: exercise - walks, [place], cardio at gym 3 x/wk, yoga, widowed   Other Topics Concern  . 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 COPD Mother        died age 55yo.  emphysema  . Colon cancer Father 36        died age 25yo  . Colon cancer Brother 71  . Breast cancer Maternal Aunt   . Colon cancer Paternal Uncle        2  . Breast cancer Daughter   . Diabetes Neg Hx   . Heart disease Neg Hx   . Hypertension Neg Hx   . Stroke Neg Hx      Current Outpatient Medications:  .  acetaminophen (TYLENOL) 500 MG tablet, 1-2 tablets twice to three times daily as needed., Disp: 100 tablet, Rfl: 5 .  albuterol (PROVENTIL HFA;VENTOLIN HFA) 108 (90 Base) MCG/ACT inhaler, Inhale 2 puffs into the lungs as needed., Disp: 1 Inhaler, Rfl: 1 .  alosetron (LOTRONEX) 0.5 MG tablet, Take 2 tablets (1 mg total) by mouth 2 (two) times daily., Disp: 60 tablet, Rfl: 2 .  atenolol (TENORMIN) 25 MG tablet, TAKE ONE TABLET EACH DAY, Disp: 90 tablet, Rfl: 3 .  cetirizine (ZYRTEC) 10 MG tablet, Take 1 tablet (10 mg total) by mouth daily., Disp: 90 tablet, Rfl: 3 .  hydrochlorothiazide (MICROZIDE) 12.5 MG capsule, TAKE ONE CAPSULE EACH DAY, Disp: 90 capsule, Rfl: 3 .  Multiple Vitamin (MULTIVITAMIN) capsule, Take 1 capsule by mouth daily., Disp: , Rfl:  .  ondansetron (ZOFRAN) 4 MG tablet, TAKE ONE TABLET EVERY EIGHT HOURS AS NEEDED FOR NAUSEA AND VOMITING (AND TO PREVENT DIARRHEA-MAY USE 2 TABLETS ), Disp: 30 tablet, Rfl: 0 .  pravastatin (PRAVACHOL) 20 MG tablet, Take 1 tablet (20 mg total) by mouth at bedtime., Disp: 90 tablet, Rfl: 0 .  Probiotic Product (PRO-BIOTIC BLEND PO), Take 1 tablet by mouth daily., Disp: , Rfl:  .  triamcinolone cream (KENALOG) 0.1 %, Apply 1 application topically 2 (two) times daily. (Patient taking differently: Apply 1  application topically 2 (two) times daily as needed. ), Disp: 45 g, Rfl: 0 .  umeclidinium bromide (INCRUSE ELLIPTA) 62.5 MCG/INH AEPB, Inhale 1 puff into the lungs daily., Disp: 1 each, Rfl: 0 .  zolpidem (AMBIEN) 10 MG tablet, TAKE ONE TABLET AT BEDTIME AS NEEDED FOR SLEEP, Disp: 30 tablet, Rfl: 2  Current Facility-Administered Medications:  .  albuterol (PROVENTIL) (5 MG/ML) 0.5% nebulizer solution 2.5 mg, 2.5 mg, Nebulization, Once, Rita Ohara, MD  Allergies  Allergen  Reactions  . Sulfonamide Derivatives Shortness Of Breath and Swelling  . Mucinex [Guaifenesin Er] Other (See Comments)    "Jittery and hypes me up"    History reviewed: allergies, current medications, past family history, past medical history, past social history, past surgical history and problem list  issues discussed: Asthma - no recent issues.  Doing fine  compliant with BP medication  Always cold  Saw dentist recently.  Found out her dental hygienes at 67yo has breast cancer, told her she was dying this morning.  Having updated colonoscopy in spring, sees Dr. Carlean Purl. Has done a lot better with her IBS-diarrhea since last year.  Dr. Carlean Purl  Lately eating more protein, less vegetables, and this seems to be going well for her.    Working full time, 3 dogs  Left great toenail is thickened and discolored , thinks she has toenail fungus.  Using Ambien nightly but does feel some grogginess the next morning.  Does practice good hygiene.   Mammogram updated last month, pap up to date.   Objective:     Biometrics BP 110/70   Pulse (!) 58   Ht 5\' 6"  (1.676 m)   Wt 153 lb 6.4 oz (69.6 kg)   SpO2 99%   BMI 24.76 kg/m   Wt Readings from Last 3 Encounters:  07/06/17 153 lb 6.4 oz (69.6 kg)  06/04/17 152 lb (68.9 kg)  06/03/17 152 lb 12.8 oz (69.3 kg)    Cognitive Testing  Alert? Yes  Normal Appearance?Yes  Oriented to person? Yes  Place? Yes   Time? Yes  Recall of three objects?  Yes  Can perform  simple calculations? Yes  Displays appropriate judgment?Yes  Can read the correct time from a watch face?Yes  General appearance: alert, no distress, WD/WN, white female  Nutritional Status: Inadequate calore intake? no Loss of muscle mass? no Loss of fat beneath skin? no Localized or general edema? no Diminished functional status? no  Other pertinent exam: HEENT: normocephalic, sclerae anicteric, TMs pearly, nares patent, no discharge or erythema, pharynx normal Oral cavity: MMM, no lesions Neck: supple, no lymphadenopathy, no thyromegaly, no masses Heart: RRR, normal S1, S2, no murmurs Lungs: CTA bilaterally, no wheezes, rhonchi, or rales Abdomen: +bs, soft, non tender, non distended, no masses, no hepatomegaly, no splenomegaly Musculoskeletal: UE and LE nontender, no swelling, no obvious deformity Extremities: no edema, no cyanosis, no clubbing, mild to moderate LE varicosities Pulses: 2+ symmetric, upper and lower extremities, normal cap refill Neurological: alert, oriented x 3, CN2-12 intact, strength normal upper extremities and lower extremities, sensation normal throughout, DTRs 2+ throughout, no cerebellar signs, gait normal Psychiatric: normal affect, behavior normal, pleasant  Skin: scattered macules, no worrisome lesions, scattered seborrheic keratoses on back  Assessment:   Encounter Diagnoses  Name Primary?  . Essential hypertension Yes  . Routine general medical examination at a health care facility   . Vitamin D deficiency   . Insomnia, unspecified type   . History of colonic polyps   . Generalized anxiety disorder   . Dyslipidemia   . Chronic nausea   . Irritable bowel syndrome with diarrhea   . Chronic diarrhea   . Mild intermittent asthma without complication   . Allergic rhinitis, unspecified seasonality, unspecified trigger   . Atherosclerosis of abdominal aorta ([place])   . Toenail deformity      Plan:   A preventative services visit was completed  today.  During the course of the visit today, we discussed and counseled about  appropriate screening and preventive services.  A health risk assessment was established today that included a review of current medications, allergies, social history, family history, medical and preventative health history, biometrics, and preventative screenings to identify potential safety concerns or impairments.  A personalized plan was printed today for your records and use.   Personalized health advice and education was given today to reduce health risks and promote self management and wellness.  Information regarding end of life planning was discussed today.   Patient Instructions  Thanks for trusting [place] with your health care and for coming in for a physical today.  Below are some general recommendations I have for you:  Yearly screenings See your eye doctor yearly for routine vision care. See your dentist yearly for routine dental care including hygiene visits twice yearly. See your gynecologist yearly for routine gynecological care. See your dermatologist periodically for skin surveillance See me here yearly for a routine physical and preventative care visit  Immunizations "vaccines"  I recommend you have a yearly influenza vaccine typically in the fall such as September yearly to prevent the flu  I recommend you have a shingles vaccine to help prevent shingles or herpes zoster outbreak.   Please call your insurer to inquire about coverage for the Shingrix vaccine given in 2 doses.   Some insurers cover this vaccine after age 85, some cover this after age 38.  If your insurer covers this, then call to schedule appointment to have this vaccine here.  There is a new Pneumococcal vaccine drug study currently.   If you want to consider this, let me know and I'll get you in touch with the study.   Cancer screening  Colon cancer screening - follow up with Dr. Carlean Purl  Breast cancer screening - follow up  with gynecology  Cervical cancer screening - follow up with gynecology  Specific Concerns today: hypertension - continue you same medication  Insomnia - we may consider a sleep study going forward.   I will send a trial of Belsroma 15mg  to your pharmacy.  Try taking this nightly for a week instead of Ambien to see how this works for you.  Osteopenia - I recommend aerobic and weight bearing exercise several days per week, I recommend you get 1200mg  of calcium in the diet or with supplement, and get at least 1000 IU Vitamin D daily.    Toenail deformity - pending labs, I may have you use a trial of Lamisil oral tablet daily for 4-6 weeks.  Asthma - no recent concerns, but your breathing test spirometry was abnormal.   Referrals today: none  Immunizations: I recommended a yearly influenza vaccine, typically in September when the vaccine is usually available Is the Pneumococcal vaccine up to date: yes. Is the Td/Tdap vaccine up to date: yes. I recommend you have a shingles vaccine to help prevent shingles or herpes zoster outbreak.   Please call your insurer to inquire about coverage for the Shingrix vaccine given in 2 doses.   Some insurers cover this vaccine after age 50, some cover this after age 70.  If your insurer covers this, then call to schedule appointment to have this vaccine here.   Medicare Attestation A preventative services visit was completed today.  During the course of the visit the patient was educated and counseled about appropriate screening and preventive services.  A health risk assessment was established with the patient that included a review of current medications, allergies, social history, family history, medical and  preventative health history, biometrics, and preventative screenings to identify potential safety concerns or impairments.  A personalized plan was printed today for the patient's records and use.   Personalized health advice and education was given  today to reduce health risks and promote self management and wellness.  Information regarding end of life planning was discussed today.  Dorothea Ogle, PA-C   07/06/2017

## 2017-07-07 ENCOUNTER — Other Ambulatory Visit: Payer: PPO

## 2017-07-07 DIAGNOSIS — E785 Hyperlipidemia, unspecified: Secondary | ICD-10-CM | POA: Diagnosis not present

## 2017-07-07 DIAGNOSIS — I10 Essential (primary) hypertension: Secondary | ICD-10-CM | POA: Diagnosis not present

## 2017-07-07 DIAGNOSIS — Z0000 Encounter for general adult medical examination without abnormal findings: Secondary | ICD-10-CM | POA: Diagnosis not present

## 2017-07-08 LAB — COMPREHENSIVE METABOLIC PANEL
ALBUMIN: 4.4 g/dL (ref 3.6–4.8)
ALT: 15 IU/L (ref 0–32)
AST: 20 IU/L (ref 0–40)
Albumin/Globulin Ratio: 1.9 (ref 1.2–2.2)
Alkaline Phosphatase: 52 IU/L (ref 39–117)
BUN / CREAT RATIO: 21 (ref 12–28)
BUN: 18 mg/dL (ref 8–27)
Bilirubin Total: 0.6 mg/dL (ref 0.0–1.2)
CALCIUM: 9.8 mg/dL (ref 8.7–10.3)
CHLORIDE: 104 mmol/L (ref 96–106)
CO2: 25 mmol/L (ref 20–29)
Creatinine, Ser: 0.87 mg/dL (ref 0.57–1.00)
GFR, EST AFRICAN AMERICAN: 80 mL/min/{1.73_m2} (ref >59)
GFR, EST NON AFRICAN AMERICAN: 69 mL/min/{1.73_m2} (ref >59)
GLUCOSE: 85 mg/dL (ref 65–99)
Globulin, Total: 2.3 g/dL (ref 1.5–4.5)
Potassium: 4 mmol/L (ref 3.5–5.2)
Sodium: 143 mmol/L (ref 134–144)
TOTAL PROTEIN: 6.7 g/dL (ref 6.0–8.5)

## 2017-07-08 LAB — LIPID PANEL
Chol/HDL Ratio: 2.9 ratio (ref 0.0–4.4)
Cholesterol, Total: 237 mg/dL — ABNORMAL HIGH (ref 100–199)
HDL: 83 mg/dL (ref >39)
LDL Calculated: 134 mg/dL — ABNORMAL HIGH (ref 0–99)
TRIGLYCERIDES: 99 mg/dL (ref 0–149)
VLDL Cholesterol Cal: 20 mg/dL (ref 5–40)

## 2017-07-08 LAB — VITAMIN D 25 HYDROXY (VIT D DEFICIENCY, FRACTURES): Vit D, 25-Hydroxy: 28.4 ng/mL — ABNORMAL LOW (ref 30.0–100.0)

## 2017-07-09 ENCOUNTER — Other Ambulatory Visit: Payer: Self-pay | Admitting: Medical

## 2017-07-09 MED ORDER — ATENOLOL 25 MG PO TABS: ORAL_TABLET | ORAL | 3 refills | Status: DC

## 2017-07-09 MED ORDER — TERBINAFINE HCL 250 MG PO TABS: 250.0000 mg | ORAL_TABLET | Freq: Every day | ORAL | 0 refills | Status: AC

## 2017-07-09 MED ORDER — SUVOREXANT 15 MG PO TABS: 1.0000 | ORAL_TABLET | Freq: Every day | ORAL | 2 refills | Status: DC

## 2017-07-09 MED ORDER — HYDROCHLOROTHIAZIDE 12.5 MG PO CAPS: ORAL_CAPSULE | ORAL | 3 refills | Status: DC

## 2017-07-09 MED ORDER — ATORVASTATIN CALCIUM 20 MG PO TABS: 20.0000 mg | ORAL_TABLET | Freq: Every day | ORAL | 3 refills | Status: DC

## 2017-07-09 MED ORDER — VITAMIN D 1000 UNITS PO TABS: 1000.0000 [IU] | ORAL_TABLET | Freq: Every day | ORAL | 3 refills | Status: DC

## 2017-07-16 ENCOUNTER — Encounter: Payer: Self-pay | Admitting: Medical

## 2017-07-16 NOTE — Telephone Encounter (Signed)
Please advise 

## 2017-07-19 ENCOUNTER — Other Ambulatory Visit: Payer: Self-pay | Admitting: Medical

## 2017-07-19 MED ORDER — ZOLPIDEM TARTRATE 10 MG PO TABS: ORAL_TABLET | ORAL | 0 refills | Status: DC

## 2017-07-30 ENCOUNTER — Other Ambulatory Visit: Payer: PPO

## 2017-08-16 DIAGNOSIS — L820 Inflamed seborrheic keratosis: Secondary | ICD-10-CM | POA: Diagnosis not present

## 2017-08-16 DIAGNOSIS — Z809 Family history of malignant neoplasm, unspecified: Secondary | ICD-10-CM | POA: Diagnosis not present

## 2017-08-16 DIAGNOSIS — L814 Other melanin hyperpigmentation: Secondary | ICD-10-CM | POA: Diagnosis not present

## 2017-08-16 DIAGNOSIS — L821 Other seborrheic keratosis: Secondary | ICD-10-CM | POA: Diagnosis not present

## 2017-08-16 DIAGNOSIS — L570 Actinic keratosis: Secondary | ICD-10-CM | POA: Diagnosis not present

## 2017-08-16 DIAGNOSIS — Z85828 Personal history of other malignant neoplasm of skin: Secondary | ICD-10-CM | POA: Diagnosis not present

## 2017-08-16 DIAGNOSIS — D225 Melanocytic nevi of trunk: Secondary | ICD-10-CM | POA: Diagnosis not present

## 2017-08-16 DIAGNOSIS — D1801 Hemangioma of skin and subcutaneous tissue: Secondary | ICD-10-CM | POA: Diagnosis not present

## 2017-08-17 ENCOUNTER — Encounter: Payer: Self-pay | Admitting: Internal Medicine

## 2017-08-20 ENCOUNTER — Encounter: Payer: Self-pay | Admitting: Internal Medicine

## 2017-08-20 ENCOUNTER — Telehealth: Payer: Self-pay

## 2017-08-20 DIAGNOSIS — Z1509 Genetic susceptibility to other malignant neoplasm: Secondary | ICD-10-CM

## 2017-08-20 HISTORY — DX: Genetic susceptibility to other malignant neoplasm: Z15.09

## 2017-08-20 NOTE — Telephone Encounter (Signed)
-----   Message from Gatha Mayer, MD sent at 08/20/2017 10:45 AM EDT ----- Regarding: Lynch syndrome Please set her up for an EGD/colon direct  Recent discovery of Lynch syndrome via genetic testing  She was hoping to do before May 1 and I offered with a partner but that did not work out and medically ok to wait til I have openings

## 2017-08-20 NOTE — Telephone Encounter (Signed)
Patient has been scheduled for colon/endo and pre-visit for May and june

## 2017-08-30 ENCOUNTER — Telehealth: Payer: Self-pay | Admitting: Oncology

## 2017-08-30 NOTE — Telephone Encounter (Signed)
Left voicemail message for patient regarding appointment date/Time/location/phone#

## 2017-09-02 ENCOUNTER — Ambulatory Visit (INDEPENDENT_AMBULATORY_CARE_PROVIDER_SITE_OTHER): Payer: PPO | Admitting: Family Medicine

## 2017-09-02 ENCOUNTER — Encounter: Payer: Self-pay | Admitting: Family Medicine

## 2017-09-02 ENCOUNTER — Ambulatory Visit
Admission: RE | Admit: 2017-09-02 | Discharge: 2017-09-02 | Disposition: A | Discharge status: Home / Self Care | Payer: PPO | Source: Ambulatory Visit | Attending: Family Medicine | Admitting: Family Medicine

## 2017-09-02 VITALS — BP 100/60 | HR 68 | Ht 68.0 in | Wt 154.2 lb

## 2017-09-02 DIAGNOSIS — M79641 Pain in right hand: Secondary | ICD-10-CM

## 2017-09-02 NOTE — Patient Instructions (Signed)
  Go to [facility] [address] for x-ray of the right hand. If there are no significant abnormalities found, try using Aleve twice daily with food for 7-10 days and see if it starts to improve. If not effective, and not bothering your stomach, you can increase up to 2 Aleve twice daily (max 10 days).  Do not use other pain medications along with the aleve, other than tylenol. Splinting to immobilize the finger might be needed for a couple of days--consider taping over the weekend.

## 2017-09-02 NOTE — Progress Notes (Signed)
Chief Complaint  Patient presents with  . Hand Pain    right hand x 1 month, it in on the street when she was breaking up a dog fight.     Patient presents with complaint of 3-4 weeks of aching pain at her right 2nd MCP, and ache extends down the metacarpal into the back of the hand. She had hit her hand on the ground while breaking up dogfight, didn't pay much attention to it, pain has persisted.  It never really got swollen or bruised. Denies numbness, tingling, weakness.  She has tried tylenol as needed for pain, which does temporarily helps. Has not tried NSAIDs  Back on lotronex for IBS-D, working well  [facility], [place], [place] reviewed  Outpatient Encounter Medications as of 09/02/2017  Medication Sig  . alosetron (LOTRONEX) 0.5 MG tablet Take 2 tablets (1 mg total) by mouth 2 (two) times daily.  Marland Kitchen atenolol (TENORMIN) 25 MG tablet TAKE ONE TABLET EACH DAY  . atorvastatin (LIPITOR) 20 MG tablet Take 1 tablet (20 mg total) by mouth daily.  . cholecalciferol (VITAMIN D) 1000 units tablet Take 1 tablet (1,000 Units total) by mouth daily.  . hydrochlorothiazide (MICROZIDE) 12.5 MG capsule TAKE ONE CAPSULE EACH DAY  . Multiple Vitamin (MULTIVITAMIN) capsule Take 1 capsule by mouth daily.  . Probiotic Product (PRO-BIOTIC BLEND PO) Take 1 tablet by mouth daily.  Marland Kitchen zolpidem (AMBIEN) 10 MG tablet 1/2-1 tablet po QHS prn  . [DISCONTINUED] Suvorexant (BELSOMRA) 15 MG TABS Take 1 tablet by mouth at bedtime.  Marland Kitchen acetaminophen (TYLENOL) 500 MG tablet 1-2 tablets twice to three times daily as needed. (Patient not taking: Reported on 09/02/2017)  . albuterol (PROVENTIL HFA;VENTOLIN HFA) 108 (90 Base) MCG/ACT inhaler Inhale 2 puffs into the lungs as needed. (Patient not taking: Reported on 09/02/2017)  . ondansetron (ZOFRAN) 4 MG tablet TAKE ONE TABLET EVERY EIGHT HOURS AS NEEDED FOR NAUSEA AND VOMITING (AND TO PREVENT DIARRHEA-MAY USE 2 TABLETS ) (Patient not taking: Reported on 09/02/2017)  . triamcinolone cream  (KENALOG) 0.1 % Apply 1 application topically 2 (two) times daily. (Patient not taking: Reported on 09/02/2017)   Facility-Administered Encounter Medications as of 09/02/2017  Medication  . albuterol (PROVENTIL) (5 MG/ML) 0.5% nebulizer solution 2.5 mg   Allergies  Allergen Reactions  . Sulfonamide Derivatives Shortness Of Breath and Swelling  . Mucinex [Guaifenesin Er] Other (See Comments)    "Jittery and hypes me up"   ROS: no fever, chills, URI symptoms, bleeding, bruising, rash, other joint pains (just right hand), chest pain or other concerns. No abdominal pain, bowels well controlled.  PHYSICAL EXAM:  BP 100/60   Pulse 68   Ht 5\' 8"  (1.727 m)   Wt 154 lb 3.2 oz (69.9 kg)   BMI 23.45 kg/m   Well appearing female, in no distress Right hand: No soft tissue swelling, erythema, warmth or bony abnormality. Tender over the right index MCP, especially along the radial aspect of the knuckle.  Some pain with pushing fingers away from thumb. Normal sensation. Normal strength. Brisk capillary refill, normal pulses  ASSESSMENT/PLAN:  Right hand pain - Plan: DG Hand Complete Right  Suspect strain.  Check x-ray to r/o fracture. Discussed supportive measures including NSAIDs, immobilization. Discussed possibility of OT referral if persistent pain despite these measures.   Go to [facility] at [address] or [place] for x-ray of the right hand. If there are no significant abnormalities found, try using Aleve twice daily with food for 7-10 days and  see if it starts to improve. If not effective, and not bothering your stomach, you can increase up to 2 Aleve twice daily (max 10 days).  Do not use other pain medications along with the aleve, other than tylenol. Splinting to immobilize the finger might be needed for a couple of days--consider taping over the weekend.

## 2017-09-05 ENCOUNTER — Other Ambulatory Visit: Payer: Self-pay | Admitting: Internal Medicine

## 2017-09-08 NOTE — Progress Notes (Signed)
[place]  [phone] Fax:([phone]) [place] Note   Patient Care Team: Tysinger, Leward Quan as PCP - General (Family Medicine) 09/13/2017  CHIEF COMPLAINTS/PURPOSE OF CONSULTATION:  MSH6 gene mutation   HISTORY OF PRESENTING ILLNESS:  Shelby Randolph 67 y.o. female is a here because of newly diagnosed MSH6 gene mutation. The patient was referred by Dr.McComb GYN after she had genetic testing due to her extensive family history. The patient presents to the clinic today by herself.  She reports that she has had non-malignant polyps found on coloscopy in Dec 2016 by Dr. Carlean Purl. She follows up with him regularly and she will return this year for an Colonoscopy and Endoscopy in July 2019.   She has a medical history of GERD, Depression, and HTN. She has a surgical history of total hysterectomy and BSO in 1999.   She has a FMHx of colon cancer by her father, brother, paternal uncle and grandmother. Her daughter has breast cancer diagnosed at age 13 and her maternal aunt in her 30s.   GYN HISTORY  Menarchal: 3 LMP: Hysterectomy in 1999 due to bleeding Contraceptive: HRT: Yes G4P3: first birth at age 3, yes to breast feeding  She denies tobacco use and uses alcohol rarely. Socially, she works for [employer] and she is widowed so she lives by herself.   MEDICAL HISTORY:  Past Medical History:  Diagnosis Date  . Adenomatous colon polyp   . Allergy   . Anemia   . Anxiety   . Asthma   . Atherosclerosis of aorta ([place]) 03/2014   abnormal on ultrasound, repeat US within 5 years  . Depression   . Diverticulosis   . Eye injury    in college, some pertinent loss of vision in left eye  . Genital herpes   . GERD (gastroesophageal reflux disease)   . History of bone density study 09/2011   normal  . History of mammogram    Dr. Radene Knee  . History of melanoma    sees [facility], dermatology  . History of PFTs 01/20/2011   moderate airflow  limitation, no significant response to bronchodilator  . Hypertension 12/2011  . Insomnia   . Migraines    Dr. Melton Alar, migraines  . MSH6-related Lynch syndrome (HNPCC5) 08/20/2017  . Nearsightedness    wears glasses  . Routine gynecological examination    Dr. Radene Knee  . Zika virus disease 06/2014    SURGICAL HISTORY: Past Surgical History:  Procedure Laterality Date  . COLONOSCOPY  2016   Dr. Silvano Rusk, 2014 Dr. Earlean Shawl  . ESOPHAGOGASTRODUODENOSCOPY    . ROTATOR CUFF REPAIR Right 05/2010  . TONSILLECTOMY AND ADENOIDECTOMY    . VAGINAL HYSTERECTOMY  1998   total; abnormal peroids  . VARICOSE VEIN SURGERY     bilat    SOCIAL HISTORY: Social History   Socioeconomic History  . Marital status: Widowed    Spouse name: Not on file  . Number of children: 5  . Years of education: Not on file  . Highest education level: Not on file  Occupational History  . Occupation: compliance agent Investment banker, corporate)    Employer: [employer]    Comment: Nurse Practitioner  Social Needs  . Financial resource strain: Not on file  . Food insecurity:    Worry: Not on file    Inability: Not on file  . Transportation needs:    Medical: Not on file    Non-medical: Not on file  Tobacco Use  . Smoking status: Never Smoker  . Smokeless tobacco: Never Used  Substance and Sexual Activity  . Alcohol use: Yes    Alcohol/week: 1.2 oz    Types: 2 Glasses of wine per week  . Drug use: No  . Sexual activity: Not on file    Comment: exercise - walks, [place], cardio at gym 3 x/wk, yoga, widowed   Lifestyle  . Physical activity:    Days per week: Not on file    Minutes per session: Not on file  . Stress: Not on file  Relationships  . Social connections:    Talks on phone: Not on file    Gets together: Not on file    Attends religious service: Not on file    Active member of club or organization: Not on file    Attends meetings of clubs or organizations: Not on file    Relationship  status: Not on file  . Intimate partner violence:    Fear of current or ex partner: Not on file    Emotionally abused: Not on file    Physically abused: Not on file    Forced sexual activity: Not on file  Other Topics Concern  . 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 COPD Mother        died age 66yo.  emphysema  . Colon cancer Father 30        died age 21yo  . Colon cancer Brother 45  . Breast cancer Maternal Aunt   . Colon cancer Paternal Uncle        2  . Breast cancer Daughter   . Colon cancer Paternal Grandmother 31       colon cancer   . Diabetes Neg Hx   . Heart disease Neg Hx   . Hypertension Neg Hx   . Stroke Neg Hx     ALLERGIES:  is allergic to sulfonamide derivatives and mucinex [guaifenesin er].  MEDICATIONS:  Current Outpatient Medications  Medication Sig Dispense Refill  . acetaminophen (TYLENOL) 500 MG tablet 1-2 tablets twice to three times daily as needed. 100 tablet 5  . albuterol (PROVENTIL HFA;VENTOLIN HFA) 108 (90 Base) MCG/ACT inhaler Inhale 2 puffs into the lungs as needed. 1 Inhaler 1  . alosetron (LOTRONEX) 0.5 MG tablet Take 2 tablets (1 mg total) by mouth 2 (two) times daily. 60 tablet 2  . atenolol (TENORMIN) 25 MG tablet TAKE ONE TABLET EACH DAY 90 tablet 3  . atorvastatin (LIPITOR) 20 MG tablet Take 1 tablet (20 mg total) by mouth daily. 90 tablet 3  . cholecalciferol (VITAMIN D) 1000 units tablet Take 1 tablet (1,000 Units total) by mouth daily. 90 tablet 3  . hydrochlorothiazide (MICROZIDE) 12.5 MG capsule TAKE ONE CAPSULE EACH DAY 90 capsule 3  . Multiple Vitamin (MULTIVITAMIN) capsule Take 1 capsule by mouth daily.    .  ondansetron (ZOFRAN) 4 MG tablet TAKE ONE TABLET EVERY EIGHT HOURS AS NEEDED FOR NAUSEA AND VOMITING (AND TO PREVENT DIARRHEA-MAY USE 2 TABLETS ) 30 tablet 0  . ondansetron (ZOFRAN) 4 MG tablet TAKE ONE TABLET EVERY EIGHT HOURS AS NEEDED FOR NAUSEA AND  VOMITING /TO PREVENT DIARRHEA MAY USE 2 TABLETS 60 tablet 2  . Probiotic Product (PRO-BIOTIC BLEND PO) Take 1 tablet by mouth daily.    Marland Kitchen zolpidem (AMBIEN) 10 MG tablet 1/2-1 tablet po QHS prn 90 tablet 0  . triamcinolone cream (KENALOG) 0.1 % Apply 1 application topically 2 (two) times daily. (Patient not taking: Reported on 09/02/2017) 45 g 0   Current Facility-Administered Medications  Medication Dose Route Frequency Provider Last Rate Last Dose  . albuterol (PROVENTIL) (5 MG/ML) 0.5% nebulizer solution 2.5 mg  2.5 mg Nebulization Once Rita Ohara, MD        REVIEW OF SYSTEMS:   Constitutional: Denies fevers, chills or abnormal night sweats Eyes: Denies blurriness of vision, double vision or watery eyes Ears, nose, mouth, throat, and face: Denies mucositis or sore throat Respiratory: Denies cough, dyspnea or wheezes Cardiovascular: Denies palpitation, chest discomfort or lower extremity swelling Gastrointestinal:  Denies nausea, heartburn or change in bowel habits Skin: Denies abnormal skin rashes Lymphatics: Denies new lymphadenopathy or easy bruising Neurological:Denies numbness, tingling or new weaknesses Behavioral/Psych: Mood is stable, no new changes  All other systems were reviewed with the patient and are negative.  PHYSICAL EXAMINATION: ECOG PERFORMANCE STATUS: 0 - Asymptomatic  Vitals:   09/13/17 1517  BP: 131/77  Pulse: (!) 55  Resp: 19  Temp: 98.1 F (36.7 C)  SpO2: 98%   Filed Weights   09/13/17 1517  Weight: 155 lb 8 oz (70.5 kg)    Physical Exam deferred, purpose of visit was counseling   LABORATORY DATA:  I have reviewed the data as listed CBC Latest Ref Rng & Units 04/02/2017 06/25/2016 07/29/2015  WBC 4.0  - 10.5 K/uL 5.6 3.9(L) 3.6(L)  Hemoglobin 12.0 - 15.0 g/dL 12.9 12.5 12.5  Hematocrit 36.0 - 46.0 % 37.7 37.3 36.8  Platelets 150 - 400 K/uL 143(L) 172 144(L)    CMP Latest Ref Rng & Units 07/07/2017 04/02/2017 06/25/2016  Glucose 65 - 99 mg/dL 85 132(H) 90  BUN 8 - 27 mg/dL 18 27(H) 16  Creatinine 0.57 - 1.00 mg/dL 0.87 0.98 0.82  Sodium 134 - 144 mmol/L 143 138 140  Potassium 3.5 - 5.2 mmol/L 4.0 2.8(L) 4.2  Chloride 96 - 106 mmol/L 104 102 105  CO2 20 - 29 mmol/L 25 24 27   Calcium 8.7 - 10.3 mg/dL 9.8 9.2 9.5  Total Protein 6.0 - 8.5 g/dL 6.7 6.8 7.0  Total Bilirubin 0.0 - 1.2 mg/dL 0.6 1.2 0.6  Alkaline Phos 39 - 117 IU/L 52 61 49  AST 0 - 40 IU/L 20 31 23   ALT 0 - 32 IU/L 15 20 13      RADIOGRAPHIC STUDIES: I have personally reviewed the radiological images as listed and agreed with the findings in the report. Dg Hand Complete Right  Result Date: 09/03/2017 CLINICAL DATA:  Fall 1 month ago. Right hand pain at 2nd MCP joint and 2nd metacarpal EXAM: RIGHT HAND - COMPLETE 3+ VIEW COMPARISON:  None. FINDINGS: There is no evidence of fracture or dislocation. There is no evidence of arthropathy or other focal bone abnormality. Soft tissues are unremarkable. IMPRESSION: Negative. Electronically Signed   By: Rolm Baptise M.D.   On: 09/03/2017 08:21    ASSESSMENT & PLAN:  67 year old female, without history of malignancy, presented with screening discovered MSH6 mutation.   1. MSH6 mutation carrier  -I reviewed her genetic test results. -we discussed that this is one of the common genetic mutations which causes Lynch syndrome. -patients with  MSH6 mutations carriers 71% lifetime risk of any lynch syndrome related cancer, with a 10-26% risk for colorectal cancer, 16-49% risk for endometrial cancer and a 1% of Ovarion cancer. Additionally she has a 15.4 % risk of developing breast cancer for the remainder of her life.   -I briefly discussed her small risk (0-3%) for cancers like gastric,  pancreatic and Urinary. She may consider upper endoscopy every 2~ 3 years for screening. She does not have family history of pancreatic cancer, and would not qualify for screening image. She can consider annual urine cytology test for GU cancer screening, this can be followed by her PCP. -Due to her strong family history of colon cancer, and MSH6 mutation, I strongly recommend her to continue screening colonoscopy, every one to two years, to detected earlier stage colon cancer re-cancer polyps. We also discussed varous other screening tools such as CT colonography and stool based test. She agrees to continue with screening colonoscopy, she is scheduled for July 2019. She will f/u with Dr. Carlean Purl closely   -I discussed she should watch for rectal bleeding and have her blood counts monitored 2-3 times a year. If her blood counts are low this could be a sign of bleeding caused by colon cancer and she should f/u with GI earlier.  -I also discussed the benefit of aspirin to reduce risk of colon cancer. The results from multiple clinical trials are still controversial, and it has not been the standard recommendation in the high-risk population like her. She already takes ASA.  We also discussed healthy diet to decrease risk of colon cancer, such as high-fiber, more vegetable, less red meat. -He also has slightly increased risk of breast cancer.  I discussed the importance of annual mammograms, physical exam with a physician and self exam regularly. I do not think he risk is high enough for her to take anti-estrogen therapy.  -Pt underwent a  hysterectomy and BSO in 1999 so she is no longer at risk for endometrial/ovarion cancer.  -I discussed the importance of a healthy diet and regular exercise in cancer prevention. She agrees -F/u open, she is compliant with her screening with Dr. Carlean Purl.   PLAN: -F/u open, she is compliant with her screening with Dr. Carlean Purl for colonoscopy every 1-2 years -She will  continue to f/u with PCP  All questions were answered. The patient knows to call the clinic with any problems, questions or concerns. I spent 30 minutes counseling the patient face to face. The total time spent in the appointment was 40 minutes and more than 50% was on counseling.   This document serves as a record of services personally performed by Truitt Merle, MD. It was created on her behalf by Theresia Bough, a trained medical scribe. The creation of this record is based on the scribe's personal observations and the provider's statements to them.   I have reviewed the above documentation for accuracy and completeness, and I agree with the above.    Truitt Merle, MD 09/13/17 3:43 PM

## 2017-09-13 ENCOUNTER — Inpatient Hospital Stay: Payer: PPO | Attending: Oncology | Admitting: Hematology

## 2017-09-13 ENCOUNTER — Encounter: Payer: Self-pay | Admitting: Hematology

## 2017-09-13 ENCOUNTER — Encounter: Payer: PPO | Admitting: Oncology

## 2017-09-13 DIAGNOSIS — Z1509 Genetic susceptibility to other malignant neoplasm: Secondary | ICD-10-CM | POA: Diagnosis not present

## 2017-09-13 DIAGNOSIS — Z800 Family history of malignant neoplasm of digestive organs: Secondary | ICD-10-CM | POA: Insufficient documentation

## 2017-09-13 DIAGNOSIS — Z803 Family history of malignant neoplasm of breast: Secondary | ICD-10-CM | POA: Diagnosis not present

## 2017-09-14 ENCOUNTER — Encounter: Payer: Self-pay | Admitting: Hematology

## 2017-09-14 DIAGNOSIS — Z1509 Genetic susceptibility to other malignant neoplasm: Secondary | ICD-10-CM | POA: Insufficient documentation

## 2017-09-27 ENCOUNTER — Other Ambulatory Visit: Payer: Self-pay | Admitting: Emergency Medicine

## 2017-09-27 MED ORDER — ALOSETRON HCL 0.5 MG PO TABS: 1.0000 mg | ORAL_TABLET | Freq: Two times a day (BID) | ORAL | 2 refills | Status: DC

## 2017-10-01 ENCOUNTER — Ambulatory Visit (AMBULATORY_SURGERY_CENTER): Payer: Self-pay

## 2017-10-01 VITALS — Ht 68.0 in | Wt 155.0 lb

## 2017-10-01 DIAGNOSIS — Z1509 Genetic susceptibility to other malignant neoplasm: Secondary | ICD-10-CM

## 2017-10-01 DIAGNOSIS — Z800 Family history of malignant neoplasm of digestive organs: Secondary | ICD-10-CM

## 2017-10-01 NOTE — Progress Notes (Signed)
Per pt, no allergies to soy or egg products.Pt not taking any weight loss meds or using  O2 at home.  Pt refused emmi video. 

## 2017-10-10 ENCOUNTER — Other Ambulatory Visit: Payer: Self-pay | Admitting: Medical

## 2017-10-11 NOTE — Telephone Encounter (Signed)
Patient states that she only takes this when she needs it and has wondered if she can get off of it.  She read that it makes you sluggish and she is on other stuff from GI.  So whatever you think she is fine with.

## 2017-10-11 NOTE — Telephone Encounter (Signed)
Please call and see if she is still taking this medication.  I do believe she is taking this regularly.  She has a gastro specialist and I am not sure if they still want her on this or not.

## 2017-10-11 NOTE — Telephone Encounter (Signed)
Is this ok refill?  

## 2017-10-17 ENCOUNTER — Other Ambulatory Visit: Payer: Self-pay | Admitting: Medical

## 2017-10-26 ENCOUNTER — Encounter: Payer: Self-pay | Admitting: Internal Medicine

## 2017-10-28 ENCOUNTER — Encounter: Payer: PPO | Admitting: Internal Medicine

## 2017-11-08 HISTORY — PX: ESOPHAGOGASTRODUODENOSCOPY: SHX1529

## 2017-11-09 ENCOUNTER — Ambulatory Visit (AMBULATORY_SURGERY_CENTER): Payer: PPO | Admitting: Internal Medicine

## 2017-11-09 ENCOUNTER — Other Ambulatory Visit: Payer: Self-pay

## 2017-11-09 ENCOUNTER — Encounter: Payer: Self-pay | Admitting: Internal Medicine

## 2017-11-09 VITALS — BP 149/71 | HR 47 | Temp 98.6°F | Resp 12 | Ht 68.0 in | Wt 155.0 lb

## 2017-11-09 DIAGNOSIS — Z800 Family history of malignant neoplasm of digestive organs: Secondary | ICD-10-CM | POA: Diagnosis not present

## 2017-11-09 DIAGNOSIS — Z1509 Genetic susceptibility to other malignant neoplasm: Secondary | ICD-10-CM

## 2017-11-09 DIAGNOSIS — J45909 Unspecified asthma, uncomplicated: Secondary | ICD-10-CM | POA: Diagnosis not present

## 2017-11-09 DIAGNOSIS — I10 Essential (primary) hypertension: Secondary | ICD-10-CM | POA: Diagnosis not present

## 2017-11-09 DIAGNOSIS — D122 Benign neoplasm of ascending colon: Secondary | ICD-10-CM | POA: Diagnosis not present

## 2017-11-09 DIAGNOSIS — Z1211 Encounter for screening for malignant neoplasm of colon: Secondary | ICD-10-CM

## 2017-11-09 MED ORDER — SODIUM CHLORIDE 0.9 % IV SOLN: 500.0000 mL | Freq: Once | INTRAVENOUS | Status: DC

## 2017-11-09 NOTE — Op Note (Signed)
[place] Patient Name: Shelby Randolph Procedure Date: 11/09/2017 10:45 AM MRN: [id number] Endoscopist: Gatha Mayer , MD Age: 67 Referring MD:  Date of Birth: Apr 02, 1951 Gender: Female Account #: [id number] Procedure:                Upper GI endoscopy Indications:              Hereditary nonpolyposis colorectal cancer (Lynch                            Syndrome) Medicines:                Propofol per Anesthesia, Monitored Anesthesia Care Procedure:                After obtaining informed consent, the endoscope was                            passed under direct vision. Throughout the                            procedure, the patient's blood pressure, pulse, and                            oxygen saturations were monitored continuously. The                            Model [id number]-[id number]) scope was introduced                            through the mouth, and advanced to the second part                            of duodenum. The upper GI endoscopy was                            accomplished without difficulty. The patient                            tolerated the procedure well. Scope In: Scope Out: Findings:                 The esophagus was normal.                           The stomach was normal.                           The examined duodenum was normal. Complications:            No immediate complications. Estimated Blood Loss:     Estimated blood loss: none. Impression:               - Normal esophagus.                           - Normal stomach.                           - Normal examined duodenum.                           -  No specimens collected. Recommendation:           - See the other procedure note for documentation of                            additional recommendations.                           - Repeat upper endoscopy in 2 years for screening                            purposes. Gatha Mayer, MD 11/09/2017 11:30:16 AM This report has been  signed electronically.

## 2017-11-09 NOTE — Op Note (Signed)
[place] Patient Name: Shelby Randolph Procedure Date: 11/09/2017 10:44 AM MRN: [id number] Endoscopist: Gatha Mayer , MD Age: 67 Referring MD:  Date of Birth: 03/24/1951 Gender: Female Account #: [id number] Procedure:                Colonoscopy Indications:              Lynch Syndrome Medicines:                Propofol per Anesthesia, Monitored Anesthesia Care Procedure:                Pre-Anesthesia Assessment:                           - Prior to the procedure, a History and Physical                            was performed, and patient medications and                            allergies were reviewed. The patient's tolerance of                            previous anesthesia was also reviewed. The risks                            and benefits of the procedure and the sedation                            options and risks were discussed with the patient.                            All questions were answered, and informed consent                            was obtained. Prior Anticoagulants: The patient has                            taken no previous anticoagulant or antiplatelet                            agents. ASA Grade Assessment: II - A patient with                            mild systemic disease. After reviewing the risks                            and benefits, the patient was deemed in                            satisfactory condition to undergo the procedure.                           After obtaining informed consent, the colonoscope  was passed under direct vision. Throughout the                            procedure, the patient's blood pressure, pulse, and                            oxygen saturations were monitored continuously. The                            Model [id number]-[id number]) scope was introduced                            through the anus and advanced to the the cecum,                            identified by  appendiceal orifice and ileocecal                            valve. The colonoscopy was performed without                            difficulty. The patient tolerated the procedure                            well. The quality of the bowel preparation was                            adequate. The bowel preparation used was Miralax.                            The ileocecal valve, appendiceal orifice, and                            rectum were photographed. Scope In: 11:04:44 AM Scope Out: 11:21:34 AM Scope Withdrawal Time: 0 hours 13 minutes 46 seconds  Total Procedure Duration: 0 hours 16 minutes 50 seconds  Findings:                 The perianal and digital rectal examinations were                            normal.                           A diminutive polyp was found in the ascending                            colon. The polyp was sessile. The polyp was removed                            with a cold snare. Resection and retrieval were                            complete. Verification of patient identification  for the specimen was done. Estimated blood loss was                            minimal.                           Multiple diverticula were found in the sigmoid                            colon.                           The exam was otherwise without abnormality on                            direct and retroflexion views. Complications:            No immediate complications. Estimated Blood Loss:     Estimated blood loss was minimal. Impression:               - One diminutive polyp in the ascending colon,                            removed with a cold snare. Resected and retrieved.                           - Diverticulosis in the sigmoid colon.                           - The examination was otherwise normal on direct                            and retroflexion views. Recommendation:           - Patient has a contact number available for                             emergencies. The signs and symptoms of potential                            delayed complications were discussed with the                            patient. Return to normal activities tomorrow.                            Written discharge instructions were provided to the                            patient.                           - Resume previous diet.                           - Continue present medications.                           -  Repeat colonoscopy in 1 year for screening                            purposes and for surveillance. [place] 6 Lynch Syndrome                            + - need to use extra vs different prep Gatha Mayer, MD 11/09/2017 11:32:37 AM This report has been signed electronically.

## 2017-11-09 NOTE — Patient Instructions (Addendum)
The upper endoscopy is normal - we will plan to repeat that in about 2 years.  I found one polyp in the colon - tiny and benign - I will prove it. Due to the Lynch syndrome you get another colonoscopy in 1 year.  Based upon my review and knowledge your children should be tested to see if they have the genetic mutation - they might not as you have only one copy (heterozygote) so they had a 50/50 chance of getting the gene not 100%.  I appreciate the opportunity to care for you. Gatha Mayer, MD, [facility]  Handouts:  Diverticulosis  YOU HAD AN ENDOSCOPIC PROCEDURE TODAY AT [place]:   Refer to the procedure report that was given to you for any specific questions about what was found during the examination.  If the procedure report does not answer your questions, please call your gastroenterologist to clarify.  If you requested that your care partner not be given the details of your procedure findings, then the procedure report has been included in a sealed envelope for you to review at your convenience later.  YOU SHOULD EXPECT: Some feelings of bloating in the abdomen. Passage of more gas than usual.  Walking can help get rid of the air that was put into your GI tract during the procedure and reduce the bloating. If you had a lower endoscopy (such as a colonoscopy or flexible sigmoidoscopy) you may notice spotting of blood in your stool or on the toilet paper. If you underwent a bowel prep for your procedure, you may not have a normal bowel movement for a few days.  Please Note:  You might notice some irritation and congestion in your nose or some drainage.  This is from the oxygen used during your procedure.  There is no need for concern and it should clear up in a day or so.  SYMPTOMS TO REPORT IMMEDIATELY:   Following lower endoscopy (colonoscopy or flexible sigmoidoscopy):  Excessive amounts of blood in the stool  Significant tenderness or worsening of abdominal  pains  Swelling of the abdomen that is new, acute  Fever of 100F or higher   For urgent or emergent issues, a gastroenterologist can be reached at any hour by calling [phone].   DIET:  We do recommend a small meal at first, but then you may proceed to your regular diet.  Drink plenty of fluids but you should avoid alcoholic beverages for 24 hours.  ACTIVITY:  You should plan to take it easy for the rest of today and you should NOT DRIVE or use heavy machinery until tomorrow (because of the sedation medicines used during the test).    FOLLOW UP: Our staff will call the number listed on your records the next business day following your procedure to check on you and address any questions or concerns that you may have regarding the information given to you following your procedure. If we do not reach you, we will leave a message.  However, if you are feeling well and you are not experiencing any problems, there is no need to return our call.  We will assume that you have returned to your regular daily activities without incident.  If any biopsies were taken you will be contacted by phone or by letter within the next 1-3 weeks.  Please call us at [phone] if you have not heard about the biopsies in 3 weeks.    SIGNATURES/CONFIDENTIALITY: You and/or your care  partner have signed paperwork which will be entered into your electronic medical record.  These signatures attest to the fact that that the information above on your After Visit Summary has been reviewed and is understood.  Full responsibility of the confidentiality of this discharge information lies with you and/or your care-partner.

## 2017-11-09 NOTE — Progress Notes (Signed)
Called to room to assist during endoscopic procedure.  Patient ID and intended procedure confirmed with present staff. Received instructions for my participation in the procedure from the performing physician.  

## 2017-11-09 NOTE — Progress Notes (Signed)
Report to PACU, RN, vss, BBS= Clear.  

## 2017-11-10 ENCOUNTER — Telehealth: Payer: Self-pay

## 2017-11-10 NOTE — Telephone Encounter (Signed)
  Follow up Call-  Call back number 11/09/2017 04/18/2015  Post procedure Call Back phone  # [phone]  Permission to leave phone message Yes Yes  Some recent data might be hidden     Patient questions:  Do you have a fever, pain , or abdominal swelling? No. Pain Score  0 *  Have you tolerated food without any problems? Yes.    Have you been able to return to your normal activities? Yes.    Do you have any questions about your discharge instructions: Diet   No. Medications  No. Follow up visit  No.  Do you have questions or concerns about your Care? No.  Actions: * If pain score is 4 or above: No action needed, pain <4.

## 2017-11-15 ENCOUNTER — Encounter: Payer: Self-pay | Admitting: Internal Medicine

## 2017-11-15 NOTE — Progress Notes (Signed)
3 adenomas Recall colonoscopy 1 year (Lynch Syndrome) My Chart

## 2017-12-10 DIAGNOSIS — H524 Presbyopia: Secondary | ICD-10-CM | POA: Diagnosis not present

## 2017-12-10 DIAGNOSIS — H53002 Unspecified amblyopia, left eye: Secondary | ICD-10-CM | POA: Diagnosis not present

## 2017-12-10 DIAGNOSIS — H5203 Hypermetropia, bilateral: Secondary | ICD-10-CM | POA: Diagnosis not present

## 2017-12-13 ENCOUNTER — Other Ambulatory Visit: Payer: Self-pay | Admitting: Internal Medicine

## 2017-12-13 NOTE — Telephone Encounter (Signed)
How many refills [name]? 

## 2017-12-15 NOTE — Telephone Encounter (Signed)
May I refill ? 

## 2017-12-16 NOTE — Telephone Encounter (Signed)
Dr Carlean Purl refilled.

## 2018-01-17 DIAGNOSIS — L820 Inflamed seborrheic keratosis: Secondary | ICD-10-CM | POA: Diagnosis not present

## 2018-01-20 ENCOUNTER — Other Ambulatory Visit: Payer: Self-pay | Admitting: Medical

## 2018-01-20 NOTE — Telephone Encounter (Signed)
Is this ok to refill?  

## 2018-01-24 ENCOUNTER — Ambulatory Visit (INDEPENDENT_AMBULATORY_CARE_PROVIDER_SITE_OTHER): Payer: PPO | Admitting: Medical

## 2018-01-24 ENCOUNTER — Encounter: Payer: Self-pay | Admitting: Medical

## 2018-01-24 VITALS — BP 132/84 | HR 55 | Temp 98.0°F | Resp 16 | Ht 68.0 in | Wt 155.6 lb

## 2018-01-24 DIAGNOSIS — R252 Cramp and spasm: Secondary | ICD-10-CM | POA: Diagnosis not present

## 2018-01-24 DIAGNOSIS — I8393 Asymptomatic varicose veins of bilateral lower extremities: Secondary | ICD-10-CM | POA: Diagnosis not present

## 2018-01-24 DIAGNOSIS — R29898 Other symptoms and signs involving the musculoskeletal system: Secondary | ICD-10-CM | POA: Diagnosis not present

## 2018-01-24 NOTE — Progress Notes (Signed)
Subjective: Chief Complaint  Patient presents with  . leg cramps    leg pain at night,   Here for leg cramps.  Recently traveled up to [place], had a good long 4 hour hike.   But was having cramps prior to this.   calve cramping at night, contracting of toes.   Good about water intake  Compliant with HCTZ diuretic.   Takes multivitamin but no specific potassium.   Had issue last year with low potassium.    Has varicose veins that have worsened, but has had saphenous vein surgery in the past.  No other aggravating or relieving factors. No other complaint.   Past Medical History:  Diagnosis Date  . Adenomatous colon polyp   . Allergy   . Anemia   . Anxiety   . Asthma   . Atherosclerosis of aorta ([place]) 03/2014   abnormal on ultrasound, repeat US within 5 years  . Depression   . Diverticulosis   . Eye injury    in college, some pertinent loss of vision in left eye  . Genital herpes   . GERD (gastroesophageal reflux disease)   . History of bone density study 09/2011   normal  . History of mammogram    Dr. Radene Knee  . History of melanoma    sees [facility], dermatology  . History of PFTs 01/20/2011   moderate airflow limitation, no significant response to bronchodilator  . Hyperlipidemia   . Hypertension 12/2011  . Insomnia   . Migraines    Dr. Melton Alar, migraines  . MSH6-related Lynch syndrome ([facility]) 08/20/2017  . Nearsightedness    wears glasses  . Post-operative nausea and vomiting   . Routine gynecological examination    Dr. Radene Knee  . Zika virus disease 06/2014   Current Outpatient Medications on File Prior to Visit  Medication Sig Dispense Refill  . alosetron (LOTRONEX) 0.5 MG tablet Take 2 tablets (1 mg total) by mouth 2 (two) times daily. (Patient taking differently: Take by mouth. Take 1 pill daily) 60 tablet 2  . aspirin EC 81 MG tablet Take 81 mg by mouth daily.    Marland Kitchen atenolol (TENORMIN) 25 MG tablet TAKE ONE TABLET EACH DAY 90 tablet 3  . atorvastatin (LIPITOR) 20 MG  tablet Take 1 tablet (20 mg total) by mouth daily. 90 tablet 3  . cetirizine (ZYRTEC) 10 MG tablet Take 10 mg by mouth daily.    . cholecalciferol (VITAMIN D) 1000 units tablet Take 1 tablet (1,000 Units total) by mouth daily. 90 tablet 3  . hydrochlorothiazide (MICROZIDE) 12.5 MG capsule TAKE ONE CAPSULE EACH DAY 90 capsule 3  . Multiple Vitamin (MULTIVITAMIN) capsule Take 1 capsule by mouth daily.    . ondansetron (ZOFRAN) 4 MG tablet TAKE ONE TABLET EVERY EIGHT HOURS AS NEEDED FOR NAUSEA AND VOMITING (AND TO PREVENT DIARRHEA-MAY USE 2 TABLETS ) 30 tablet 0  . Probiotic Product (PRO-BIOTIC BLEND PO) Take 1 tablet by mouth daily.    Marland Kitchen zolpidem (AMBIEN) 10 MG tablet TAKE 1/2 TO 1 TABLET AT BEDTIME 90 tablet 0  . acetaminophen (TYLENOL) 500 MG tablet 1-2 tablets twice to three times daily as needed. 100 tablet 5  . albuterol (PROVENTIL HFA;VENTOLIN HFA) 108 (90 Base) MCG/ACT inhaler Inhale 2 puffs into the lungs as needed. (Patient not taking: Reported on 01/24/2018) 1 Inhaler 1  . metoCLOPramide (REGLAN) 5 MG tablet TAKE ONE TABLET TWICE DAILY (Patient not taking: Reported on 01/24/2018) 180 tablet 0  . triamcinolone cream (KENALOG) 0.1 %  Apply 1 application topically 2 (two) times daily. (Patient not taking: Reported on 09/02/2017) 45 g 0   Current Facility-Administered Medications on File Prior to Visit  Medication Dose Route Frequency Provider Last Rate Last Dose  . 0.9 %  sodium chloride infusion  500 mL Intravenous Once Gatha Mayer, MD      . 0.9 %  sodium chloride infusion  500 mL Intravenous Once Gatha Mayer, MD      . albuterol (PROVENTIL) (5 MG/ML) 0.5% nebulizer solution 2.5 mg  2.5 mg Nebulization Once Rita Ohara, MD       ROS as in subjective    Objective: BP 132/84   Pulse (!) 55   Temp 98 F (36.7 C) (Oral)   Resp 16   Ht 5' 8"  (1.727 m)   Wt 155 lb 9.6 oz (70.6 kg)   SpO2 97%   BMI 23.66 kg/m   Wt Readings from Last 3 Encounters:  01/24/18 155 lb 9.6 oz (70.6  kg)  11/09/17 155 lb (70.3 kg)  10/01/17 155 lb (70.3 kg)   Gen: wd, wn, nad Lower legs with moderate varicosities, worse on left.    Legs nontender, no asymmetry, no atrophy, normal legs ROM Pulses 2+ LE Legs with normal DTRs, strength and sensation Heart rrr, normal s1, s2, no murmurs Ext: no edema    Assessment: Encounter Diagnoses  Name Primary?  . Leg cramping Yes  . Varicose veins of both lower extremities, unspecified whether complicated   . Weakness of both legs      Plan: Labs today, c/t good hydration, regular exercise.  Gets flu shot at work and had 1st shingrix recently at [employer] was seen today for leg cramps.  Diagnoses and all orders for this visit:  Leg cramping -     Comprehensive metabolic panel -     CK -     CBC  Varicose veins of both lower extremities, unspecified whether complicated -     Comprehensive metabolic panel -     CK -     CBC  Weakness of both legs -     Comprehensive metabolic panel -     CK -     CBC

## 2018-01-25 LAB — COMPREHENSIVE METABOLIC PANEL
A/G RATIO: 2.5 — AB (ref 1.2–2.2)
ALK PHOS: 55 IU/L (ref 39–117)
ALT: 17 IU/L (ref 0–32)
AST: 29 IU/L (ref 0–40)
Albumin: 4.9 g/dL — ABNORMAL HIGH (ref 3.6–4.8)
BUN/Creatinine Ratio: 21 (ref 12–28)
BUN: 18 mg/dL (ref 8–27)
Bilirubin Total: 0.6 mg/dL (ref 0.0–1.2)
CALCIUM: 9.9 mg/dL (ref 8.7–10.3)
CO2: 23 mmol/L (ref 20–29)
Chloride: 103 mmol/L (ref 96–106)
Creatinine, Ser: 0.87 mg/dL (ref 0.57–1.00)
GFR calc Af Amer: 80 mL/min/{1.73_m2} (ref >59)
GFR, EST NON AFRICAN AMERICAN: 69 mL/min/{1.73_m2} (ref >59)
Globulin, Total: 2 g/dL (ref 1.5–4.5)
Glucose: 94 mg/dL (ref 65–99)
POTASSIUM: 4.4 mmol/L (ref 3.5–5.2)
SODIUM: 145 mmol/L — AB (ref 134–144)
Total Protein: 6.9 g/dL (ref 6.0–8.5)

## 2018-01-25 LAB — CBC
Hematocrit: 38.5 % (ref 34.0–46.6)
Hemoglobin: 13 g/dL (ref 11.1–15.9)
MCH: 31.3 pg (ref 26.6–33.0)
MCHC: 33.8 g/dL (ref 31.5–35.7)
MCV: 93 fL (ref 79–97)
Platelets: 164 10*3/uL (ref 150–450)
RBC: 4.16 x10E6/uL (ref 3.77–5.28)
RDW: 13.3 % (ref 12.3–15.4)
WBC: 4.6 10*3/uL (ref 3.4–10.8)

## 2018-01-25 LAB — CK: CK TOTAL: 121 U/L (ref 24–173)

## 2018-01-27 DIAGNOSIS — Z23 Encounter for immunization: Secondary | ICD-10-CM | POA: Diagnosis not present

## 2018-01-28 LAB — SPECIMEN STATUS REPORT

## 2018-01-28 LAB — MAGNESIUM: MAGNESIUM: 2.1 mg/dL (ref 1.6–2.3)

## 2018-03-13 ENCOUNTER — Other Ambulatory Visit: Payer: Self-pay | Admitting: Internal Medicine

## 2018-04-02 ENCOUNTER — Other Ambulatory Visit: Payer: Self-pay | Admitting: Physician Assistant

## 2018-04-05 ENCOUNTER — Telehealth: Payer: Self-pay | Admitting: *Deleted

## 2018-04-05 NOTE — Telephone Encounter (Signed)
Per Ellouise Newer PA, OK to refill the Lotronex 0.5 mg with reills until January 2020.

## 2018-04-17 ENCOUNTER — Other Ambulatory Visit: Payer: Self-pay | Admitting: Medical

## 2018-04-18 NOTE — Telephone Encounter (Signed)
Is this ok to refill?  

## 2018-05-11 DIAGNOSIS — M8580 Other specified disorders of bone density and structure, unspecified site: Secondary | ICD-10-CM

## 2018-05-11 HISTORY — DX: Other specified disorders of bone density and structure, unspecified site: M85.80

## 2018-06-13 DIAGNOSIS — M8588 Other specified disorders of bone density and structure, other site: Secondary | ICD-10-CM | POA: Diagnosis not present

## 2018-06-13 DIAGNOSIS — N958 Other specified menopausal and perimenopausal disorders: Secondary | ICD-10-CM | POA: Diagnosis not present

## 2018-06-13 DIAGNOSIS — Z01419 Encounter for gynecological examination (general) (routine) without abnormal findings: Secondary | ICD-10-CM | POA: Diagnosis not present

## 2018-06-13 DIAGNOSIS — Z1231 Encounter for screening mammogram for malignant neoplasm of breast: Secondary | ICD-10-CM | POA: Diagnosis not present

## 2018-06-13 DIAGNOSIS — Z6826 Body mass index (BMI) 26.0-26.9, adult: Secondary | ICD-10-CM | POA: Diagnosis not present

## 2018-06-13 LAB — HM DEXA SCAN

## 2018-06-22 DIAGNOSIS — M8580 Other specified disorders of bone density and structure, unspecified site: Secondary | ICD-10-CM | POA: Diagnosis not present

## 2018-07-01 ENCOUNTER — Other Ambulatory Visit: Payer: Self-pay | Admitting: Internal Medicine

## 2018-07-01 NOTE — Telephone Encounter (Signed)
Refill x 3 ok

## 2018-07-01 NOTE — Telephone Encounter (Signed)
Ok to refill Dr. Carlean Purl?

## 2018-07-07 ENCOUNTER — Encounter: Payer: Self-pay | Admitting: Medical

## 2018-07-07 ENCOUNTER — Ambulatory Visit (INDEPENDENT_AMBULATORY_CARE_PROVIDER_SITE_OTHER): Payer: PPO | Admitting: Medical

## 2018-07-07 VITALS — BP 100/60 | HR 58 | Temp 98.0°F | Resp 16 | Ht 66.5 in | Wt 162.0 lb

## 2018-07-07 DIAGNOSIS — Z23 Encounter for immunization: Secondary | ICD-10-CM | POA: Diagnosis not present

## 2018-07-07 DIAGNOSIS — I10 Essential (primary) hypertension: Secondary | ICD-10-CM | POA: Diagnosis not present

## 2018-07-07 DIAGNOSIS — G4700 Insomnia, unspecified: Secondary | ICD-10-CM

## 2018-07-07 DIAGNOSIS — Z780 Asymptomatic menopausal state: Secondary | ICD-10-CM

## 2018-07-07 DIAGNOSIS — M8580 Other specified disorders of bone density and structure, unspecified site: Secondary | ICD-10-CM

## 2018-07-07 DIAGNOSIS — J4520 Mild intermittent asthma, uncomplicated: Secondary | ICD-10-CM | POA: Diagnosis not present

## 2018-07-07 DIAGNOSIS — R110 Nausea: Secondary | ICD-10-CM

## 2018-07-07 DIAGNOSIS — J309 Allergic rhinitis, unspecified: Secondary | ICD-10-CM

## 2018-07-07 DIAGNOSIS — K8020 Calculus of gallbladder without cholecystitis without obstruction: Secondary | ICD-10-CM

## 2018-07-07 DIAGNOSIS — E559 Vitamin D deficiency, unspecified: Secondary | ICD-10-CM | POA: Diagnosis not present

## 2018-07-07 DIAGNOSIS — A6000 Herpesviral infection of urogenital system, unspecified: Secondary | ICD-10-CM | POA: Diagnosis not present

## 2018-07-07 DIAGNOSIS — K580 Irritable bowel syndrome with diarrhea: Secondary | ICD-10-CM

## 2018-07-07 DIAGNOSIS — E2839 Other primary ovarian failure: Secondary | ICD-10-CM | POA: Insufficient documentation

## 2018-07-07 DIAGNOSIS — Z1509 Genetic susceptibility to other malignant neoplasm: Secondary | ICD-10-CM

## 2018-07-07 DIAGNOSIS — Z86010 Personal history of colonic polyps: Secondary | ICD-10-CM

## 2018-07-07 DIAGNOSIS — F411 Generalized anxiety disorder: Secondary | ICD-10-CM | POA: Diagnosis not present

## 2018-07-07 DIAGNOSIS — Z0000 Encounter for general adult medical examination without abnormal findings: Secondary | ICD-10-CM | POA: Diagnosis not present

## 2018-07-07 DIAGNOSIS — E785 Hyperlipidemia, unspecified: Secondary | ICD-10-CM | POA: Diagnosis not present

## 2018-07-07 DIAGNOSIS — I700 Atherosclerosis of aorta: Secondary | ICD-10-CM | POA: Diagnosis not present

## 2018-07-07 DIAGNOSIS — Z7189 Other specified counseling: Secondary | ICD-10-CM | POA: Insufficient documentation

## 2018-07-07 DIAGNOSIS — Z7185 Encounter for immunization safety counseling: Secondary | ICD-10-CM

## 2018-07-07 DIAGNOSIS — L989 Disorder of the skin and subcutaneous tissue, unspecified: Secondary | ICD-10-CM

## 2018-07-07 LAB — POCT URINALYSIS DIP (PROADVANTAGE DEVICE)
Bilirubin, UA: NEGATIVE
Blood, UA: NEGATIVE
Glucose, UA: NEGATIVE mg/dL
Ketones, POC UA: NEGATIVE mg/dL
Leukocytes, UA: NEGATIVE
Nitrite, UA: NEGATIVE
Protein Ur, POC: NEGATIVE mg/dL
Specific Gravity, Urine: 1.02
Urobilinogen, Ur: NEGATIVE
pH, UA: 7 (ref 5.0–8.0)

## 2018-07-07 NOTE — Progress Notes (Signed)
Subjective:    Shelby Randolph is a 68 y.o. female who presents for Preventative Services visit and chronic medical problems/med check visit.    Primary Care Provider Lua Feng, Camelia Eng, PA-C here for primary care  Medical Services you may have received from other than [facility] providers in the past year (date may be approximate) gynecology  Exercise Current exercise habits:  Spin class 2 days per week, walking, uses trainer 2 days per week  Nutrition/Diet Current diet: healthy  Depression Screen Depression screen [facility] 2/9 07/07/2018  Decreased Interest 0  Down, Depressed, Hopeless 3  PHQ - 2 Score 3  Altered sleeping 0  Tired, decreased energy 0  Change in appetite 0  Feeling bad or failure about yourself  0  Trouble concentrating 1  Moving slowly or fidgety/restless 0  Suicidal thoughts 0  PHQ-9 Score 4    Activities of Daily Living Screen/Functional Status Survey Is the patient deaf or have difficulty hearing?: No Does the patient have difficulty seeing, even when wearing glasses/contacts?: Yes Does the patient have difficulty concentrating, remembering, or making decisions?: No Does the patient have difficulty walking or climbing stairs?: No Does the patient have difficulty dressing or bathing?: No Does the patient have difficulty doing errands alone such as visiting a doctor's office or shopping?: No  Can patient draw a clock face showing 3:15 o'clock, yes  Fall Risk Screen Fall Risk  07/07/2018 07/06/2017 06/25/2016 05/23/2015  Falls in the past year? 0 No Yes No  Number falls in past yr: - - 1 -  Injury with Fall? - - Yes -  Comment - - rt hip  -  Risk for fall due to : - - Other (Comment) -  Follow up Falls evaluation completed - - -    Gait Assessment: Normal gait observed yes   Past Medical History:  Diagnosis Date  . Adenomatous colon polyp   . Allergy   . Anemia   . Anxiety   . Asthma   . Atherosclerosis of aorta ([place]) 03/2014   abnormal on ultrasound,  repeat US within 5 years  . Depression   . Diverticulosis   . Eye injury    in college, some pertinent loss of vision in left eye  . Genital herpes   . GERD (gastroesophageal reflux disease)   . History of bone density study 09/2011   normal  . History of mammogram    Dr. Radene Knee  . History of melanoma    sees [facility], dermatology  . History of PFTs 01/20/2011   moderate airflow limitation, no significant response to bronchodilator  . Hyperlipidemia   . Hypertension 12/2011  . Insomnia   . Migraines    Dr. Melton Alar, migraines  . MSH6-related Lynch syndrome (HNPCC5) 08/20/2017  . Nearsightedness    wears glasses  . Post-operative nausea and vomiting   . Routine gynecological examination    Dr. Radene Knee  . Zika virus disease 06/2014    Past Surgical History:  Procedure Laterality Date  . COLONOSCOPY  2016   Dr. Silvano Rusk, 2014 Dr. Earlean Shawl  . ESOPHAGOGASTRODUODENOSCOPY    . ROTATOR CUFF REPAIR Right 05/2010  . TONSILLECTOMY AND ADENOIDECTOMY     as a kid  . TUBAL LIGATION  1985  . VAGINAL HYSTERECTOMY  1998   total; abnormal peroids  . VARICOSE VEIN SURGERY     bilat    Social History   Socioeconomic History  . Marital status: Widowed    Spouse name: Not on  file  . Number of children: 5  . Years of education: Not on file  . Highest education level: Not on file  Occupational History  . Occupation: compliance agent Investment banker, corporate)    Employer: [employer]    Comment: Nurse Practitioner  Social Needs  . Financial resource strain: Not on file  . Food insecurity:    Worry: Not on file    Inability: Not on file  . Transportation needs:    Medical: Not on file    Non-medical: Not on file  Tobacco Use  . Smoking status: Never Smoker  . Smokeless tobacco: Never Used  Substance and Sexual Activity  . Alcohol use: Yes    Alcohol/week: 2.0 standard drinks    Types: 2 Glasses of wine per week  . Drug use: No  . Sexual activity: Not on file    Comment: exercise -  walks, [place], cardio at gym 3 x/wk, yoga, widowed   Lifestyle  . Physical activity:    Days per week: Not on file    Minutes per session: Not on file  . Stress: Not on file  Relationships  . Social connections:    Talks on phone: Not on file    Gets together: Not on file    Attends religious service: Not on file    Active member of club or organization: Not on file    Attends meetings of clubs or organizations: Not on file    Relationship status: Not on file  . Intimate partner violence:    Fear of current or ex partner: Not on file    Emotionally abused: Not on file    Physically abused: Not on file    Forced sexual activity: Not on file  Other Topics Concern  . 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 COPD Mother        died age 6yo.  emphysema  . Colon cancer Father 49        died age 15yo  . Colon cancer Brother 64  . Breast cancer Maternal Aunt   . Colon cancer Paternal Uncle        2  . Breast cancer Daughter   . Colon cancer Paternal Grandmother 6       colon cancer   . Diabetes Neg Hx   . Heart disease Neg Hx   . Hypertension Neg Hx   . Stroke Neg Hx      Current Outpatient Medications:  .  acetaminophen (TYLENOL) 500 MG tablet, 1-2 tablets twice to three times daily as needed., Disp: 100 tablet, Rfl: 5 .  alosetron (LOTRONEX) 0.5 MG tablet, TAKE TWO TABLETS TWICE DAILY, Disp: 60 tablet, Rfl: 2 .  aspirin EC 81 MG tablet, Take 81 mg by mouth daily., Disp: , Rfl:  .  atenolol (TENORMIN) 25 MG tablet, TAKE ONE TABLET EACH DAY, Disp: 90 tablet, Rfl: 3 .  atorvastatin (LIPITOR) 20 MG tablet, Take 1 tablet (20 mg total) by mouth daily., Disp: 90  tablet, Rfl: 3 .  cetirizine (ZYRTEC) 10 MG tablet, Take 10 mg by mouth daily., Disp: , Rfl:  .  cholecalciferol (VITAMIN D) 1000 units tablet, Take  1 tablet (1,000 Units total) by mouth daily., Disp: 90 tablet, Rfl: 3 .  hydrochlorothiazide (MICROZIDE) 12.5 MG capsule, TAKE ONE CAPSULE EACH DAY, Disp: 90 capsule, Rfl: 3 .  Multiple Vitamin (MULTIVITAMIN) capsule, Take 1 capsule by mouth daily., Disp: , Rfl:  .  Probiotic Product (PRO-BIOTIC BLEND PO), Take 1 tablet by mouth daily., Disp: , Rfl:  .  zolpidem (AMBIEN) 10 MG tablet, TAKE 1/2 TO 1 TABLET AT BEDTIME, Disp: 90 tablet, Rfl: 1 .  albuterol (PROVENTIL HFA;VENTOLIN HFA) 108 (90 Base) MCG/ACT inhaler, Inhale 2 puffs into the lungs as needed. (Patient not taking: Reported on 07/07/2018), Disp: 1 Inhaler, Rfl: 1 .  metoCLOPramide (REGLAN) 5 MG tablet, TAKE ONE TABLET TWICE DAILY (Patient not taking: Reported on 01/24/2018), Disp: 180 tablet, Rfl: 0 .  ondansetron (ZOFRAN) 4 MG tablet, TAKE ONE TABLET EVERY EIGHT HOURS AS NEEDED FOR NAUSEA AND VOMITING (AND TO PREVENT DIARRHEA-MAY USE 2 TABLETS ) (Patient not taking: Reported on 07/07/2018), Disp: 30 tablet, Rfl: 0 .  triamcinolone cream (KENALOG) 0.1 %, Apply 1 application topically 2 (two) times daily. (Patient not taking: Reported on 09/02/2017), Disp: 45 g, Rfl: 0  Current Facility-Administered Medications:  .  0.9 %  sodium chloride infusion, 500 mL, Intravenous, Once, Gatha Mayer, MD .  0.9 %  sodium chloride infusion, 500 mL, Intravenous, Once, Gatha Mayer, MD .  albuterol (PROVENTIL) (5 MG/ML) 0.5% nebulizer solution 2.5 mg, 2.5 mg, Nebulization, Once, Rita Ohara, MD  Allergies  Allergen Reactions  . Sulfonamide Derivatives Shortness Of Breath and Swelling  . Mucinex [Guaifenesin Er] Other (See Comments)    "Jittery and hypes me up"    History reviewed: allergies, current medications, past family history, past medical history, past social history, past surgical history and  problem list  Chronic issues discussed: Reviewed chronic health issues, medications  Acute issues discussed: Worse anxiety of late, stress at work, thinks she may need to start medication   Objective:      Biometrics BP 100/60   Pulse (!) 58   Temp 98 F (36.7 C) (Oral)   Resp 16   Ht 5' 6.5" (1.689 m)   Wt 162 lb (73.5 kg)   SpO2 97%   BMI 25.76 kg/m   Cognitive Testing  Alert? Yes  Normal Appearance?Yes  Oriented to person? Yes  Place? Yes   Time? Yes  Recall of three objects?  Yes  Can perform simple calculations? Yes  Displays appropriate judgment?Yes  Can read the correct time from a watch face?Yes   Gen: wd, wn, nad, white female HEENT: normocephalic, sclerae anicteric, TMs pearly, nares patent, no discharge or erythema, pharynx normal Oral cavity: MMM, no lesions Neck: supple, no lymphadenopathy, no thyromegaly, no masses Heart: RRR, normal S1, S2, no murmurs Lungs: CTA bilaterally, no wheezes, rhonchi, or rales Abdomen: +bs, soft, non tender, non distended, no masses, no hepatomegaly, no splenomegaly Musculoskeletal: nontender, no swelling, no obvious deformity Extremities: no edema, no cyanosis, no clubbing Pulses: 1+ symmetric, upper and lower extremities, normal cap refill Neurological: alert, oriented x 3, CN2-12 intact, strength normal upper extremities and lower extremities, sensation normal throughout, DTRs 2+ throughout, no cerebellar signs, gait normal Psychiatric: normal affect, behavior normal, pleasant  Skin: scattered macules, freckles, crusted raised small roundish 2-4 mm diameter lesions scattered on bilat lower lateral legs, bilat forearms, upper back  EKG Indication hypertension, rate 51 bpm, interval for PR 178 ms, QRS duration 102 ms, QTC 429 ms, axis -40 9 degrees, sinus  bradycardia, incomplete right bundle branch block, left anterior fascicular block, nonspecific ST abnormality, compared to 2017 EKG, slight widening of QRS in precordial  leads  Assessment:   Encounter Diagnoses  Name Primary?  . Routine general medical examination at a health care facility   . Medicare annual wellness visit, subsequent   . Atherosclerosis of abdominal aorta ([place])   . Essential hypertension   . Mild intermittent asthma without complication   . Allergic rhinitis, unspecified seasonality, unspecified trigger   . Irritable bowel syndrome with diarrhea   . Gallstones   . Genital herpes simplex, unspecified site   . Chronic nausea   . Dyslipidemia   . Generalized anxiety disorder   . History of colonic polyps   . Insomnia, unspecified type   . Monoallelic mutation of MSH6 gene   . MSH6-related Lynch syndrome (HNPCC5)   . Vitamin D deficiency   . Post-menopausal   . Estrogen deficiency   . Vaccine counseling   . Need for pneumococcal vaccination   . Osteopenia, unspecified location   . Skin lesion   . Lynch syndrome Yes     Plan:   A preventative services visit was completed today.  During the course of the visit today, we discussed and counseled about appropriate screening and preventive services.  A health risk assessment was established today that included a review of current medications, allergies, social history, family history, medical and preventative health history, biometrics, and preventative screenings to identify potential safety concerns or impairments.  A personalized plan was printed today for your records and use.   Personalized health advice and education was given today to reduce health risks and promote self management and wellness.  Information regarding end of life planning was discussed today.  We will request shingles vaccine records from [facility] [address] We will request mammogram and [facility] that is up-to-date   Conditions/risks identified: anxiety  Recommendations:  I recommend a yearly ophthalmology/optometry visit for glaucoma screening and eye  checkup  I recommended a yearly dental visit for hygiene and checkup  Advanced directives - discussed nature and purpose of Advanced Directives, encouraged them to complete them if they have not done so and/or encouraged them to get [place] a copy if they have done this already.    Referrals today: Consider referrals for full PFTs and cardiac calcium score/stress test   Problems: Osteopenia- she will start Fosamax per gynecology visit recently, discussed weightbearing aerobic exercise  Hypertension-continue same medication, consider stress test baseline  Hyperlipidemia and aortic atherosclerosis-continue same medication, routine labs today  Vitamin D deficiency- labs today, continue current medications  Skin lesions-continue regular follow-up with dermatology  Continue regular follow-up with gynecology  Abnormal vision screen-advise she see her eye doctor  Asthma and allergies- continue current medications, advised up-to-date PFT full  Lynch syndrome, colon polyps tubular adenoma-reviewed 2019 colonoscopy done and she will be due for repeat colonoscopy this year in July  Immunizations: I recommended a yearly influenza vaccine, typically in September when the vaccine is usually available Counseled on the pneumococcal vaccine.  Vaccine information sheet given.  Pneumococcal vaccine PPSV23 given after consent obtained. She is up-to-date on Prevnar 13, Tdap We will request records from shingles vaccine she had last year  Jocelyn was seen today for awv.  Diagnoses and all orders for this visit:  Lynch syndrome  Routine general medical examination at a health care facility -     POCT Urinalysis DIP (Proadvantage Device) -  Comprehensive metabolic panel -     Lipid panel -     TSH -     VITAMIN D 25 Hydroxy (Vit-D Deficiency, Fractures) -     EKG 12-Lead  Medicare annual wellness visit, subsequent -     EKG 12-Lead  Atherosclerosis of abdominal aorta (HCC)  Essential  hypertension -     Comprehensive metabolic panel -     EKG 63-CATN  Mild intermittent asthma without complication -     Pulmonary Function Test; Future  Allergic rhinitis, unspecified seasonality, unspecified trigger -     Pulmonary Function Test; Future  Irritable bowel syndrome with diarrhea  Gallstones  Genital herpes simplex, unspecified site  Chronic nausea  Dyslipidemia -     Comprehensive metabolic panel -     Lipid panel  Generalized anxiety disorder -     TSH  History of colonic polyps  Insomnia, unspecified type  Monoallelic mutation of MSH6 gene  MSH6-related Lynch syndrome (HNPCC5)  Vitamin D deficiency -     VITAMIN D 25 Hydroxy (Vit-D Deficiency, Fractures)  Post-menopausal  Estrogen deficiency  Vaccine counseling  Need for pneumococcal vaccination  Osteopenia, unspecified location  Skin lesion     Medicare Attestation A preventative services visit was completed today.  During the course of the visit the patient was educated and counseled about appropriate screening and preventive services.  A health risk assessment was established with the patient that included a review of current medications, allergies, social history, family history, medical and preventative health history, biometrics, and preventative screenings to identify potential safety concerns or impairments.  A personalized plan was printed today for the patient's records and use.   Personalized health advice and education was given today to reduce health risks and promote self management and wellness.  Information regarding end of life planning was discussed today.  Dorothea Ogle, PA-C   07/07/2018

## 2018-07-07 NOTE — Patient Instructions (Signed)
Osteopenia- begin Fosamax per gynecology visit recently, continue weightbearing and aerobic exercise  Hypertension-continue same medication  Hyperlipidemia and aortic atherosclerosis-continue same medication, routine labs today  Vitamin D deficiency- labs today, continue current medications  Skin lesions-continue regular follow-up with dermatology  Continue regular follow-up with gynecology  Abnormal vision screen - see your eye doctor  Asthma and allergies- continue current medications, we will schedule you for PFT full at [facility] pulmonology  Lynch syndrome, colon polyps tubular adenoma-reviewed 2019 colonoscopy done and you will be due for repeat colonoscopy this year in July  See your dentist and eye doctor every year in general for routine care  I strongly recommend counseling.  We can consider a daily medication such as Wellbutrin or Paroxetine  To help with anxiety  At some point we should consider a baseline stress test of your heart if you have not done this in more than 3 to 5 years.  There are some EKG abnormalities not a lot different from 3 years ago.  I will check your insurance coverage for this and deductibles and if you want to pursue this we can schedule this   Counseling [facility] [address] [place], [place] [address] [phone]    [facility] [phone]  [email or web address] [address] [place], [place], [place] [address]  Rema Fendt, therapist  Or Toy Cookey, MA, clinical psychologist    Verneda Skill. Clarene Reamer, therapist [phone] [address] [place], [place] [address]   [facility] [phone] [address] [place], [place] [address]

## 2018-07-08 ENCOUNTER — Other Ambulatory Visit: Payer: Self-pay | Admitting: Medical

## 2018-07-08 LAB — COMPREHENSIVE METABOLIC PANEL
ALT: 15 IU/L (ref 0–32)
AST: 24 IU/L (ref 0–40)
Albumin/Globulin Ratio: 2.1 (ref 1.2–2.2)
Albumin: 4.5 g/dL (ref 3.8–4.8)
Alkaline Phosphatase: 52 IU/L (ref 39–117)
BUN/Creatinine Ratio: 23 (ref 12–28)
BUN: 21 mg/dL (ref 8–27)
Bilirubin Total: 0.5 mg/dL (ref 0.0–1.2)
CO2: 25 mmol/L (ref 20–29)
CREATININE: 0.9 mg/dL (ref 0.57–1.00)
Calcium: 9.5 mg/dL (ref 8.7–10.3)
Chloride: 103 mmol/L (ref 96–106)
GFR calc Af Amer: 76 mL/min/{1.73_m2} (ref >59)
GFR calc non Af Amer: 66 mL/min/{1.73_m2} (ref >59)
GLOBULIN, TOTAL: 2.1 g/dL (ref 1.5–4.5)
Glucose: 92 mg/dL (ref 65–99)
Potassium: 4.2 mmol/L (ref 3.5–5.2)
Sodium: 142 mmol/L (ref 134–144)
Total Protein: 6.6 g/dL (ref 6.0–8.5)

## 2018-07-08 LAB — LIPID PANEL
Chol/HDL Ratio: 2.2 ratio (ref 0.0–4.4)
Cholesterol, Total: 173 mg/dL (ref 100–199)
HDL: 77 mg/dL (ref >39)
LDL Calculated: 78 mg/dL (ref 0–99)
Triglycerides: 92 mg/dL (ref 0–149)
VLDL Cholesterol Cal: 18 mg/dL (ref 5–40)

## 2018-07-08 LAB — TSH: TSH: 1.7 u[IU]/mL (ref 0.450–4.500)

## 2018-07-08 LAB — VITAMIN D 25 HYDROXY (VIT D DEFICIENCY, FRACTURES): Vit D, 25-Hydroxy: 45.2 ng/mL (ref 30.0–100.0)

## 2018-07-08 MED ORDER — ATORVASTATIN CALCIUM 20 MG PO TABS: 20.0000 mg | ORAL_TABLET | Freq: Every day | ORAL | 3 refills | Status: DC

## 2018-07-08 MED ORDER — VITAMIN D 25 MCG (1000 UNIT) PO TABS: 1000.0000 [IU] | ORAL_TABLET | Freq: Every day | ORAL | 3 refills | Status: DC

## 2018-07-08 MED ORDER — ATENOLOL 25 MG PO TABS: ORAL_TABLET | ORAL | 3 refills | Status: DC

## 2018-07-08 MED ORDER — HYDROCHLOROTHIAZIDE 12.5 MG PO CAPS: ORAL_CAPSULE | ORAL | 3 refills | Status: DC

## 2018-07-08 MED ORDER — ASPIRIN EC 81 MG PO TBEC: 81.0000 mg | DELAYED_RELEASE_TABLET | Freq: Every day | ORAL | 3 refills | Status: DC

## 2018-07-13 ENCOUNTER — Telehealth: Payer: Self-pay | Admitting: Medical

## 2018-07-13 ENCOUNTER — Other Ambulatory Visit: Payer: Self-pay

## 2018-07-13 DIAGNOSIS — J329 Chronic sinusitis, unspecified: Secondary | ICD-10-CM

## 2018-07-13 DIAGNOSIS — I10 Essential (primary) hypertension: Secondary | ICD-10-CM

## 2018-07-13 DIAGNOSIS — R9431 Abnormal electrocardiogram [ECG] [EKG]: Secondary | ICD-10-CM

## 2018-07-13 DIAGNOSIS — J40 Bronchitis, not specified as acute or chronic: Secondary | ICD-10-CM

## 2018-07-13 NOTE — Telephone Encounter (Signed)
Referral put in Epic.   

## 2018-07-13 NOTE — Telephone Encounter (Signed)
Received requested records from [facility]. Sending back for review.

## 2018-07-13 NOTE — Telephone Encounter (Signed)
Please see her email.  She is calling to inquire about the status of her referral to cardiology regarding abnormal EKG and risk factors

## 2018-07-29 ENCOUNTER — Telehealth: Payer: Self-pay

## 2018-07-29 NOTE — Telephone Encounter (Signed)
Patient stated the last time she was in, she was informed she could start a antidepressant. She stated she is now really anxious with everything that is going on and wants to know if something can be sent to the pharmacy for her. Please advise. Patient can be reached at the number below.        [phone]

## 2018-07-31 ENCOUNTER — Other Ambulatory Visit: Payer: Self-pay | Admitting: Medical

## 2018-07-31 NOTE — Telephone Encounter (Signed)
Call and have her remind me what medications she has used in the past if any?   Citalopram?  Effexor?  Other?

## 2018-08-01 ENCOUNTER — Other Ambulatory Visit: Payer: Self-pay | Admitting: Medical

## 2018-08-01 MED ORDER — VENLAFAXINE HCL ER 37.5 MG PO CP24: 37.5000 mg | ORAL_CAPSULE | Freq: Every day | ORAL | 0 refills | Status: DC

## 2018-08-01 NOTE — Telephone Encounter (Signed)
Lets have her try Effexor XR once daily low-dose.  I sent this to the local pharmacy.  Most people tolerate this okay however if she starts having any side effects such as feeling unusual, feeling worse depressed, feeling loopy, then call back right away.  Some people do get a little nausea or feel a little uneasy the first week but then that normally goes away.  If this is too expensive let me know and I will change it to something else  I would like to do a follow-up in 3 to 4 weeks, which may either be in person or by phone.  We are starting to do phone consults now as a way to keep people from coming in and being exposed to other sick people, so this may be an option  I hope this medicine helps, but keep me informed if any issues with the medication

## 2018-08-01 NOTE — Telephone Encounter (Signed)
Patient stated she is not sure that she was taking any medication before but her daughter had a lot of issues with depression. She stated she would like to be on something with low side effects. Please advise further.

## 2018-08-01 NOTE — Telephone Encounter (Signed)
Patient has been informed of provider message. Patient said thank you so much and she'll keep [place] informed of how she is doing on the medication.

## 2018-08-02 NOTE — Progress Notes (Deleted)
Subjective:   Shelby Randolph, female    DOB: [birth year], 68 y.o.   MRN: [id number]  No chief complaint on file.    Patient referred by Carlena Hurl for abnormal EKG.  HPI: Ms. Belk is a 68 year old female with hypertension, hyperlipidemia, aortic atherosclerosis, Lynch syndrome, colonic tubular adenomas, chronic migraines, recently evaluated by PCP for annual exam and noted to have abnormal EKG.   PCP EKG 07/07/2018: Sinus bradycardia at 51 bpm, left axis deviation, left anterior fasicular block, incomplete right bundle branch block. Nonspecific ST-T wave abnormality.  Past Medical History:  Diagnosis Date  . Adenomatous colon polyp   . Allergy   . Anemia   . Anxiety   . Asthma   . Atherosclerosis of aorta ([place]) 03/2014   abnormal on ultrasound, repeat US within 5 years  . Depression   . Diverticulosis   . Eye injury    in college, some pertinent loss of vision in left eye  . Genital herpes   . GERD (gastroesophageal reflux disease)   . History of bone density study 09/2011   normal  . History of mammogram    Dr. Radene Knee  . History of melanoma    sees [facility], dermatology  . History of PFTs 01/20/2011   moderate airflow limitation, no significant response to bronchodilator  . Hyperlipidemia   . Hypertension 12/2011  . Insomnia   . Migraines    Dr. Melton Alar, migraines  . MSH6-related Lynch syndrome (HNPCC5) 08/20/2017  . Nearsightedness    wears glasses  . Post-operative nausea and vomiting   . Routine gynecological examination    Dr. Radene Knee  . Zika virus disease 06/2014    Past Surgical History:  Procedure Laterality Date  . COLONOSCOPY  2016   Dr. Silvano Rusk, 2014 Dr. Earlean Shawl  . ESOPHAGOGASTRODUODENOSCOPY    . ROTATOR CUFF REPAIR Right 05/2010  . TONSILLECTOMY AND ADENOIDECTOMY     as a kid  . TUBAL LIGATION  1985  . VAGINAL HYSTERECTOMY  1998   total; abnormal peroids  . VARICOSE VEIN SURGERY     bilat    Family History  Problem  Relation Age of Onset  . COPD Mother        died age 72yo.  emphysema  . Colon cancer Father 66        died age 7yo  . Colon cancer Brother 101  . Breast cancer Maternal Aunt   . Colon cancer Paternal Uncle        2  . Breast cancer Daughter   . Colon cancer Paternal Grandmother 55       colon cancer   . Diabetes Neg Hx   . Heart disease Neg Hx   . Hypertension Neg Hx   . Stroke Neg Hx     Social History   Socioeconomic History  . Marital status: Widowed    Spouse name: Not on file  . Number of children: 5  . Years of education: Not on file  . Highest education level: Not on file  Occupational History  . Occupation: compliance agent Investment banker, corporate)    Employer: [employer]    Comment: Nurse Practitioner  Social Needs  . Financial resource strain: Not on file  . Food insecurity:    Worry: Not on file    Inability: Not on file  . Transportation needs:    Medical: Not on file    Non-medical: Not on file  Tobacco Use  .  Smoking status: Never Smoker  . Smokeless tobacco: Never Used  Substance and Sexual Activity  . Alcohol use: Yes    Alcohol/week: 2.0 standard drinks    Types: 2 Glasses of wine per week  . Drug use: No  . Sexual activity: Not on file    Comment: exercise - walks, [place], cardio at gym 3 x/wk, yoga, widowed   Lifestyle  . Physical activity:    Days per week: Not on file    Minutes per session: Not on file  . Stress: Not on file  Relationships  . Social connections:    Talks on phone: Not on file    Gets together: Not on file    Attends religious service: Not on file    Active member of club or organization: Not on file    Attends meetings of clubs or organizations: Not on file    Relationship status: Not on file  . Intimate partner violence:    Fear of current or ex partner: Not on file    Emotionally abused: Not on file    Physically abused: Not on file    Forced sexual activity: Not on file  Other Topics Concern  . 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 acetaminophen (TYLENOL) 500 MG tablet 1-2 tablets twice to three times daily as needed. 100 tablet 5  . albuterol (PROVENTIL HFA;VENTOLIN HFA) 108 (90 Base) MCG/ACT inhaler Inhale 2 puffs into the lungs as needed. (Patient not taking: Reported on 07/07/2018) 1 Inhaler 1  . alosetron (LOTRONEX) 0.5 MG tablet TAKE TWO TABLETS TWICE DAILY 60 tablet 2  . aspirin EC 81 MG tablet Take 1 tablet (81 mg total) by mouth daily. 90 tablet 3  . atenolol (TENORMIN) 25 MG tablet TAKE ONE TABLET EACH DAY 90 tablet 3  . atorvastatin (LIPITOR) 20 MG tablet Take 1 tablet (20 mg total) by mouth daily. 90 tablet 3  . cetirizine (ZYRTEC) 10 MG tablet Take 10 mg by mouth daily.    . cholecalciferol (VITAMIN D3) 25 MCG (1000 UT) tablet Take 1 tablet (1,000 Units total) by mouth daily. 90 tablet 3  . hydrochlorothiazide (MICROZIDE) 12.5 MG capsule TAKE ONE CAPSULE EACH DAY 90 capsule 3  . metoCLOPramide (REGLAN) 5 MG tablet TAKE ONE TABLET TWICE DAILY (Patient not taking: Reported on 01/24/2018) 180 tablet 0  . Multiple Vitamin (MULTIVITAMIN) capsule Take 1 capsule by mouth daily.    . ondansetron (ZOFRAN) 4 MG tablet TAKE ONE TABLET EVERY EIGHT HOURS AS NEEDED FOR NAUSEA AND VOMITING (AND TO PREVENT DIARRHEA-MAY USE 2 TABLETS ) (Patient not taking: Reported on 07/07/2018) 30 tablet 0  . Probiotic Product (PRO-BIOTIC BLEND PO) Take 1 tablet by mouth daily.    Marland Kitchen triamcinolone cream (KENALOG) 0.1 % Apply 1 application topically 2 (two) times daily. (Patient not taking: Reported on 09/02/2017) 45 g 0  . venlafaxine XR (EFFEXOR XR) 37.5 MG 24 hr capsule Take 1  capsule (37.5 mg total) by mouth daily. 30 capsule 0  . zolpidem (AMBIEN) 10 MG tablet TAKE 1/2 TO 1 TABLET AT BEDTIME 90 tablet 1   Current Facility-Administered Medications on File Prior  to Visit  Medication Dose Route Frequency Provider Last Rate Last Dose  . 0.9 %  sodium chloride infusion  500 mL Intravenous Once Gatha Mayer, MD      . 0.9 %  sodium chloride infusion  500 mL Intravenous Once Gatha Mayer, MD      . albuterol (PROVENTIL) (5 MG/ML) 0.5% nebulizer solution 2.5 mg  2.5 mg Nebulization Once Rita Ohara, MD         ROS     Objective:     There were no vitals taken for this visit.  Physical Exam    Cardiac studies:   PCP EKG 07/07/2018: Sinus bradycardia at 51 bpm, left axis deviation, left anterior fasicular block, incomplete right bundle branch block. Nonspecific ST-T wave abnormality.  Abd [place] 03/28/2014:  1. No acute findings. 2. Gallstones. 3. Ectatic abdominal aorta at risk for aneurysm development. Recommend followup by [place] in 5 years    Recent Labs: CMP     Component Value Date/Time   NA 142 07/07/2018 0920   NA 142 05/23/2014 1335   K 4.2 07/07/2018 0920   K 4.1 05/23/2014 1335   CL 103 07/07/2018 0920   CO2 25 07/07/2018 0920   CO2 28 05/23/2014 1335   GLUCOSE 92 07/07/2018 0920   GLUCOSE 132 (H) 04/02/2017 0849   GLUCOSE 89 05/23/2014 1335   GLUCOSE 90 04/23/2009   BUN 21 07/07/2018 0920   BUN 20.7 05/23/2014 1335   CREATININE 0.90 07/07/2018 0920   CREATININE 0.82 06/25/2016 0953   CREATININE 0.8 05/23/2014 1335   CALCIUM 9.5 07/07/2018 0920   CALCIUM 9.5 05/23/2014 1335   PROT 6.6 07/07/2018 0920   PROT 7.1 05/23/2014 1335   ALBUMIN 4.5 07/07/2018 0920   ALBUMIN 4.3 05/23/2014 1335   AST 24 07/07/2018 0920   AST 29 05/23/2014 1335   ALT 15 07/07/2018 0920   ALT 16 05/23/2014 1335   ALKPHOS 52 07/07/2018 0920   ALKPHOS 66 05/23/2014 1335   BILITOT 0.5 07/07/2018 0920   BILITOT 0.69 05/23/2014 1335   GFRNONAA 66  07/07/2018 0920   GFRAA 76 07/07/2018 0920   CBC    Component Value Date/Time   WBC 4.6 01/24/2018 1020   WBC 5.6 04/02/2017 0849   RBC 4.16 01/24/2018 1020   RBC 4.22 04/02/2017 0849   HGB 13.0 01/24/2018 1020   HGB 12.1 05/23/2014 1335   HCT 38.5 01/24/2018 1020   HCT 36.8 05/23/2014 1335   PLT 164 01/24/2018 1020   MCV 93 01/24/2018 1020   MCV 90.4 05/23/2014 1335   MCH 31.3 01/24/2018 1020   MCH 30.6 04/02/2017 0849   MCHC 33.8 01/24/2018 1020   MCHC 34.2 04/02/2017 0849   RDW 13.3 01/24/2018 1020   RDW 13.7 05/23/2014 1335   LYMPHSABS 1.3 07/29/2015 0001   LYMPHSABS 1.4 05/23/2014 1335   MONOABS 0.3 07/29/2015 0001   MONOABS 0.4 05/23/2014 1335   EOSABS 0.0 07/29/2015 0001   EOSABS 0.0 05/23/2014 1335   BASOSABS 0.0 07/29/2015 0001   BASOSABS 0.0 05/23/2014 1335   Lipid Panel     Component Value Date/Time   CHOL 173 07/07/2018 0920   TRIG 92 07/07/2018 0920   HDL 77 07/07/2018 0920   CHOLHDL 2.2 07/07/2018 0920   CHOLHDL 2.8 06/25/2016 0953   VLDL 18 06/25/2016 0953   LDLCALC 78 07/07/2018 0920   LDLDIRECT 140.1 03/01/2009 0000         Assessment & Recommendations:   There are no  diagnoses linked to this encounter.   Thank you for referring this patient. Please do not hesitate to contact me for any questions.   Jeri Lager, MSN, APRN, FNP-C [facility], [place] Office: [phone] Fax: [phone]

## 2018-08-03 ENCOUNTER — Ambulatory Visit: Payer: Self-pay | Admitting: Cardiology

## 2018-09-02 ENCOUNTER — Ambulatory Visit: Payer: Self-pay | Admitting: Cardiology

## 2018-09-16 ENCOUNTER — Other Ambulatory Visit: Payer: Self-pay | Admitting: Medical

## 2018-09-16 NOTE — Telephone Encounter (Signed)
Is this ok to refill?  

## 2018-09-22 DIAGNOSIS — D485 Neoplasm of uncertain behavior of skin: Secondary | ICD-10-CM | POA: Diagnosis not present

## 2018-09-22 DIAGNOSIS — L821 Other seborrheic keratosis: Secondary | ICD-10-CM | POA: Diagnosis not present

## 2018-09-22 DIAGNOSIS — L570 Actinic keratosis: Secondary | ICD-10-CM | POA: Diagnosis not present

## 2018-09-22 DIAGNOSIS — C4442 Squamous cell carcinoma of skin of scalp and neck: Secondary | ICD-10-CM | POA: Diagnosis not present

## 2018-09-22 DIAGNOSIS — Z85828 Personal history of other malignant neoplasm of skin: Secondary | ICD-10-CM | POA: Diagnosis not present

## 2018-09-22 DIAGNOSIS — L814 Other melanin hyperpigmentation: Secondary | ICD-10-CM | POA: Diagnosis not present

## 2018-09-22 DIAGNOSIS — D225 Melanocytic nevi of trunk: Secondary | ICD-10-CM | POA: Diagnosis not present

## 2018-09-22 DIAGNOSIS — L449 Papulosquamous disorder, unspecified: Secondary | ICD-10-CM | POA: Diagnosis not present

## 2018-10-04 NOTE — Progress Notes (Signed)
 Primary Physician:  Tysinger, David S, PA-C   Patient ID: Shelby Randolph, female    DOB: 11/15/1950, 67 y.o.   MRN: [id number]  Subjective:    Chief Complaint  Patient presents with  . Abnormal ECG    pt had changes in recent EKG    HPI: Shelby Randolph  is a 67 y.o. female  with hypertension, hyperlipidemia, aortic atherosclerosis, depression, GERD, Lynch syndrome, referred to us by PCP, Shane Tysinger, PA for evaluation of abnormal EKG.   She denies any chest pain, shortness of breath, leg edema, palpitations, PND or orthopnea, or symptoms suggestive of claudication or TIA. She is having difficulty with controlling her IBS-D. She sees Dr. Gessener for management.  She states that hypertension and hyperlipidemia are well controlled. Takes lipitor 3 days a week due to cramping with daily dosing.  She does exercise regularly with walking approximately 3 miles daily. No difficulty with doing this. She was previously cycling at the gym, but has not done this recently in view of COVID.  No family history of heart disease. No former tobacco use or drug use. Occassional social alcohol use.     Past Medical History:  Diagnosis Date  . Adenomatous colon polyp   . Allergy   . Anemia   . Anxiety   . Asthma   . Atherosclerosis of aorta (HCC) 03/2014   abnormal on ultrasound, repeat US within 5 years  . Depression   . Diverticulosis   . Eye injury    in college, some pertinent loss of vision in left eye  . Genital herpes   . GERD (gastroesophageal reflux disease)   . History of bone density study 09/2011   normal  . History of mammogram    Dr. McComb  . History of melanoma    sees [facility], dermatology  . History of PFTs 01/20/2011   moderate airflow limitation, no significant response to bronchodilator  . Hyperlipidemia   . Hypertension 12/2011  . Insomnia   . Migraines    Dr. Lewitt, migraines  . MSH6-related Lynch syndrome (HNPCC5) 08/20/2017  . Nearsightedness    wears glasses  . Post-operative nausea and vomiting   . Routine gynecological examination    Dr. McComb  . Zika virus disease 06/2014    Past Surgical History:  Procedure Laterality Date  . COLONOSCOPY  2016   Dr. Carl Gessner, 2014 Dr. Medoff  . ESOPHAGOGASTRODUODENOSCOPY    . ROTATOR CUFF REPAIR Right 05/2010  . TONSILLECTOMY AND ADENOIDECTOMY     as a kid  . TUBAL LIGATION  1985  . VAGINAL HYSTERECTOMY  1998   total; abnormal peroids  . VARICOSE VEIN SURGERY     bilat    Social History   Socioeconomic History  . Marital status: Widowed    Spouse name: Not on file  . Number of children: 5  . Years of education: Not on file  . Highest education level: Not on file  Occupational History  . Occupation: compliance agent (RN)    Employer: [employer]    Comment: Nurse Practitioner  Social Needs  . Financial resource strain: Not on file  . Food insecurity:    Worry: Not on file    Inability: Not on file  . Transportation needs:    Medical: Not on file    Non-medical: Not on file  Tobacco Use  . Smoking status: Never Smoker  . Smokeless tobacco: Never Used  Substance and Sexual Activity  .   Alcohol use: Yes    Alcohol/week: 2.0 standard drinks    Types: 2 Glasses of wine per week  . Drug use: No  . Sexual activity: Not on file    Comment: exercise - walks, [place], cardio at gym 3 x/wk, yoga, widowed   Lifestyle  . Physical activity:    Days per week: Not on file    Minutes per session: Not on file  . Stress: Not on file  Relationships  . Social connections:    Talks on phone: Not on file    Gets together: Not on file    Attends religious service: Not on file    Active member of club or organization: Not on file    Attends meetings of clubs or organizations: Not on file    Relationship status: Not on file  . Intimate partner violence:    Fear of current or ex partner: Not on file    Emotionally abused: Not on file    Physically  abused: Not on file    Forced sexual activity: Not on file  Other Topics Concern  . 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  Eyes: Negative for visual disturbance.  Cardiovascular: Negative for chest pain, claudication, dyspnea on exertion, leg swelling, orthopnea, palpitations and syncope.  Respiratory: Negative for hemoptysis and wheezing.   Endocrine: Negative for cold intolerance and heat intolerance.  Hematologic/Lymphatic: Does not bruise/bleed easily.  Skin: Negative for nail changes.  Musculoskeletal: Negative for muscle weakness and myalgias.  Gastrointestinal: Positive for diarrhea (IBS-D). Negative for abdominal pain, change in bowel habit, nausea and vomiting.  Neurological: Negative for difficulty with concentration, dizziness, focal weakness and headaches.  Psychiatric/Behavioral: Negative for altered mental status and suicidal ideas.  All other systems reviewed and are negative.     Objective:  Blood pressure (!) 145/84, pulse 64, temperature 97.7 F (36.5 C), height 5' 8" (1.727 m), weight 167 lb (75.8 kg), SpO2 97 %. Body mass index is 25.39 kg/m.    Physical Exam  Constitutional: She is oriented to person, place, and time. Vital signs are normal. She appears well-developed and well-nourished.  HENT:  Head: Normocephalic and atraumatic.  Neck: Normal range of motion.  Cardiovascular: Normal rate, regular rhythm, normal heart sounds and intact distal pulses.  Pulmonary/Chest: Effort normal and breath sounds normal. No accessory muscle usage. No respiratory distress.  Abdominal: Soft. Bowel sounds are normal.   Musculoskeletal: Normal range of motion.  Neurological: She is alert and oriented to person, place, and time.  Skin: Skin is warm and dry.  Vitals reviewed.  Radiology:  CTA Abdomen and Pelvis 04/02/2017: 1. No acute findings. 2. Descending and sigmoid diverticulosis 3. Cholelithiasis 4. Lumbar spondylitic changes 5.  Aortic Atherosclerosis (ICD10-170.0)  Laboratory examination:    CMP Latest Ref Rng & Units 07/07/2018 01/24/2018 07/07/2017  Glucose 65 - 99 mg/dL 92 94 85  BUN 8 - 27 mg/dL _0 Creatinine 0.57 - 1.00 mg/dL 0.90 0.87 0.87  Sodium 134 - 144 mmol/L 142 145(H) 143  Potassium 3.5 - 5.2 mmol/L 4.2 4.4 4.0  Chloride 96 - 106 mmol/L 103 103 104  CO2 20 - 29 mmol/L _0 Calcium 8.7 - 10.3 mg/dL 9.5 9.9 9.8  Total Protein 6.0 - 8.5 g/dL 6.6 6.9 6.7  Total Bilirubin 0.0 - 1.2 mg/dL 0.5 0.6 0.6  Alkaline Phos 39 - 117 IU/L 52 55 52  AST 0 - 40 IU/L _1 ALT 0 - 32 IU/L _2 CBC Latest Ref Rng & Units 01/24/2018 04/02/2017 06/25/2016  WBC 3.4 - 10.8 x10E3/uL 4.6 5.6 3.9(L)  Hemoglobin 11.1 - 15.9 g/dL 13.0 12.9 12.5  Hematocrit 34.0 - 46.6 % 38.5 37.7 37.3  Platelets 150 - 450 x10E3/uL 164 143(L) 172   Lipid Panel     Component Value Date/Time   CHOL 173 07/07/2018 0920   TRIG 92 07/07/2018 0920   HDL 77 07/07/2018 0920   CHOLHDL 2.2 07/07/2018 0920   CHOLHDL 2.8 06/25/2016 0953   VLDL 18 06/25/2016 0953   LDLCALC 78 07/07/2018 0920   LDLDIRECT 140.1 03/01/2009 0000   HEMOGLOBIN A1C Lab Results  Component Value Date   HGBA1C 5.4 05/23/2015   MPG 108 05/23/2015   TSH Recent Labs    07/07/18 0920  TSH 1.700    PRN Meds:. Medications Discontinued During This Encounter  Medication Reason  . zolpidem (AMBIEN) 10 MG tablet Duplicate   Current Meds  Medication Sig  . acetaminophen (TYLENOL) 500 MG tablet 1-2 tablets twice to three times daily as needed.  Marland Kitchen albuterol (PROVENTIL HFA;VENTOLIN HFA) 108 (90 Base) MCG/ACT inhaler Inhale 2  puffs into the lungs as needed.  Marland Kitchen alosetron (LOTRONEX) 0.5 MG tablet TAKE TWO TABLETS TWICE DAILY  . aspirin EC 81 MG tablet Take 1 tablet (81 mg total) by mouth daily.  Marland Kitchen atenolol (TENORMIN) 25 MG tablet TAKE ONE TABLET EACH DAY  . atorvastatin (LIPITOR) 20 MG tablet Take 1 tablet (20 mg total) by mouth daily.  . cetirizine (ZYRTEC) 10 MG tablet Take 10 mg by mouth daily.  . cholecalciferol (VITAMIN D3) 25 MCG (1000 UT) tablet Take 1 tablet (1,000 Units total) by mouth daily.  . hydrochlorothiazide (MICROZIDE) 12.5 MG capsule TAKE ONE CAPSULE EACH DAY  . metoCLOPramide (REGLAN) 5 MG tablet TAKE ONE TABLET TWICE DAILY  . Multiple Vitamin (MULTIVITAMIN) capsule Take 1 capsule by mouth daily.  . ondansetron (ZOFRAN) 4 MG tablet TAKE ONE TABLET EVERY EIGHT HOURS AS NEEDED FOR NAUSEA AND VOMITING (AND TO PREVENT DIARRHEA-MAY USE 2 TABLETS )  . Probiotic Product (PRO-BIOTIC BLEND PO) Take 1 tablet by mouth daily.  Marland Kitchen triamcinolone cream (KENALOG) 0.1 % Apply 1 application topically 2 (two) times daily.  Marland Kitchen zolpidem (AMBIEN) 10 MG tablet Take 1 tablet by mouth daily.  . [DISCONTINUED] zolpidem (AMBIEN) 10 MG tablet TAKE 1/2 TO 1 TABLET AT BEDTIME   Current Facility-Administered Medications for the 10/05/18 encounter (Office Visit) with Miquel Dunn, NP  Medication  . 0.9 %  sodium chloride infusion  . 0.9 %  sodium chloride infusion  . albuterol (PROVENTIL) (5 MG/ML) 0.5% nebulizer solution 2.5 mg    Cardiac Studies:     Assessment:   Abnormal EKG - Plan: EKG 12-Lead, PCV ECHOCARDIOGRAM COMPLETE  Essential hypertension - Plan: PCV ECHOCARDIOGRAM COMPLETE  Mild hyperlipidemia  Aortic atherosclerosis (HCC)  EKG 10/05/2018: Sinus bradycardia at 59 bpm, left anterior fasicular block, nonspecific ST-T wave abnormality.   Recommendations:   Patient is found to have nonspecific EKG changes compared to EKG in 2017. She is asymptomatic and exercises regularly. I suspect her EKG  changes may be related to hypertension. Will obtain echocardiogram to further evaluate for any structural abnormalities. Do not feel that she needs stress testing in view of lack of symptoms. No history of diabetes. Hyperlipidemia is well controlled. Blood pressure is elevated today, but generally well controlled. Will reevaluate at her next office visit. I have advised her to continue with ASA and statin in view of aortic atherosclerosis. She does not have any bleeding problems. I will see her back same day as her echocardiogram to discuss results and for further recommendations.    *I have discussed this case with Dr. Patwardhan and he personally examined the patient and participated in formulating the plan.*    Ashton Haynes Kelley, MSN, APRN, FNP-C [facility] Office: [phone] Fax: [phone] 

## 2018-10-05 ENCOUNTER — Encounter: Payer: Self-pay | Admitting: Cardiology

## 2018-10-05 ENCOUNTER — Ambulatory Visit: Payer: PPO | Admitting: Cardiology

## 2018-10-05 ENCOUNTER — Other Ambulatory Visit: Payer: Self-pay

## 2018-10-05 VITALS — BP 145/84 | HR 64 | Temp 97.7°F | Ht 68.0 in | Wt 167.0 lb

## 2018-10-05 DIAGNOSIS — I700 Atherosclerosis of aorta: Secondary | ICD-10-CM | POA: Diagnosis not present

## 2018-10-05 DIAGNOSIS — R9431 Abnormal electrocardiogram [ECG] [EKG]: Secondary | ICD-10-CM | POA: Diagnosis not present

## 2018-10-05 DIAGNOSIS — I10 Essential (primary) hypertension: Secondary | ICD-10-CM | POA: Diagnosis not present

## 2018-10-05 DIAGNOSIS — E785 Hyperlipidemia, unspecified: Secondary | ICD-10-CM | POA: Diagnosis not present

## 2018-10-06 ENCOUNTER — Other Ambulatory Visit: Payer: Self-pay | Admitting: Physician Assistant

## 2018-10-06 NOTE — Telephone Encounter (Signed)
May I refill this [name]?  Thank you for your time.

## 2018-10-13 ENCOUNTER — Other Ambulatory Visit: Payer: Self-pay | Admitting: Medical

## 2018-10-13 NOTE — Telephone Encounter (Signed)
Is this ok to refill?  

## 2018-10-21 ENCOUNTER — Other Ambulatory Visit: Payer: Self-pay | Admitting: Internal Medicine

## 2018-10-21 NOTE — Telephone Encounter (Signed)
May I refill [name], she saw you 11/2017. Thank you.

## 2018-10-28 ENCOUNTER — Ambulatory Visit (INDEPENDENT_AMBULATORY_CARE_PROVIDER_SITE_OTHER): Payer: PPO | Admitting: Cardiology

## 2018-10-28 ENCOUNTER — Ambulatory Visit (INDEPENDENT_AMBULATORY_CARE_PROVIDER_SITE_OTHER): Payer: PPO

## 2018-10-28 ENCOUNTER — Other Ambulatory Visit: Payer: Self-pay | Admitting: Medical

## 2018-10-28 ENCOUNTER — Other Ambulatory Visit: Payer: Self-pay

## 2018-10-28 ENCOUNTER — Encounter: Payer: Self-pay | Admitting: Cardiology

## 2018-10-28 VITALS — BP 133/76 | HR 56 | Ht 68.0 in | Wt 165.0 lb

## 2018-10-28 DIAGNOSIS — I700 Atherosclerosis of aorta: Secondary | ICD-10-CM

## 2018-10-28 DIAGNOSIS — I10 Essential (primary) hypertension: Secondary | ICD-10-CM | POA: Diagnosis not present

## 2018-10-28 DIAGNOSIS — Z9289 Personal history of other medical treatment: Secondary | ICD-10-CM

## 2018-10-28 DIAGNOSIS — R0609 Other forms of dyspnea: Secondary | ICD-10-CM

## 2018-10-28 DIAGNOSIS — E785 Hyperlipidemia, unspecified: Secondary | ICD-10-CM

## 2018-10-28 DIAGNOSIS — R9431 Abnormal electrocardiogram [ECG] [EKG]: Secondary | ICD-10-CM

## 2018-10-28 HISTORY — DX: Personal history of other medical treatment: Z92.89

## 2018-10-28 MED ORDER — TRAMADOL HCL 50 MG PO TABS: 50.0000 mg | ORAL_TABLET | Freq: Four times a day (QID) | ORAL | 0 refills | Status: DC | PRN

## 2018-10-28 NOTE — Progress Notes (Signed)
 Primary Physician:  Tysinger, David S, PA-C   Patient ID: Shelby Randolph, female    DOB: 01/31/1951, 67 y.o.   MRN: [id number]  Subjective:    Chief Complaint  Patient presents with  . Abnormal ECG  . Follow-up    echo results    HPI: Shelby Randolph  is a 67 y.o. female  with hypertension, hyperlipidemia, aortic atherosclerosis, depression, GERD, Lynch syndrome, recently evaluated by us for abnormal EKG. Underwent echocardiogram and here to discuss results.   She denies any chest pain, leg edema, palpitations, PND or orthopnea, or symptoms suggestive of claudication or TIA. She does admit to dyspnea on exertion over the last few months that she attributes to not being in her regular spin classes due to COVID. She does walk 2 miles daily.  She states that hypertension and hyperlipidemia are well controlled. States that her blood pressure actually runs fairly low. Takes lipitor 3 days a week due to cramping with daily dosing. She is having difficulty with controlling her IBS-D. She sees Dr. Gessener for management.   No family history of heart disease. No former tobacco use or drug use. Occassional social alcohol use.     Past Medical History:  Diagnosis Date  . Adenomatous colon polyp   . Allergy   . Anemia   . Anxiety   . Asthma   . Atherosclerosis of aorta (HCC) 03/2014   abnormal on ultrasound, repeat US within 5 years  . Depression   . Diverticulosis   . Eye injury    in college, some pertinent loss of vision in left eye  . Genital herpes   . GERD (gastroesophageal reflux disease)   . History of bone density study 09/2011   normal  . History of mammogram    Dr. McComb  . History of melanoma    sees Hope Grueber, dermatology  . History of PFTs 01/20/2011   moderate airflow limitation, no significant response to bronchodilator  . Hyperlipidemia   . Hypertension 12/2011  . Insomnia   . Migraines    Dr. Lewitt, migraines  . MSH6-related Lynch syndrome (HNPCC5)  08/20/2017  . Nearsightedness    wears glasses  . Post-operative nausea and vomiting   . Routine gynecological examination    Dr. McComb  . Zika virus disease 06/2014    Past Surgical History:  Procedure Laterality Date  . COLONOSCOPY  2016   Dr. Carl Gessner, 2014 Dr. Medoff  . ESOPHAGOGASTRODUODENOSCOPY    . ROTATOR CUFF REPAIR Right 05/2010  . TONSILLECTOMY AND ADENOIDECTOMY     as a kid  . TUBAL LIGATION  1985  . VAGINAL HYSTERECTOMY  1998   total; abnormal peroids  . VARICOSE VEIN SURGERY     bilat    Social History   Socioeconomic History  . Marital status: Widowed    Spouse name: Not on file  . Number of children: 5  . Years of education: Not on file  . Highest education level: Not on file  Occupational History  . Occupation: compliance agent (RN)    Employer: [employer]    Comment: Nurse Practitioner  Social Needs  . Financial resource strain: Not on file  . Food insecurity    Worry: Not on file    Inability: Not on file  . Transportation needs    Medical: Not on file    Non-medical: Not on file  Tobacco Use  . Smoking status: Never Smoker  . Smokeless tobacco: Never Used    Substance and Sexual Activity  . Alcohol use: Yes    Alcohol/week: 2.0 standard drinks    Types: 2 Glasses of wine per week  . Drug use: No  . Sexual activity: Not on file    Comment: exercise - walks[place] cardio at gym 3 x/wk, yoga, widowed   Lifestyle  . Physical activity    Days per week: Not on file    Minutes per session: Not on file  . Stress: Not on file  Relationships  . Social Herbalist on phone: Not on file    Gets together: Not on file    Attends religious service: Not on file    Active member of club or organization: Not on file    Attends meetings of clubs or organizations: Not on file    Relationship status: Not on file  . Intimate partner violence    Fear of current or ex partner: Not on file    Emotionally abused:  Not on file    Physically abused: Not on file    Forced sexual activity: Not on file  Other Topics Concern  . Not on file  Social History Narrative   Lives alone, has 5 children ([place], [place], [place]), has several grandchildren, daughter Junie Panning in [place] in the [employer], exercises 5 days per week - walking, kickboxing, yoga; [employer];  Works for [employer] in compliance    Review of Systems  Constitution: Negative for decreased appetite, malaise/fatigue, weight gain and weight loss.  Eyes: Negative for visual disturbance.  Cardiovascular: Positive for dyspnea on exertion. Negative for chest pain, claudication, leg swelling, orthopnea, palpitations and syncope.  Respiratory: Negative for hemoptysis and wheezing.   Endocrine: Negative for cold intolerance and heat intolerance.  Hematologic/Lymphatic: Does not bruise/bleed easily.  Skin: Negative for nail changes.  Musculoskeletal: Negative for muscle weakness and myalgias.  Gastrointestinal: Positive for diarrhea (IBS-D). Negative for abdominal pain, change in bowel habit, nausea and vomiting.  Neurological: Negative for difficulty with concentration, dizziness, focal weakness and headaches.  Psychiatric/Behavioral: Negative for altered mental status and suicidal ideas.  All other systems reviewed and are negative.     Objective:  Blood pressure 133/76, pulse (!) 56, height 5' 8" (1.727 m), weight 165 lb (74.8 kg), SpO2 90 %. Body mass index is 25.09 kg/m.    Physical Exam  Constitutional: She is oriented to person, place, and time. Vital signs are normal. She appears well-developed and well-nourished.  HENT:  Head: Normocephalic and atraumatic.  Neck: Normal range of motion.  Cardiovascular: Normal rate, regular rhythm, normal heart sounds and intact distal pulses.  Pulmonary/Chest: Effort normal and breath sounds normal. No accessory muscle usage. No respiratory distress.  Abdominal: Soft. Bowel sounds are  normal.  Musculoskeletal: Normal range of motion.  Neurological: She is alert and oriented to person, place, and time.  Skin: Skin is warm and dry.  Vitals reviewed.  Radiology:  CTA Abdomen and Pelvis 04/02/2017: 1. No acute findings. 2. Descending and sigmoid diverticulosis 3. Cholelithiasis 4. Lumbar spondylitic changes 5.  Aortic Atherosclerosis (ICD10-170.0)  Laboratory examination:    CMP Latest Ref Rng & Units 07/07/2018 01/24/2018 07/07/2017  Glucose 65 - 99 mg/dL 92 94 85  BUN 8 - 27 mg/dL _0 Creatinine 0.57 - 1.00 mg/dL 0.90 0.87 0.87  Sodium 134 - 144 mmol/L 142 145(H) 143  Potassium 3.5 - 5.2 mmol/L 4.2 4.4 4.0  Chloride 96 - 106 mmol/L 103  103 104  CO2 20 - 29 mmol/L 25 23 25  Calcium 8.7 - 10.3 mg/dL 9.5 9.9 9.8  Total Protein 6.0 - 8.5 g/dL 6.6 6.9 6.7  Total Bilirubin 0.0 - 1.2 mg/dL 0.5 0.6 0.6  Alkaline Phos 39 - 117 IU/L 52 55 52  AST 0 - 40 IU/L 24 29 20  ALT 0 - 32 IU/L 15 17 15   CBC Latest Ref Rng & Units 01/24/2018 04/02/2017 06/25/2016  WBC 3.4 - 10.8 x10E3/uL 4.6 5.6 3.9(L)  Hemoglobin 11.1 - 15.9 g/dL 13.0 12.9 12.5  Hematocrit 34.0 - 46.6 % 38.5 37.7 37.3  Platelets 150 - 450 x10E3/uL 164 143(L) 172   Lipid Panel     Component Value Date/Time   CHOL 173 07/07/2018 0920   TRIG 92 07/07/2018 0920   HDL 77 07/07/2018 0920   CHOLHDL 2.2 07/07/2018 0920   CHOLHDL 2.8 06/25/2016 0953   VLDL 18 06/25/2016 0953   LDLCALC 78 07/07/2018 0920   LDLDIRECT 140.1 03/01/2009 0000   HEMOGLOBIN A1C Lab Results  Component Value Date   HGBA1C 5.4 05/23/2015   MPG 108 05/23/2015   TSH Recent Labs    07/07/18 0920  TSH 1.700    PRN Meds:. Medications Discontinued During This Encounter  Medication Reason  . venlafaxine XR (EFFEXOR-XR) 37.5 MG 24 hr capsule Error   Current Meds  Medication Sig  . acetaminophen (TYLENOL) 500 MG tablet 1-2 tablets twice to three times daily as needed.  . albuterol (PROVENTIL HFA;VENTOLIN HFA) 108 (90 Base)  MCG/ACT inhaler Inhale 2 puffs into the lungs as needed.  . alosetron (LOTRONEX) 0.5 MG tablet TAKE TWO TABLETS TWICE DAILY  . aspirin EC 81 MG tablet Take 1 tablet (81 mg total) by mouth daily.  . atenolol (TENORMIN) 25 MG tablet TAKE ONE TABLET EACH DAY  . atorvastatin (LIPITOR) 20 MG tablet Take 1 tablet (20 mg total) by mouth daily. (Patient taking differently: Take 20 mg by mouth 3 (three) times a week. )  . cetirizine (ZYRTEC) 10 MG tablet Take 10 mg by mouth daily.  . cholecalciferol (VITAMIN D3) 25 MCG (1000 UT) tablet Take 1 tablet (1,000 Units total) by mouth daily.  . hydrochlorothiazide (MICROZIDE) 12.5 MG capsule TAKE ONE CAPSULE EACH DAY  . metoCLOPramide (REGLAN) 5 MG tablet TAKE ONE TABLET TWICE DAILY (Patient taking differently: as needed. )  . Multiple Vitamin (MULTIVITAMIN) capsule Take 1 capsule by mouth daily.  . ondansetron (ZOFRAN) 4 MG tablet TAKE ONE TABLET EVERY EIGHT HOURS AS NEEDED FOR NAUSEA AND VOMITING -TO PREVENT DIARRHEA MAY USE 2 TABLETS  . Probiotic Product (PRO-BIOTIC BLEND PO) Take 1 tablet by mouth daily.  . traMADol (ULTRAM) 50 MG tablet Take 1 tablet (50 mg total) by mouth every 6 (six) hours as needed.  . triamcinolone cream (KENALOG) 0.1 % Apply 1 application topically 2 (two) times daily.  . zolpidem (AMBIEN) 10 MG tablet TAKE 1/2 TO 1 TABLET AT BEDTIME   Current Facility-Administered Medications for the 10/28/18 encounter (Office Visit) with Kelley, Ashton Haynes, NP  Medication  . 0.9 %  sodium chloride infusion  . 0.9 %  sodium chloride infusion  . albuterol (PROVENTIL) (5 MG/ML) 0.5% nebulizer solution 2.5 mg    Cardiac Studies:   Echocardiogram 10/28/2018: Mildly depressed LV systolic function with visual EF 45-50%. Left ventricle cavity is normal in size. Likely mild hypertensive heart disease. Normal global wall motion. Doppler evidence of grade I (impaired) diastolic dysfunction, normal LAP.  Mild (Grade I)   mitral regurgitation. Mild  tricuspid regurgitation.  No evidence of pulmonary hypertension.  Assessment:     ICD-10-CM   1. Dyspnea on exertion  R06.09 CT CORONARY MORPH W/CTA COR W/SCORE W/CA W/CM &/OR WO/CM  2. Abnormal EKG  R94.31   3. Essential hypertension  I10   4. Mild hyperlipidemia  E78.5   5. Aortic atherosclerosis (HCC)  I70.0     EKG 10/05/2018: Sinus bradycardia at 59 bpm, left anterior fasicular block, nonspecific ST-T wave abnormality.   Recommendations:   Discussed recent echocardiogram results with the patient, she is noted to have mildly depressed LVEF of 45 to 50%.  Although likely related to hypertensive heart disease, she actually states that her blood pressure generally runs low.  She monitors at home regularly.  She has recently had some nonspecific EKG changes and also admits to dyspnea on exertion over the last few months.  Although dyspnea may be related to changes in her exercise regimen and some deconditioning, feel that myocardial ischemia should be excluded.  She does have risk factors for CAD.  Will try to obtain coronary CTA for further evaluation.  If coronary CTA is negative, would recommend repeating echocardiogram in 1 year for surveillance.  She is on Lipitor and lipids are well controlled.  She was started on aspirin therapy at her last office visit, encouraged her to continue with this.  I will see her back after the test for further recommendations and reevaluation.   *I have discussed this case with Dr. Patwardhan and he participated in formulating the plan.*    Ashton Haynes Kelley, MSN, APRN, FNP-C [facility] Office: [phone] Fax: [phone] 

## 2018-10-29 ENCOUNTER — Encounter: Payer: Self-pay | Admitting: Cardiology

## 2018-10-31 ENCOUNTER — Encounter: Payer: Self-pay | Admitting: Medical

## 2018-11-03 ENCOUNTER — Encounter: Payer: Self-pay | Admitting: Internal Medicine

## 2018-11-03 ENCOUNTER — Other Ambulatory Visit: Payer: Self-pay | Admitting: Cardiology

## 2018-11-03 DIAGNOSIS — R0609 Other forms of dyspnea: Secondary | ICD-10-CM

## 2018-11-03 NOTE — Progress Notes (Signed)
BMP orders placed for CTA

## 2018-11-07 ENCOUNTER — Other Ambulatory Visit: Payer: Self-pay | Admitting: Cardiology

## 2018-11-07 DIAGNOSIS — R0609 Other forms of dyspnea: Secondary | ICD-10-CM | POA: Diagnosis not present

## 2018-11-08 ENCOUNTER — Encounter: Payer: Self-pay | Admitting: Internal Medicine

## 2018-11-08 LAB — BASIC METABOLIC PANEL
BUN/Creatinine Ratio: 19 (ref 12–28)
BUN: 17 mg/dL (ref 8–27)
CO2: 25 mmol/L (ref 20–29)
Calcium: 9.8 mg/dL (ref 8.7–10.3)
Chloride: 103 mmol/L (ref 96–106)
Creatinine, Ser: 0.9 mg/dL (ref 0.57–1.00)
GFR calc Af Amer: 76 mL/min/{1.73_m2} (ref >59)
GFR calc non Af Amer: 66 mL/min/{1.73_m2} (ref >59)
Glucose: 106 mg/dL — ABNORMAL HIGH (ref 65–99)
Potassium: 4.3 mmol/L (ref 3.5–5.2)
Sodium: 142 mmol/L (ref 134–144)

## 2018-11-16 ENCOUNTER — Other Ambulatory Visit: Payer: Self-pay

## 2018-11-16 ENCOUNTER — Ambulatory Visit (HOSPITAL_COMMUNITY)
Admission: RE | Admit: 2018-11-16 | Discharge: 2018-11-16 | Disposition: A | Discharge status: Home / Self Care | Payer: PPO | Source: Ambulatory Visit | Attending: Cardiology | Admitting: Cardiology

## 2018-11-16 ENCOUNTER — Encounter (HOSPITAL_COMMUNITY): Payer: Self-pay

## 2018-11-16 ENCOUNTER — Encounter: Payer: PPO | Admitting: *Deleted

## 2018-11-16 DIAGNOSIS — I700 Atherosclerosis of aorta: Secondary | ICD-10-CM | POA: Diagnosis not present

## 2018-11-16 DIAGNOSIS — Z85820 Personal history of malignant melanoma of skin: Secondary | ICD-10-CM | POA: Diagnosis not present

## 2018-11-16 DIAGNOSIS — Z79899 Other long term (current) drug therapy: Secondary | ICD-10-CM | POA: Insufficient documentation

## 2018-11-16 DIAGNOSIS — Z006 Encounter for examination for normal comparison and control in clinical research program: Secondary | ICD-10-CM

## 2018-11-16 DIAGNOSIS — E785 Hyperlipidemia, unspecified: Secondary | ICD-10-CM | POA: Insufficient documentation

## 2018-11-16 DIAGNOSIS — Z7982 Long term (current) use of aspirin: Secondary | ICD-10-CM | POA: Diagnosis not present

## 2018-11-16 DIAGNOSIS — R0609 Other forms of dyspnea: Secondary | ICD-10-CM | POA: Diagnosis not present

## 2018-11-16 DIAGNOSIS — I10 Essential (primary) hypertension: Secondary | ICD-10-CM | POA: Insufficient documentation

## 2018-11-16 MED ORDER — NITROGLYCERIN 0.4 MG SL SUBL
SUBLINGUAL_TABLET | SUBLINGUAL | Status: AC
  Filled 2018-11-16: qty 2

## 2018-11-16 MED ORDER — IOHEXOL 350 MG/ML SOLN
100.0000 mL | Freq: Once | INTRAVENOUS | Status: AC | PRN
  Administered 2018-11-16: 15:00:00 100 mL via INTRAVENOUS

## 2018-11-16 MED ORDER — NITROGLYCERIN 0.4 MG SL SUBL
0.8000 mg | SUBLINGUAL_TABLET | Freq: Once | SUBLINGUAL | Status: AC
  Administered 2018-11-16: 0.8 mg via SUBLINGUAL

## 2018-11-16 NOTE — Research (Signed)
CADFEM Informed Consent                  Subject Name:   Shelby Randolph   Subject met inclusion and exclusion criteria.  The informed consent form, study requirements and expectations were reviewed with the subject and questions and concerns were addressed prior to the signing of the consent form.  The subject verbalized understanding of the trial requirements.  The subject agreed to participate in the CADFEM trial and signed the informed consent.  The informed consent was obtained prior to performance of any protocol-specific procedures for the subject.  A copy of the signed informed consent was given to the subject and a copy was placed in the subject's medical record.   [place] Blessed Cotham, Research Assistant  11/16/2018 13:34 p.m.

## 2018-11-23 ENCOUNTER — Other Ambulatory Visit: Payer: Self-pay | Admitting: Medical

## 2018-11-23 ENCOUNTER — Telehealth: Payer: Self-pay | Admitting: Medical

## 2018-11-23 DIAGNOSIS — J4520 Mild intermittent asthma, uncomplicated: Secondary | ICD-10-CM

## 2018-11-23 MED ORDER — FLUTICASONE-SALMETEROL 250-50 MCG/DOSE IN AEPB: 1.0000 | INHALATION_SPRAY | Freq: Two times a day (BID) | RESPIRATORY_TRACT | 2 refills | Status: DC

## 2018-11-23 NOTE — Telephone Encounter (Signed)
Please see if we can get her set up for updated full PFTs before and after albuterol treatment with [place].  I am not sure if they have resumed testing.    If they do schedule, she will need to stop the Advair 3 days prior to this test and not use her albuterol the night before or morning of the test unless they give her different instructions.   FYI We discussed her concerns, reviewed her recent echo, CT coronary.   I recommended she call and speak to cardiology about next steps, possibly stress testing.   She will get back on Advair as she likely has uncontrolled asthma as well.   She will continue Albuterol prn.   She is taking Lipitor 3 days per week given history of leg aches.   She seems to tolerate 3 days per week.   She will let me know later in the week if adding back Advair helps.     We will look into getting updated PFT, but given covid pandemic PFTs were put on hold throughout the health system.

## 2018-11-23 NOTE — Progress Notes (Signed)
pft  

## 2018-11-24 ENCOUNTER — Other Ambulatory Visit: Payer: Self-pay

## 2018-11-24 DIAGNOSIS — J4520 Mild intermittent asthma, uncomplicated: Secondary | ICD-10-CM

## 2018-11-24 NOTE — Telephone Encounter (Signed)
Pt was called and referral was put in due to testing back up until aug. Pt was advised to look for two call from [facility] for a covid test that is required to have a PFT and to make appointment for PFT. [place]

## 2018-12-21 ENCOUNTER — Encounter: Payer: Self-pay | Admitting: Cardiology

## 2018-12-21 ENCOUNTER — Ambulatory Visit (INDEPENDENT_AMBULATORY_CARE_PROVIDER_SITE_OTHER): Payer: PPO | Admitting: Cardiology

## 2018-12-21 ENCOUNTER — Other Ambulatory Visit: Payer: Self-pay

## 2018-12-21 VITALS — BP 105/81 | HR 65 | Ht 68.0 in | Wt 174.0 lb

## 2018-12-21 DIAGNOSIS — R9431 Abnormal electrocardiogram [ECG] [EKG]: Secondary | ICD-10-CM

## 2018-12-21 DIAGNOSIS — R0609 Other forms of dyspnea: Secondary | ICD-10-CM

## 2018-12-21 DIAGNOSIS — E785 Hyperlipidemia, unspecified: Secondary | ICD-10-CM | POA: Diagnosis not present

## 2018-12-21 DIAGNOSIS — I10 Essential (primary) hypertension: Secondary | ICD-10-CM | POA: Diagnosis not present

## 2018-12-21 NOTE — Progress Notes (Signed)
Primary Physician:  Carlena Hurl, PA-C   Patient ID: Shelby Randolph, female    DOB: Sep 04, 1950, 68 y.o.   MRN: [id number]  Subjective:    Chief Complaint  Patient presents with  . doe  . cta results    HPI: [name]  is a 68 y.o. female  with hypertension, hyperlipidemia, aortic atherosclerosis, depression, GERD, Lynch syndrome, mildly depressed LVEF of 45-50% by recent echocardiogram, presents to discuss Coronary CTA results.  Due to abnormal EKG, she underwent echocardiogram on 10/31/2018 that showed mild depression of LVEF to 45-50%.Due to her dyspnea and echocardiogram findings, underwent coronary CTA.  She denies any chest pain, leg edema, palpitations, PND or orthopnea, or symptoms suggestive of claudication or TIA. She does does continue to notice dyspnea on exertion that she has attributed to not being as active as she was before COVID. She does have to clear her throat or cough while exercising.   She states that hypertension and hyperlipidemia are well controlled. States that her blood pressure actually runs fairly low. Takes lipitor 3 days a week due to cramping with daily dosing. She is having difficulty with controlling her IBS-D. She sees Dr. Lorayne Bender for management.   No family history of heart disease. No former tobacco use or drug use. Occassional social alcohol use.     Past Medical History:  Diagnosis Date  . Adenomatous colon polyp   . Allergy   . Anemia   . Anxiety   . Asthma   . Atherosclerosis of aorta ([place]) 03/2014   abnormal on ultrasound, repeat US within 5 years  . Depression   . Diverticulosis   . Eye injury    in college, some pertinent loss of vision in left eye  . Genital herpes   . GERD (gastroesophageal reflux disease)   . History of bone density study 09/2011   normal  . History of mammogram    Dr. Radene Knee  . History of melanoma    sees [facility], dermatology  . History of PFTs 01/20/2011   moderate airflow limitation,  no significant response to bronchodilator  . Hyperlipidemia   . Hypertension 12/2011  . Insomnia   . Migraines    Dr. Melton Alar, migraines  . MSH6-related Lynch syndrome (HNPCC5) 08/20/2017  . Nearsightedness    wears glasses  . Post-operative nausea and vomiting   . Routine gynecological examination    Dr. Radene Knee  . Zika virus disease 06/2014    Past Surgical History:  Procedure Laterality Date  . COLONOSCOPY  2016   Dr. Silvano Rusk, 2014 Dr. Earlean Shawl  . ESOPHAGOGASTRODUODENOSCOPY    . ROTATOR CUFF REPAIR Right 05/2010  . TONSILLECTOMY AND ADENOIDECTOMY     as a kid  . TUBAL LIGATION  1985  . VAGINAL HYSTERECTOMY  1998   total; abnormal peroids  . VARICOSE VEIN SURGERY     bilat    Social History   Socioeconomic History  . Marital status: Widowed    Spouse name: Not on file  . Number of children: 5  . Years of education: Not on file  . Highest education level: Not on file  Occupational History  . Occupation: compliance agent Investment banker, corporate)    Employer: [employer]    Comment: Nurse Practitioner  Social Needs  . Financial resource strain: Not on file  . Food insecurity    Worry: Not on file    Inability: Not on file  . Transportation needs    Medical: Not  on file    Non-medical: Not on file  Tobacco Use  . Smoking status: Never Smoker  . Smokeless tobacco: Never Used  Substance and Sexual Activity  . Alcohol use: Yes    Alcohol/week: 2.0 standard drinks    Types: 2 Glasses of wine per week  . Drug use: No  . Sexual activity: Not on file    Comment: exercise - walks, [place], cardio at gym 3 x/wk, yoga, widowed   Lifestyle  . Physical activity    Days per week: Not on file    Minutes per session: Not on file  . Stress: Not on file  Relationships  . Social Herbalist on phone: Not on file    Gets together: Not on file    Attends religious service: Not on file    Active member of club or organization: Not on file    Attends  meetings of clubs or organizations: Not on file    Relationship status: Not on file  . Intimate partner violence    Fear of current or ex partner: Not on file    Emotionally abused: Not on file    Physically abused: Not on file    Forced sexual activity: Not on file  Other Topics Concern  . 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  Eyes: Negative for visual disturbance.  Cardiovascular: Positive for dyspnea on exertion. Negative for chest pain, claudication, leg swelling, orthopnea, palpitations and syncope.  Respiratory: Negative for hemoptysis and wheezing.   Endocrine: Negative for cold intolerance and heat intolerance.  Hematologic/Lymphatic: Does not bruise/bleed easily.  Skin: Negative for nail changes.  Musculoskeletal: Negative for muscle weakness and myalgias.  Gastrointestinal: Positive for diarrhea (IBS-D). Negative for abdominal pain, change in bowel habit, nausea and vomiting.  Neurological: Negative for difficulty with concentration, dizziness, focal weakness and headaches.  Psychiatric/Behavioral: Negative for altered mental status and suicidal ideas.  All other systems reviewed and are negative.     Objective:  Blood pressure 105/81, pulse 65, height _0  (1.727 m), weight 174 lb (78.9 kg), SpO2 96 %. Body mass index is 26.46 kg/m.    Physical Exam  Constitutional: She is oriented to person, place, and time. Vital signs are normal. She appears well-developed and well-nourished.  HENT:  Head: Normocephalic and atraumatic.  Neck: Normal range of motion.  Cardiovascular: Normal rate, regular rhythm, normal  heart sounds and intact distal pulses.  Pulmonary/Chest: Effort normal and breath sounds normal. No accessory muscle usage. No respiratory distress.  Abdominal: Soft. Bowel sounds are normal.  Musculoskeletal: Normal range of motion.  Neurological: She is alert and oriented to person, place, and time.  Skin: Skin is warm and dry.  Vitals reviewed.  Radiology:  CTA Abdomen and Pelvis 04/02/2017: 1. No acute findings. 2. Descending and sigmoid diverticulosis 3. Cholelithiasis 4. Lumbar spondylitic changes 5.  Aortic Atherosclerosis (ICD10-170.0)  Laboratory examination:    CMP Latest Ref Rng & Units 11/07/2018 07/07/2018 01/24/2018  Glucose 65 - 99 mg/dL 106(H) 92 94  BUN 8 - 27 mg/dL _1 Creatinine 0.57 - 1.00 mg/dL 0.90 0.90 0.87  Sodium 134 - 144 mmol/L 142 142 145(H)  Potassium 3.5 - 5.2 mmol/L 4.3 4.2 4.4  Chloride 96 - 106 mmol/L 103 103 103  CO2 20 - 29 mmol/L _0 Calcium 8.7 - 10.3 mg/dL 9.8 9.5 9.9  Total Protein 6.0 - 8.5 g/dL - 6.6 6.9  Total Bilirubin 0.0 - 1.2 mg/dL - 0.5 0.6  Alkaline Phos 39 - 117 IU/L - 52 55  AST 0 - 40 IU/L - 24 29  ALT 0 - 32 IU/L - 15 17   CBC Latest Ref Rng & Units 01/24/2018 04/02/2017 06/25/2016  WBC 3.4 - 10.8 x10E3/uL 4.6 5.6 3.9(L)  Hemoglobin 11.1 - 15.9 g/dL 13.0 12.9 12.5  Hematocrit 34.0 - 46.6 % 38.5 37.7 37.3  Platelets 150 - 450 x10E3/uL 164 143(L) 172   Lipid Panel     Component Value Date/Time   CHOL 173 07/07/2018 0920   TRIG 92 07/07/2018 0920   HDL 77 07/07/2018 0920   CHOLHDL 2.2 07/07/2018 0920   CHOLHDL 2.8 06/25/2016 0953   VLDL 18 06/25/2016 0953   LDLCALC 78 07/07/2018 0920   LDLDIRECT 140.1 03/01/2009 0000   HEMOGLOBIN A1C Lab Results  Component Value Date   HGBA1C 5.4 05/23/2015   MPG 108 05/23/2015   TSH Recent Labs    07/07/18 0920  TSH 1.700    PRN Meds:. Medications Discontinued During This Encounter  Medication Reason  . traMADol (ULTRAM) 50 MG tablet    Current Meds   Medication Sig  . acetaminophen (TYLENOL) 500 MG tablet 1-2 tablets twice to three times daily as needed.  Marland Kitchen albuterol (PROVENTIL HFA;VENTOLIN HFA) 108 (90 Base) MCG/ACT inhaler Inhale 2 puffs into the lungs as needed.  Marland Kitchen alosetron (LOTRONEX) 0.5 MG tablet TAKE TWO TABLETS TWICE DAILY  . aspirin EC 81 MG tablet Take 1 tablet (81 mg total) by mouth daily.  Marland Kitchen atenolol (TENORMIN) 25 MG tablet TAKE ONE TABLET EACH DAY  . atorvastatin (LIPITOR) 20 MG tablet Take 1 tablet (20 mg total) by mouth daily. (Patient taking differently: Take 20 mg by mouth 3 (three) times a week. )  . cetirizine (ZYRTEC) 10 MG tablet Take 10 mg by mouth daily.  . cholecalciferol (VITAMIN D3) 25 MCG (1000 UT) tablet Take 1 tablet (1,000 Units total) by mouth daily.  . Fluticasone-Salmeterol (ADVAIR DISKUS) 250-50 MCG/DOSE AEPB Inhale 1 puff into the lungs 2 (two) times daily.  . hydrochlorothiazide (MICROZIDE) 12.5 MG capsule TAKE ONE CAPSULE EACH DAY  . metoCLOPramide (REGLAN) 5 MG tablet TAKE ONE TABLET TWICE DAILY (Patient taking differently: as needed. )  . Multiple Vitamin (MULTIVITAMIN) capsule Take 1 capsule by mouth daily.  . ondansetron (ZOFRAN) 4 MG tablet TAKE ONE TABLET EVERY EIGHT HOURS AS NEEDED FOR NAUSEA AND VOMITING -TO PREVENT DIARRHEA MAY USE 2 TABLETS  . Probiotic Product (PRO-BIOTIC BLEND PO) Take 1 tablet by mouth daily.  Marland Kitchen triamcinolone cream (KENALOG) 0.1 % Apply 1 application topically 2 (two) times daily.  Marland Kitchen zolpidem (AMBIEN) 10 MG tablet TAKE 1/2 TO 1 TABLET AT BEDTIME   Current Facility-Administered Medications for the 12/21/18 encounter (Office Visit) with Miquel Dunn, NP  Medication  . 0.9 %  sodium chloride infusion  . 0.9 %  sodium chloride infusion  . albuterol (PROVENTIL) (5 MG/ML) 0.5% nebulizer solution 2.5 mg    Cardiac Studies:   Coronary CTA 11/16/2018:  1. Coronary calcium score of 13. This was 24th percentile for age and sex matched control. This is noted  in the  proximal and mid LAD.  2. Normal coronary origin with right dominance.  3. Minimal calcified plaque in the prox LAD and ostial RCA with no significant stenosis.  4. Aggressive medical therapy is recommended.  Echocardiogram 10/28/2018: Mildly depressed LV systolic function with visual EF 45-50%. Left ventricle cavity is normal in size. Likely mild hypertensive heart disease. Normal global wall motion. Doppler evidence of grade I (impaired) diastolic dysfunction, normal LAP.  Mild (Grade I) mitral regurgitation. Mild tricuspid regurgitation.  No evidence of pulmonary hypertension.  Assessment:     ICD-10-CM   1. Dyspnea on exertion  R06.09 PCV ECHOCARDIOGRAM COMPLETE  2. Essential hypertension  I10   3. Abnormal EKG  R94.31   4. Mild hyperlipidemia  E78.5     EKG 10/05/2018: Sinus bradycardia at 59 bpm, left anterior fasicular block, nonspecific ST-T wave abnormality.   Recommendations:   I discussed recently obtained CTA results.  Had minimal stenosis in RCA and LAD of 0 to 25%.  I recommended controlling her risk factors.  Currently lipids are controlled with taking Lipitor 3 times a week.  Recommended that she continue with this.  Do not suspect that these findings are contributing to her symptoms.  She is scheduled to undergo PFTs with PCP, which I agree with.  She does have mildly depressed LVEF, unsure of etiology for this as she does not have significant history of hypertension. In fact she reports blood pressures generally on the low side.  She does admit to 15 pound weight gain over the last year and has not been nearly as active.  This could potentially be causing her dyspnea.  I have recommended repeating her echocardiogram in 1 year.  We will plan to see her back as well at that time.  Encouraged her to contact me sooner if symptoms do not improve or worsen.    Miquel Dunn, MSN, APRN, FNP-C [facility]. [place] Office: [phone] Fax: [phone]

## 2018-12-23 ENCOUNTER — Ambulatory Visit (AMBULATORY_SURGERY_CENTER): Payer: Self-pay

## 2018-12-23 ENCOUNTER — Other Ambulatory Visit: Payer: Self-pay

## 2018-12-23 VITALS — Ht 68.0 in | Wt 155.0 lb

## 2018-12-23 DIAGNOSIS — Z86010 Personal history of colonic polyps: Secondary | ICD-10-CM

## 2018-12-23 MED ORDER — PLENVU 140 G PO SOLR: 1.0000 | Freq: Once | ORAL | 0 refills | Status: AC

## 2018-12-23 NOTE — Progress Notes (Signed)
Denies allergies to eggs or soy products. Denies complication of anesthesia or sedation. Denies use of weight loss medication. Denies use of O2.   Emmi instructions given for colonoscopy.  Patient states that she was not totally cleaned out for her last colonoscopy and Dr. Carlean Purl suggested that she use a different prep. A sample of Plenvu was given to the patient. Pre-Visit was conducted by phone due to Covid 19.  Patient will pick up instructions and prep sample on Monday 12/26/18. Patient was encouraged to call if she had any questions regarding instructions.

## 2019-01-01 ENCOUNTER — Other Ambulatory Visit: Payer: Self-pay

## 2019-01-01 ENCOUNTER — Ambulatory Visit (HOSPITAL_COMMUNITY)
Admission: EM | Admit: 2019-01-01 | Discharge: 2019-01-01 | Disposition: A | Discharge status: Home / Self Care | Payer: PPO | Attending: Family Medicine | Admitting: Family Medicine

## 2019-01-01 ENCOUNTER — Encounter (HOSPITAL_COMMUNITY): Payer: Self-pay

## 2019-01-01 DIAGNOSIS — Z1159 Encounter for screening for other viral diseases: Secondary | ICD-10-CM | POA: Diagnosis not present

## 2019-01-01 DIAGNOSIS — J988 Other specified respiratory disorders: Secondary | ICD-10-CM | POA: Diagnosis not present

## 2019-01-01 DIAGNOSIS — Z20828 Contact with and (suspected) exposure to other viral communicable diseases: Secondary | ICD-10-CM | POA: Diagnosis not present

## 2019-01-01 DIAGNOSIS — Z139 Encounter for screening, unspecified: Secondary | ICD-10-CM | POA: Insufficient documentation

## 2019-01-01 DIAGNOSIS — U071 COVID-19: Secondary | ICD-10-CM | POA: Diagnosis present

## 2019-01-01 DIAGNOSIS — Z7184 Encounter for health counseling related to travel: Secondary | ICD-10-CM | POA: Diagnosis not present

## 2019-01-01 NOTE — ED Triage Notes (Signed)
Pt cc she needs a Covid test so she can fly out to see her daughter.

## 2019-01-01 NOTE — ED Provider Notes (Addendum)
[facility]    CSN: [id number] Arrival date & time: 01/01/19  1422      History   Chief Complaint Chief Complaint  Patient presents with  . covid testing    HPI Shelby Randolph is a 68 y.o. female history of hypertension, anxiety presenting for COVID testing.  Patient states that her daughter fell from a tree a few weeks ago, broke her neck as well as upper extremities.  Patient was unable to visit her daughter due to COVID restrictions with patient being in the ICU, the patient will be discharged by the time patient intends on arriving: Tuesday evening.  Patient has been working from home, denies known [place] contacts.  She has been asymptomatic, afebrile for the last month.   Past Medical History:  Diagnosis Date  . Adenomatous colon polyp   . Allergy   . Anemia   . Anxiety   . Arthritis   . Asthma   . Atherosclerosis of aorta ([place]) 03/2014   abnormal on ultrasound, repeat US within 5 years  . Cancer ([place])    Skin cancer squamus   . Depression   . Diverticulosis   . Eye injury    in college, some pertinent loss of vision in left eye  . Genital herpes   . GERD (gastroesophageal reflux disease)   . History of bone density study 09/2011   normal  . History of mammogram    Dr. Radene Knee  . History of melanoma    sees [facility], dermatology  . History of PFTs 01/20/2011   moderate airflow limitation, no significant response to bronchodilator  . Hyperlipidemia   . Hypertension 12/2011  . Insomnia   . Migraines    Dr. Melton Alar, migraines  . MSH6-related Lynch syndrome (HNPCC5) 08/20/2017  . Nearsightedness    wears glasses  . Osteoporosis   . Post-operative nausea and vomiting   . Routine gynecological examination    Dr. Radene Knee  . 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 Post-menopausal 07/07/2018  . Estrogen deficiency 07/07/2018  . Vaccine counseling 07/07/2018  . Need for pneumococcal vaccination  07/07/2018  . Osteopenia 07/07/2018  . Skin lesion 07/07/2018  . Leg cramping 01/24/2018  . Varicose veins of both lower extremities 01/24/2018  . Monoallelic mutation of MSH6 gene 09/14/2017  . Lynch syndrome 08/20/2017  . Toenail deformity 07/06/2017  . Medicare annual wellness visit, subsequent 06/25/2016  . Left hip pain 06/25/2016  . Encounter for health maintenance examination in adult 05/23/2015  . Generalized anxiety disorder 05/23/2015  . Vitamin D deficiency 05/23/2015  . IBS (irritable bowel syndrome) 06/11/2014  . Gallstones 04/12/2014  . Dyslipidemia 04/12/2014  . Essential hypertension 04/12/2014  . Asthma, mild intermittent 04/12/2014  . Insomnia 04/12/2014  . Genital herpes 04/12/2014  . Chronic nausea 04/12/2014  . History of colonic polyps 04/12/2014  . Rhinitis, allergic 04/12/2014  . Atherosclerosis of abdominal aorta ([place]) 04/12/2014    Past Surgical History:  Procedure Laterality Date  . COLONOSCOPY  2016   Dr. Silvano Rusk, 2014 Dr. Earlean Shawl  . ESOPHAGOGASTRODUODENOSCOPY    . ROTATOR CUFF REPAIR Right 05/2010  . TONSILLECTOMY AND ADENOIDECTOMY     as a kid  . TUBAL LIGATION  1985  . VAGINAL HYSTERECTOMY  1998   total; abnormal peroids  . VARICOSE VEIN SURGERY     bilat    OB History   No obstetric history on file.  Home Medications    Prior to Admission medications   Medication Sig Start Date End Date Taking? Authorizing Provider  acetaminophen (TYLENOL) 500 MG tablet 1-2 tablets twice to three times daily as needed. 02/03/12   Tysinger, Camelia Eng, PA-C  albuterol (PROVENTIL HFA;VENTOLIN HFA) 108 (90 Base) MCG/ACT inhaler Inhale 2 puffs into the lungs as needed. 06/03/17   Tysinger, Camelia Eng, PA-C  alosetron (LOTRONEX) 0.5 MG tablet TAKE TWO TABLETS TWICE DAILY 10/07/18   Gatha Mayer, MD  aspirin EC 81 MG tablet Take 1 tablet (81 mg total) by mouth daily. 07/08/18   Tysinger, Camelia Eng, PA-C  atenolol (TENORMIN) 25 MG tablet TAKE ONE TABLET  EACH DAY 07/08/18   Tysinger, Camelia Eng, PA-C  atorvastatin (LIPITOR) 20 MG tablet Take 1 tablet (20 mg total) by mouth daily. Patient taking differently: Take 20 mg by mouth 3 (three) times a week.  07/08/18 07/08/19  Tysinger, Camelia Eng, PA-C  cetirizine (ZYRTEC) 10 MG tablet Take 10 mg by mouth daily.    [provider]  cholecalciferol (VITAMIN D3) 25 MCG (1000 UT) tablet Take 1 tablet (1,000 Units total) by mouth daily. 07/08/18   Tysinger, Camelia Eng, PA-C  Fluticasone-Salmeterol (ADVAIR DISKUS) 250-50 MCG/DOSE AEPB Inhale 1 puff into the lungs 2 (two) times daily. 11/23/18 11/23/19  Tysinger, Camelia Eng, PA-C  hydrochlorothiazide (MICROZIDE) 12.5 MG capsule TAKE ONE CAPSULE EACH DAY 07/08/18   Tysinger, Camelia Eng, PA-C  metoCLOPramide (REGLAN) 5 MG tablet TAKE ONE TABLET TWICE DAILY Patient taking differently: as needed.  10/12/17   Tysinger, Camelia Eng, PA-C  Multiple Vitamin (MULTIVITAMIN) capsule Take 1 capsule by mouth daily.    [provider]  ondansetron (ZOFRAN) 4 MG tablet TAKE ONE TABLET EVERY EIGHT HOURS AS NEEDED FOR NAUSEA AND VOMITING -TO PREVENT DIARRHEA MAY USE 2 TABLETS 10/21/18   Gatha Mayer, MD  Probiotic Product (PRO-BIOTIC BLEND PO) Take 1 tablet by mouth daily.    [provider]  triamcinolone cream (KENALOG) 0.1 % Apply 1 application topically 2 (two) times daily. 11/06/16   Tysinger, Camelia Eng, PA-C  zolpidem (AMBIEN) 10 MG tablet TAKE 1/2 TO 1 TABLET AT BEDTIME 10/13/18   Tysinger, Camelia Eng, PA-C    Family History Family History  Problem Relation Age of Onset  . COPD Mother        died age 38yo.  emphysema  . Colon cancer Father 36        died age 5yo  . Colon cancer Brother 41  . Breast cancer Maternal Aunt   . Colon cancer Paternal Uncle        2  . Breast cancer Daughter   . Colon cancer Paternal Grandmother 61       colon cancer   . Diabetes Neg Hx   . Heart disease Neg Hx   . Hypertension Neg Hx   . Stroke Neg Hx   . Rectal cancer Neg Hx   .  Stomach cancer Neg Hx     Social History Social History   Tobacco Use  . Smoking status: Never Smoker  . Smokeless tobacco: Never Used  Substance Use Topics  . Alcohol use: Yes    Alcohol/week: 2.0 standard drinks    Types: 2 Glasses of wine per week  . Drug use: No     Allergies   Sulfonamide derivatives and Mucinex [guaifenesin er]   Review of Systems Review of Systems  Constitutional: Negative for fatigue and fever.  HENT: Negative for ear  pain, sinus pain, sore throat and voice change.   Eyes: Negative for pain, redness and visual disturbance.  Respiratory: Negative for cough and shortness of breath.   Cardiovascular: Negative for chest pain and palpitations.  Gastrointestinal: Negative for abdominal pain, diarrhea and vomiting.  Musculoskeletal: Negative for arthralgias and myalgias.  Skin: Negative for rash and wound.  Neurological: Negative for syncope and headaches.     Physical Exam Triage Vital Signs ED Triage Vitals [01/01/19 1619]  Enc Vitals Group     BP 133/69     Pulse Rate 70     Resp 18     Temp 97.8 F (36.6 C)     Temp Source Oral     SpO2 100 %     Weight 158 lb (71.7 kg)     Height      Head Circumference      Peak Flow      Pain Score      Pain Loc      Pain Edu?      Excl. in [place]?    No data found.  Updated Vital Signs BP 133/69 (BP Location: Right Arm)   Pulse 70   Temp 97.8 F (36.6 C) (Oral)   Resp 18   Wt 158 lb (71.7 kg)   SpO2 100%   BMI 24.02 kg/m   Visual Acuity Right Eye Distance:   Left Eye Distance:   Bilateral Distance:    Right Eye Near:   Left Eye Near:    Bilateral Near:     Physical Exam Constitutional:      General: She is not in acute distress. HENT:     Head: Normocephalic and atraumatic.  Eyes:     General: No scleral icterus.    Pupils: Pupils are equal, round, and reactive to light.  Cardiovascular:     Rate and Rhythm: Normal rate.  Pulmonary:     Effort: Pulmonary effort is normal.   Skin:    Coloration: Skin is not jaundiced or pale.  Neurological:     Mental Status: She is alert and oriented to person, place, and time.      UC Treatments / Results  Labs (all labs ordered are listed, but only abnormal results are displayed) Labs Reviewed  NOVEL CORONAVIRUS, NAA (HOSPITAL ORDER, SEND-OUT TO REF LAB)    EKG   Radiology No results found.  Procedures Procedures (including critical care time)  Medications Ordered in UC Medications - No data to display  Initial Impression / Assessment and Plan / UC Course  I have reviewed the triage vital signs and the nursing notes.  Pertinent labs & imaging results that were available during my care of the patient were reviewed by me and considered in my medical decision making (see chart for details).     1.  Encounter for screening for COVID Covid test pending, patient should be safe to travel given lack of symptoms, fever contacts: Note provided.  Reviewed current CDC guidelines for the state of [place] as listed below patient who verbalized understanding.  Return precautions discussed, patient verbalized understanding and is agreeable to plan. Final Clinical Impressions(s) / UC Diagnoses   Final diagnoses:  Encounter for screening for other viral diseases  Encounter for counseling for travel     Discharge Instructions     The State will allow adults who have a negative COVID-19 PCR test collected no more than 72 hours before arriving in [place] to forgo the 14-day quarantine upon arrival  in [place]. A non-PCR test, such as an antigen or antibody test, is not accepted to forgo quarantine for visitors and those returning to [place]    ED Prescriptions    None     Controlled Substance Prescriptions [place] Controlled Substance Registry consulted? Not Applicable   Quincy Sheehan, PA-C 01/01/19 1644    [place], [place], [place] 01/11/19 1358

## 2019-01-01 NOTE — Discharge Instructions (Signed)
The State will allow adults who have a negative COVID-19 PCR test collected no more than 72 hours before arriving in [place] to forgo the 14-day quarantine upon arrival in [place]. A non-PCR test, such as an antigen or antibody test, is not accepted to forgo quarantine for visitors and those returning to [place]

## 2019-01-05 LAB — NOVEL CORONAVIRUS, NAA (HOSP ORDER, SEND-OUT TO REF LAB; TAT 18-24 HRS): SARS-CoV-2, NAA: NOT DETECTED

## 2019-01-06 ENCOUNTER — Encounter (HOSPITAL_COMMUNITY): Payer: Self-pay

## 2019-01-06 ENCOUNTER — Encounter: Payer: PPO | Admitting: Internal Medicine

## 2019-01-07 IMAGING — CR DG HAND COMPLETE 3+V*R*
3 series · 3 of 3 positions shown · non-contrast
Comparison: None.

CLINICAL DATA: Fall 1 month ago. Right hand pain at 2nd MCP joint
and 2nd metacarpal

EXAM:
RIGHT HAND - COMPLETE 3+ VIEW

[x hand pa right]
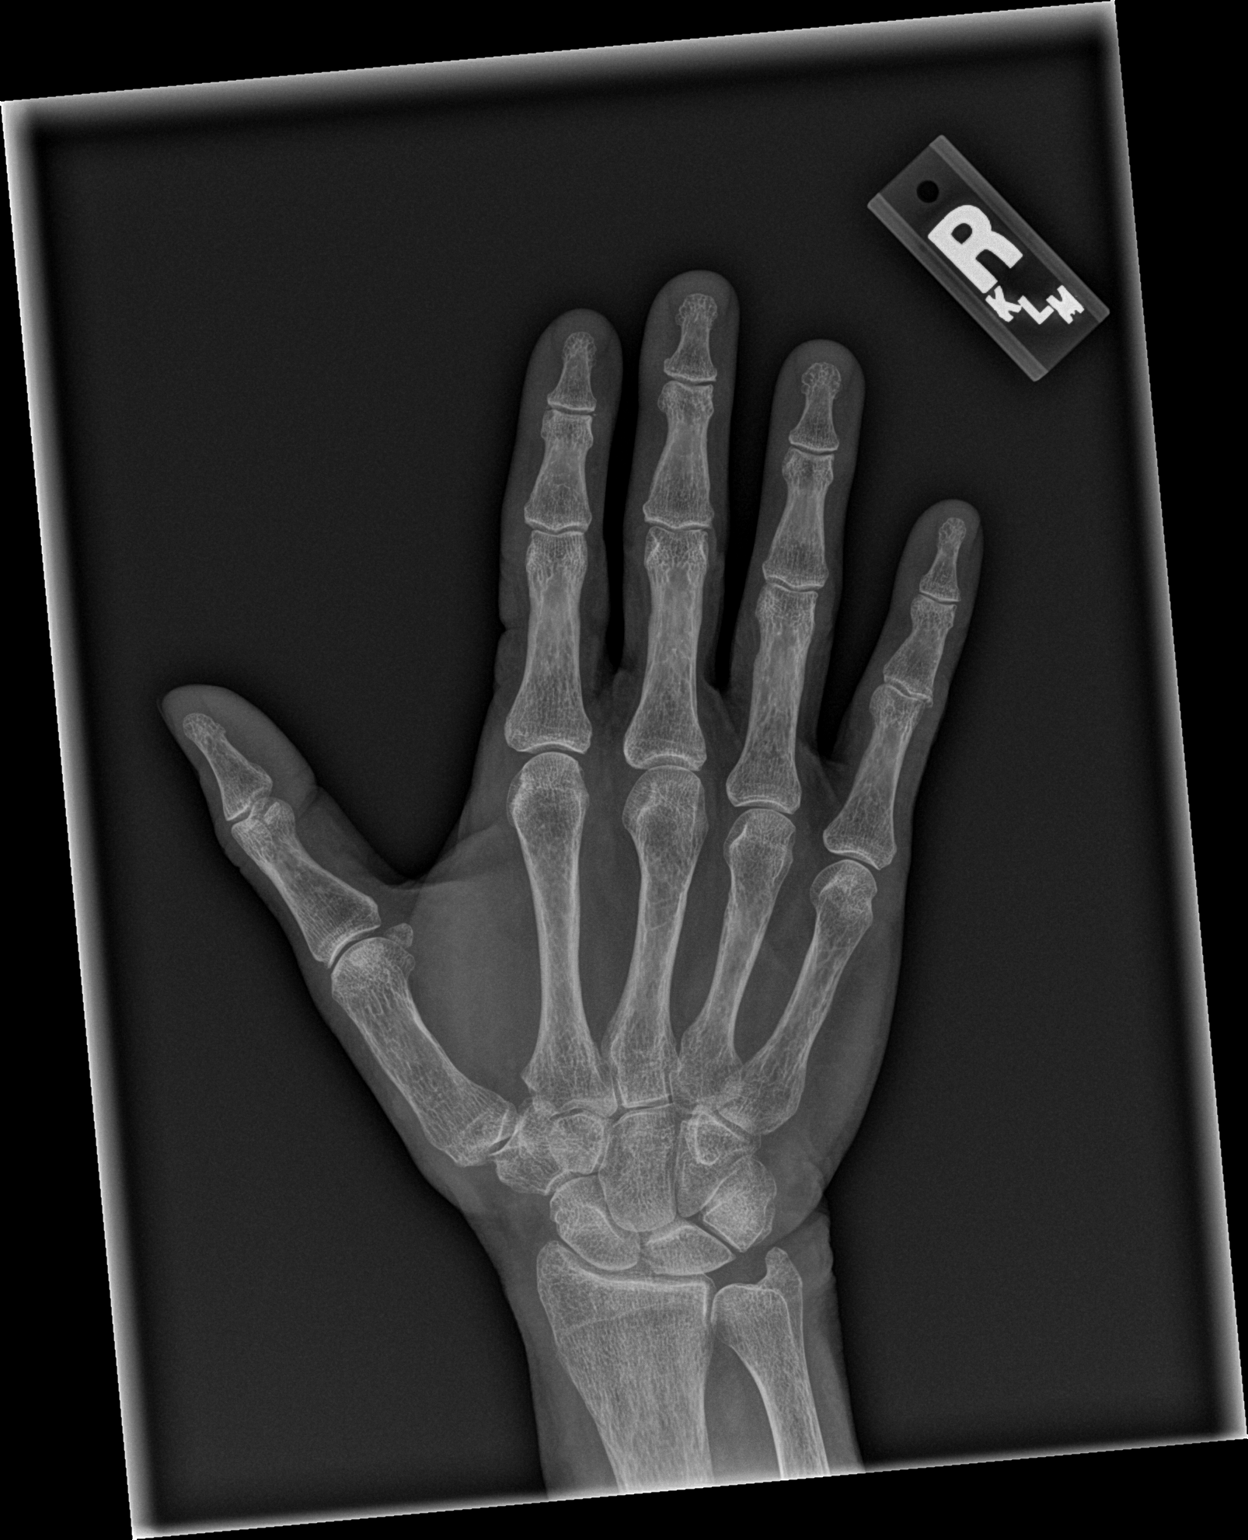

[x hand obl right]
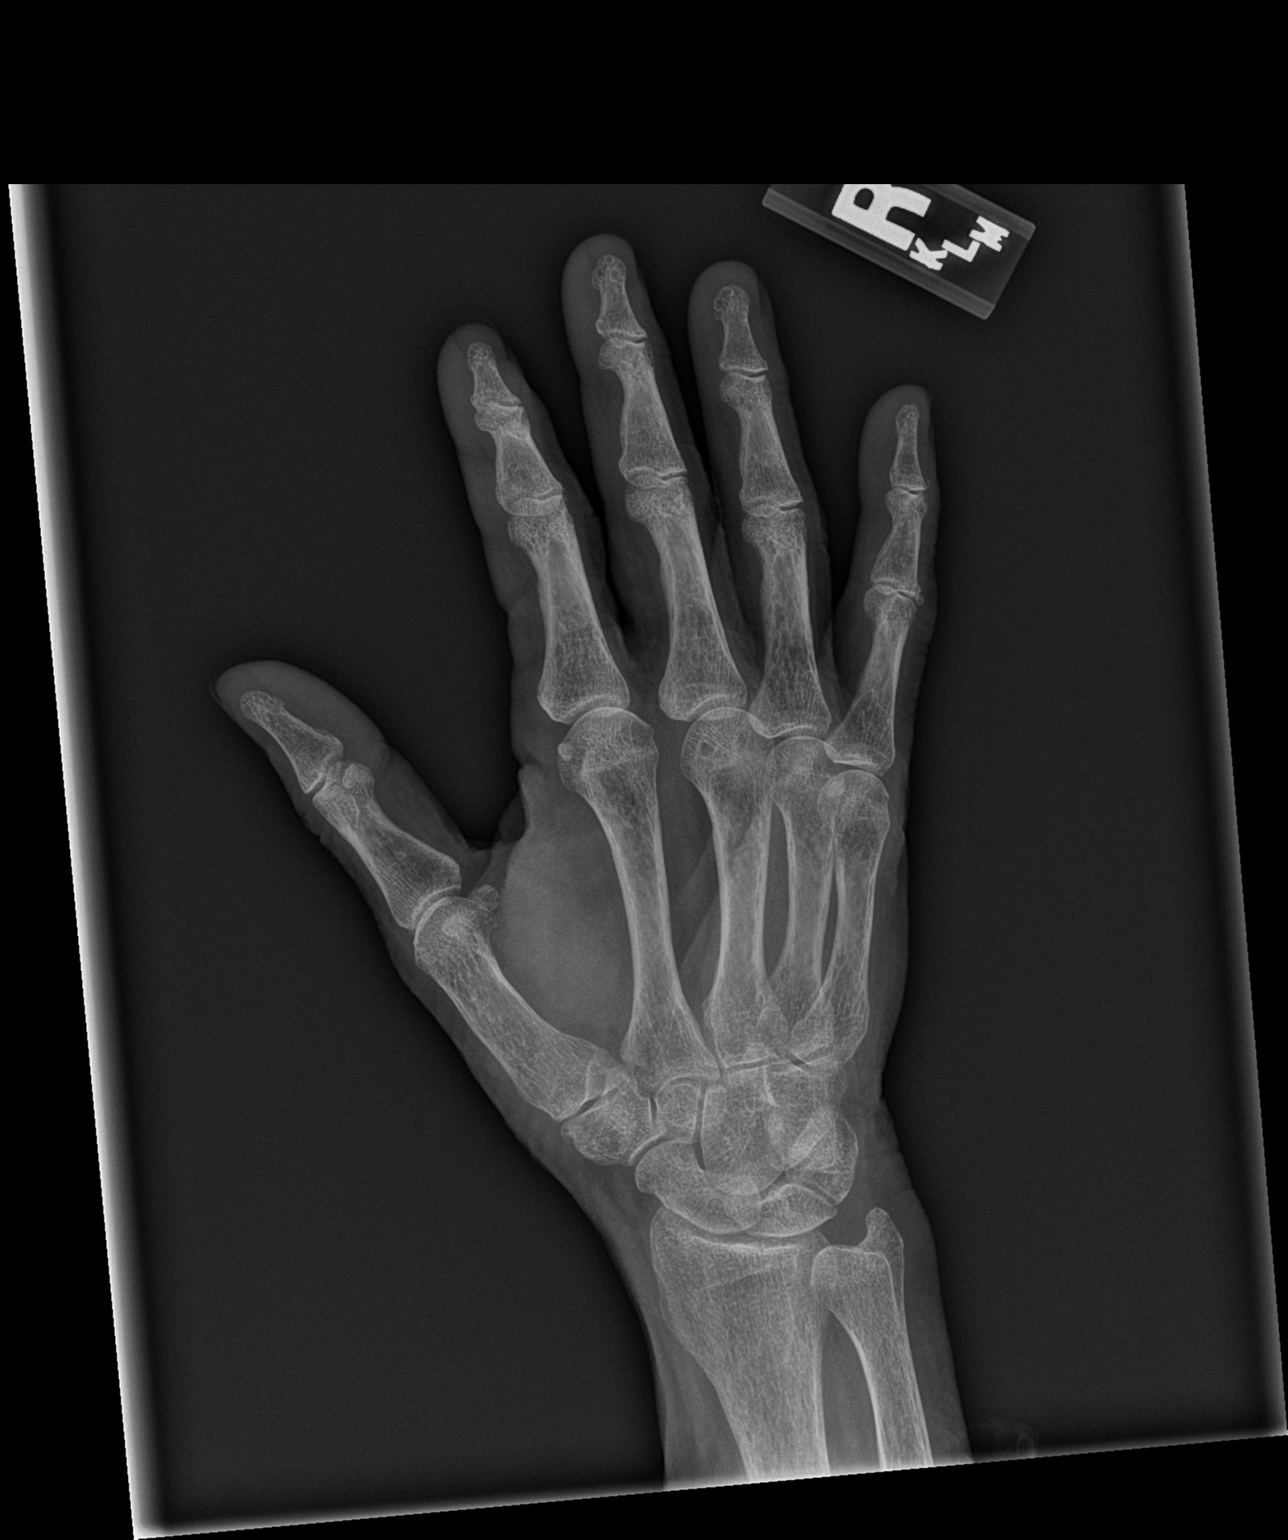

[x hand lat right]
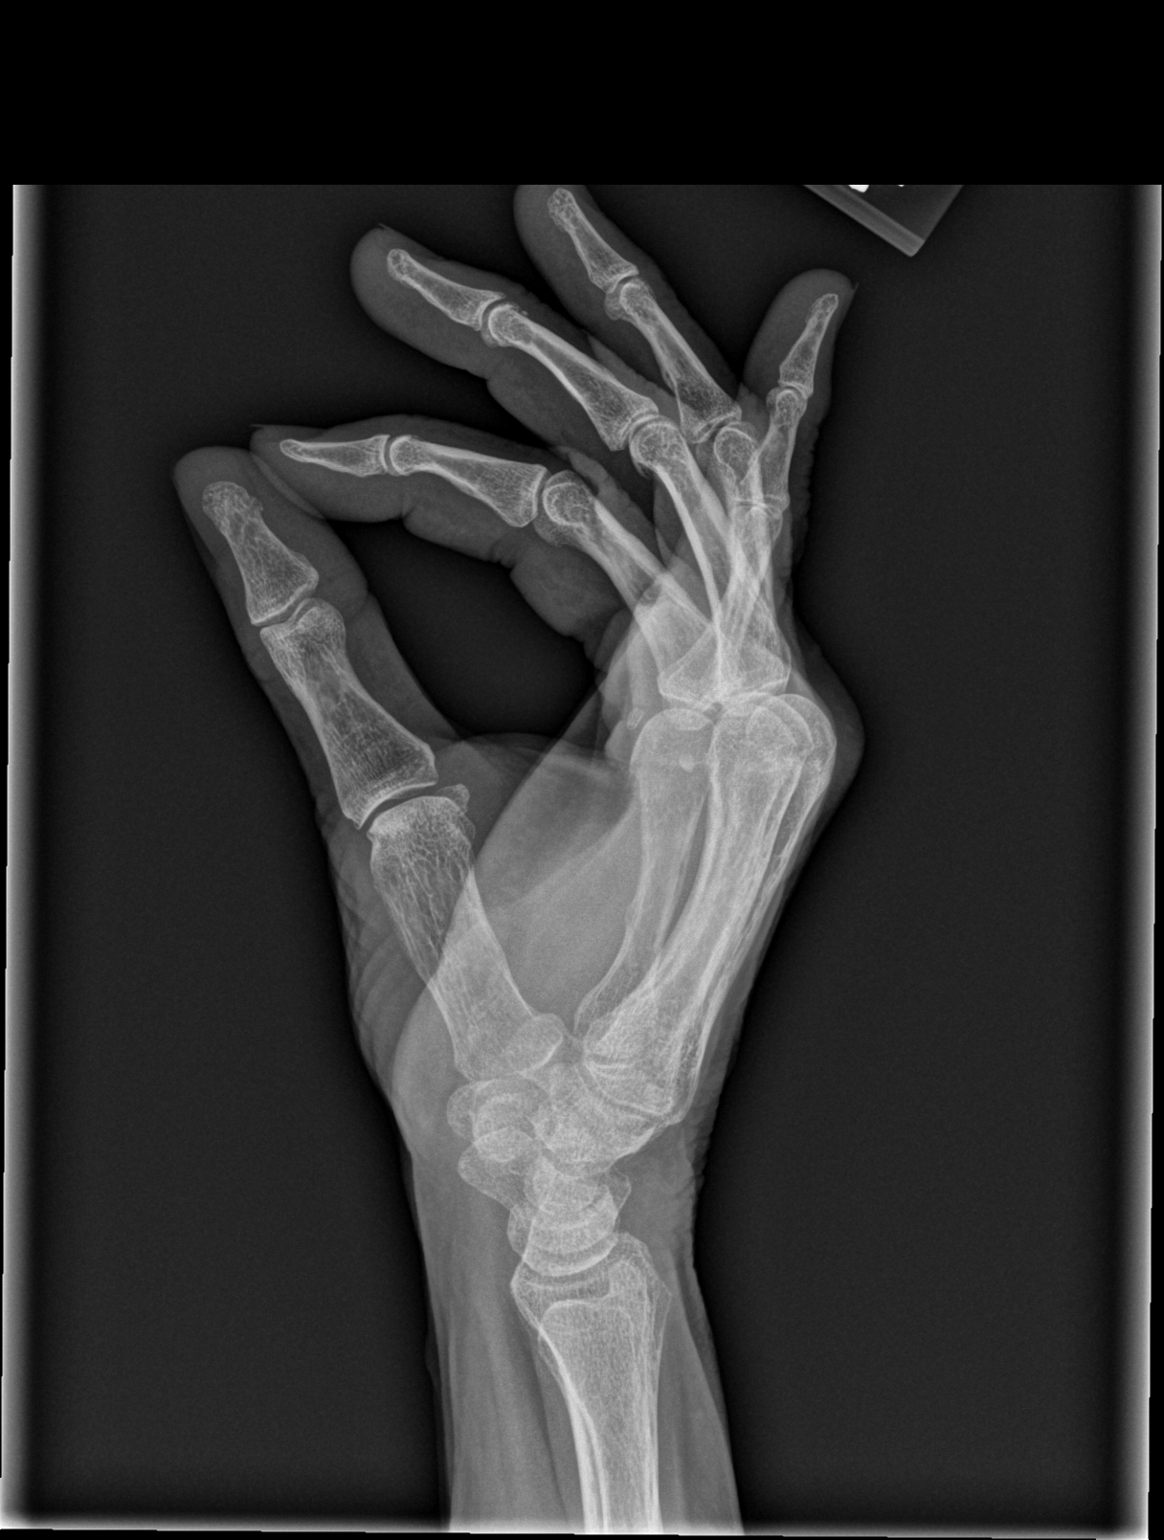

[3 of 3 positions shown; findings below may reference images not displayed]

FINDINGS: There is no evidence of fracture or dislocation. There is no
evidence of arthropathy or other focal bone abnormality. Soft
tissues are unremarkable.
IMPRESSION: Negative.

## 2019-01-10 HISTORY — PX: COLONOSCOPY: SHX174

## 2019-01-14 ENCOUNTER — Other Ambulatory Visit: Payer: Self-pay | Admitting: Medical

## 2019-01-19 ENCOUNTER — Encounter: Payer: Self-pay | Admitting: Neonatology

## 2019-02-03 ENCOUNTER — Telehealth: Payer: Self-pay | Admitting: Internal Medicine

## 2019-02-03 NOTE — Telephone Encounter (Signed)
Left message to call back for Covid - 19 questions:   Do you now or have you had a fever in the last 14 days?       Do you have any respiratory symptoms of shortness of breath or cough now or in the last 14 days?      Do you have any family members or close contacts with diagnosed or suspected Covid-19 in the past 14 days?     Have you been tested for Covid-19 and found to be positive?     Made pt aware that care partner may come to the lobby during the procedure but will need to provide their own mask

## 2019-02-03 NOTE — Telephone Encounter (Signed)
Pt returned call and answered “No” to all questions.  °  °Pt made aware of that care partner may come to the lobby during the procedure but will need to provide their own mask. ° ° °

## 2019-02-06 ENCOUNTER — Encounter: Payer: Self-pay | Admitting: Internal Medicine

## 2019-02-06 ENCOUNTER — Other Ambulatory Visit: Payer: Self-pay | Admitting: Internal Medicine

## 2019-02-06 ENCOUNTER — Ambulatory Visit (AMBULATORY_SURGERY_CENTER): Payer: PPO | Admitting: Internal Medicine

## 2019-02-06 ENCOUNTER — Other Ambulatory Visit: Payer: Self-pay

## 2019-02-06 VITALS — BP 134/68 | HR 60 | Temp 97.8°F | Resp 16 | Ht 68.0 in | Wt 155.0 lb

## 2019-02-06 DIAGNOSIS — Z1509 Genetic susceptibility to other malignant neoplasm: Secondary | ICD-10-CM | POA: Diagnosis not present

## 2019-02-06 DIAGNOSIS — Z86010 Personal history of colonic polyps: Secondary | ICD-10-CM | POA: Diagnosis not present

## 2019-02-06 DIAGNOSIS — D120 Benign neoplasm of cecum: Secondary | ICD-10-CM

## 2019-02-06 DIAGNOSIS — Z1211 Encounter for screening for malignant neoplasm of colon: Secondary | ICD-10-CM | POA: Diagnosis not present

## 2019-02-06 MED ORDER — SODIUM CHLORIDE 0.9 % IV SOLN: 500.0000 mL | Freq: Once | INTRAVENOUS | Status: DC

## 2019-02-06 NOTE — Patient Instructions (Addendum)
Only 1 tiny polyp - I removed it.  Your anal sphincter and pelvic floor muscles appear weak and I think that may be part of your bowel issues.  I will refer you for pelvic floor PT as we discussed.  Will need a colonoscopy and EGD next year.  I appreciate the opportunity to care for you. Gatha Mayer, MD, FACG YOU HAD AN ENDOSCOPIC PROCEDURE TODAY AT [facility]:   Refer to the procedure report that was given to you for any specific questions about what was found during the examination.  If the procedure report does not answer your questions, please call your gastroenterologist to clarify.  If you requested that your care partner not be given the details of your procedure findings, then the procedure report has been included in a sealed envelope for you to review at your convenience later.  YOU SHOULD EXPECT: Some feelings of bloating in the abdomen. Passage of more gas than usual.  Walking can help get rid of the air that was put into your GI tract during the procedure and reduce the bloating. If you had a lower endoscopy (such as a colonoscopy or flexible sigmoidoscopy) you may notice spotting of blood in your stool or on the toilet paper. If you underwent a bowel prep for your procedure, you may not have a normal bowel movement for a few days.  Please Note:  You might notice some irritation and congestion in your nose or some drainage.  This is from the oxygen used during your procedure.  There is no need for concern and it should clear up in a day or so.  SYMPTOMS TO REPORT IMMEDIATELY:   Following lower endoscopy (colonoscopy or flexible sigmoidoscopy):  Excessive amounts of blood in the stool  Significant tenderness or worsening of abdominal pains  Swelling of the abdomen that is new, acute  Fever of 100F or higher   For urgent or emergent issues, a gastroenterologist can be reached at any hour by calling [phone].   DIET:  We do recommend a small meal at  first, but then you may proceed to your regular diet.  Drink plenty of fluids but you should avoid alcoholic beverages for 24 hours.  MEDICATIONS: Continue present medications.  Please see handouts given to you by your recovery nurse.  ACTIVITY:  You should plan to take it easy for the rest of today and you should NOT DRIVE or use heavy machinery until tomorrow (because of the sedation medicines used during the test).    FOLLOW UP: Our staff will call the number listed on your records 48-72 hours following your procedure to check on you and address any questions or concerns that you may have regarding the information given to you following your procedure. If we do not reach you, we will leave a message.  We will attempt to reach you two times.  During this call, we will ask if you have developed any symptoms of COVID 19. If you develop any symptoms (ie: fever, flu-like symptoms, shortness of breath, cough etc.) before then, please call [phone].  If you test positive for Covid 19 in the 2 weeks post procedure, please call and report this information to [place].    If any biopsies were taken you will be contacted by phone or by letter within the next 1-3 weeks.  Please call us at [phone] if you have not heard about the biopsies in 3 weeks.   Thank you for allowing [place]  to provide for your healthcare needs today.   SIGNATURES/CONFIDENTIALITY: You and/or your care partner have signed paperwork which will be entered into your electronic medical record.  These signatures attest to the fact that that the information above on your After Visit Summary has been reviewed and is understood.  Full responsibility of the confidentiality of this discharge information lies with you and/or your care-partner.

## 2019-02-06 NOTE — Progress Notes (Signed)
Called to room to assist during endoscopic procedure.  Patient ID and intended procedure confirmed with present staff. Received instructions for my participation in the procedure from the performing physician.  

## 2019-02-06 NOTE — Progress Notes (Signed)
KA - Temp CW- VS  Pt's states no medical or surgical changes since previsit or office visit.   

## 2019-02-06 NOTE — Progress Notes (Signed)
A and O x3. Report to RN. Tolerated MAC anesthesia well.

## 2019-02-06 NOTE — Op Note (Signed)
[facility] Patient Name: Jacques Nicolay Procedure Date: 02/06/2019 9:36 AM MRN: [id number] Endoscopist: Gatha Mayer , MD Age: 68 Referring MD:  Date of Birth: [birth year] Gender: Female Account #: [id number] Procedure:                Colonoscopy Indications:              Lynch Syndrome, Personal history of colonic polyps Medicines:                Propofol per Anesthesia, Monitored Anesthesia Care Procedure:                Pre-Anesthesia Assessment:                           - Prior to the procedure, a History and Physical                            was performed, and patient medications and                            allergies were reviewed. The patient's tolerance of                            previous anesthesia was also reviewed. The risks                            and benefits of the procedure and the sedation                            options and risks were discussed with the patient.                            All questions were answered, and informed consent                            was obtained. Prior Anticoagulants: The patient has                            taken no previous anticoagulant or antiplatelet                            agents. ASA Grade Assessment: II - A patient with                            mild systemic disease. After reviewing the risks                            and benefits, the patient was deemed in                            satisfactory condition to undergo the procedure.                           After obtaining informed consent, the colonoscope  was passed under direct vision. Throughout the                            procedure, the patient's blood pressure, pulse, and                            oxygen saturations were monitored continuously. The                            Colonoscope was introduced through the anus and                            advanced to the the cecum, identified by        appendiceal orifice and ileocecal valve. The                            colonoscopy was performed without difficulty. The                            patient tolerated the procedure well. The quality                            of the bowel preparation was excellent. The bowel                            preparation used was Miralax via split dose                            instruction. The ileocecal valve, appendiceal                            orifice, and rectum were photographed. Scope In: 9:49:01 AM Scope Out: 10:05:09 AM Scope Withdrawal Time: 0 hours 10 minutes 12 seconds  Total Procedure Duration: 0 hours 16 minutes 8 seconds  Findings:                 The perianal examination was normal.                           The digital rectal exam findings include weak                            resting and voluntary anal sphincter tone, excess                            internal rectal descent, and a moderate rectocele.                           A diminutive polyp was found in the cecum. The                            polyp was sessile. The polyp was removed with a  cold snare. Resection and retrieval were complete.                            Verification of patient identification for the                            specimen was done. Estimated blood loss was minimal.                           Scattered diverticula were found in the entire                            colon.                           The exam was otherwise without abnormality on                            direct and retroflexion views. Complications:            No immediate complications. Estimated Blood Loss:     Estimated blood loss was minimal. Impression:               - Weak resting and voluntary anal sphincter tone,                            excess internal rectal descent, rectocele - found                            on digital rectal exam.                           - One diminutive polyp  in the cecum, removed with a                            cold snare. Resected and retrieved.                           - Diverticulosis in the entire examined colon.                           - The examination was otherwise normal on direct                            and retroflexion views.                           - Personal history of colonic polyps and Lynch                            Syndrome. Recommendation:           - Patient has a contact number available for                            emergencies. The signs and symptoms of potential  delayed complications were discussed with the                            patient. Return to normal activities tomorrow.                            Written discharge instructions were provided to the                            patient.                           - Resume previous diet.                           - Continue present medications.                           - WILL REFER TO PELVIC FLOOR PT FOR ANORECTAL                            DYSFUNCTION AND WEAKNESS Gatha Mayer, MD 02/06/2019 10:14:41 AM This report has been signed electronically.

## 2019-02-07 ENCOUNTER — Other Ambulatory Visit: Payer: Self-pay

## 2019-02-07 DIAGNOSIS — IMO0002 Reserved for concepts with insufficient information to code with codable children: Secondary | ICD-10-CM

## 2019-02-07 DIAGNOSIS — K6289 Other specified diseases of anus and rectum: Secondary | ICD-10-CM

## 2019-02-08 ENCOUNTER — Telehealth: Payer: Self-pay | Admitting: *Deleted

## 2019-02-08 NOTE — Telephone Encounter (Signed)
  Follow up Call-  Call back number 02/06/2019 11/09/2017  Post procedure Call Back phone  # [phone] cell [phone]  Permission to leave phone message Yes Yes  Some recent data might be hidden     Patient questions:  Do you have a fever, pain , or abdominal swelling? No. Pain Score  0 *  Have you tolerated food without any problems? Yes.    Have you been able to return to your normal activities? Yes.    Do you have any questions about your discharge instructions: Diet   No. Medications  No. Follow up visit  No.  Do you have questions or concerns about your Care? No.  Actions: * If pain score is 4 or above: No action needed, pain <4. 1. Have you developed a fever since your procedure? no  2.   Have you had an respiratory symptoms (SOB or cough) since your procedure? no  3.   Have you tested positive for COVID 19 since your procedure no  4.   Have you had any family members/close contacts diagnosed with the COVID 19 since your procedure?  no   If yes to any of these questions please route to Joylene John, RN and Alphonsa Gin, Therapist, sports.

## 2019-02-14 DIAGNOSIS — C4442 Squamous cell carcinoma of skin of scalp and neck: Secondary | ICD-10-CM | POA: Diagnosis not present

## 2019-02-16 ENCOUNTER — Encounter: Payer: Self-pay | Admitting: Internal Medicine

## 2019-02-16 NOTE — Progress Notes (Signed)
Diminutive ssp Recall 1 year Donnal Debar) My Chart letter

## 2019-02-17 DIAGNOSIS — Z23 Encounter for immunization: Secondary | ICD-10-CM | POA: Diagnosis not present

## 2019-02-20 ENCOUNTER — Other Ambulatory Visit: Payer: Self-pay

## 2019-02-20 ENCOUNTER — Encounter: Payer: Self-pay | Admitting: Physical Therapy

## 2019-02-20 ENCOUNTER — Ambulatory Visit: Payer: PPO | Attending: Internal Medicine | Admitting: Physical Therapy

## 2019-02-20 DIAGNOSIS — R278 Other lack of coordination: Secondary | ICD-10-CM | POA: Diagnosis not present

## 2019-02-20 DIAGNOSIS — M6281 Muscle weakness (generalized): Secondary | ICD-10-CM | POA: Diagnosis not present

## 2019-02-20 DIAGNOSIS — R159 Full incontinence of feces: Secondary | ICD-10-CM | POA: Insufficient documentation

## 2019-02-20 NOTE — Patient Instructions (Addendum)
Slow Contraction: Gravity Eliminated (Side-Lying)    Lie on left side, hips and knees slightly bent. Slowly squeeze pelvic floor for _2__ seconds. Rest for _5__ seconds. Repeat _10__ times. Do _3__ times a day.   Copyright  VHI. All rights reserved.  [facility] [address] [place], [place] [address] Phone # [phone] Fax [phone]

## 2019-02-20 NOTE — Therapy (Signed)
[facility] [address], [place], [place], [address] Phone: [phone]   Fax:  [phone]  Physical Therapy Evaluation  Patient Details  Name: Shelby Randolph MRN: [id number] Date of Birth: 1950/09/03 Referring Provider (PT): Dr. Silvano Rusk   Encounter Date: 02/20/2019  PT End of Session - 02/20/19 1614    Visit Number  1    Date for PT Re-Evaluation  04/17/19    PT Start Time  1615    PT Stop Time  1650    PT Time Calculation (min)  35 min    Activity Tolerance  Patient tolerated treatment well    Behavior During Therapy  [facility] for tasks assessed/performed       Past Medical History:  Diagnosis Date  . Adenomatous colon polyp   . Allergy   . Anemia   . Anxiety   . Arthritis   . Asthma   . Atherosclerosis of aorta ([place]) 03/2014   abnormal on ultrasound, repeat US within 5 years  . Cancer ([place])    Skin cancer squamus   . Depression   . Diverticulosis   . Eye injury    in college, some pertinent loss of vision in left eye  . Genital herpes   . GERD (gastroesophageal reflux disease)   . History of bone density study 09/2011   normal  . History of mammogram    Dr. Radene Knee  . History of melanoma    sees [facility], dermatology  . History of PFTs 01/20/2011   moderate airflow limitation, no significant response to bronchodilator  . Hyperlipidemia   . Hypertension 12/2011  . Insomnia   . Migraines    Dr. Melton Alar, migraines  . MSH6-related Lynch syndrome ([facility]) 08/20/2017  . Nearsightedness    wears glasses  . Osteoporosis   . Post-operative nausea and vomiting   . Routine gynecological examination    Dr. Radene Knee  . Zika virus disease 06/2014    Past Surgical History:  Procedure Laterality Date  . COLONOSCOPY  2016   Dr. Silvano Rusk, 2014 Dr. Earlean Shawl  . ESOPHAGOGASTRODUODENOSCOPY    . ROTATOR CUFF REPAIR Right 05/2010  . TONSILLECTOMY AND ADENOIDECTOMY     as a kid  . TUBAL LIGATION  1985   . VAGINAL HYSTERECTOMY  1998   total; abnormal peroids  . VARICOSE VEIN SURGERY     bilat    There were no vitals filed for this visit.   Subjective Assessment - 02/20/19 1619    Subjective  Patient has Lynch syndrome. Patient has IBS. Patient has been through nutritionsl counseling. Patient sticks with the FOMAD diet. Patient has diarrhea. Patient goes to the bathroom 3-4 times per day. I patient hikes or walks and has to go quickly if she has the urge. 90% of stool is watery diarrhea. patient has fecal incontinence.    Patient Stated Goals  get control of bowels and reduce leakage    Currently in Pain?  No/denies         [facility] PT Assessment - 02/20/19 0001      Assessment   Medical Diagnosis  K62.89, N81.89 Anorectal dysfunction due to pelvic organ prolapse    Referring Provider (PT)  Dr. Silvano Rusk    Onset Date/Surgical Date  --   chronic   Prior Therapy  none      Precautions   Precautions  None      Restrictions   Weight Bearing Restrictions  No  Balance Screen   Has the patient fallen in the past 6 months  No    Has the patient had a decrease in activity level because of a fear of falling?   No    Is the patient reluctant to leave their home because of a fear of falling?   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, all sitting    Leisure  read, hiking      Cognition   Overall Cognitive Status  Within Functional Limits for tasks assessed      Posture/Postural Control   Posture/Postural Control  Postural limitations    Postural Limitations  Rounded Shoulders;Forward head      ROM / Strength   AROM / PROM / Strength  AROM;PROM;Strength      AROM   Lumbar Extension  decreased by 25%      Strength   Overall Strength Comments  abdominal strength 2/5    Right Hip Extension  4/5     Right Hip ABduction  3+/5    Left Hip Flexion  4/5    Left Hip Extension  4/5    Left Hip Internal Rotation  4/5    Left Hip ABduction  4/5      Palpation   SI assessment   left ilium rotated posteriorly                Objective measurements completed on examination: See above findings.    Pelvic Floor Special Questions - 02/20/19 0001    Prior Pregnancies  Yes    Number of Pregnancies  6    Number of Vaginal Deliveries  5    Currently Sexually Active  No    Urinary Leakage  No    Urinary urgency  Yes   fecal   Fecal incontinence  Yes    Falling out feeling (prolapse)  No    External Perineal Exam  patient has a rectocele    Skin Integrity  Intact    Pelvic Floor Internal Exam  Patient confirms identification and approves PT to assess the pelvic floor and treatment    Exam Type  Rectal    Palpation  tenderness located on the right levator ani    Strength  weak squeeze, no lift    Strength # of reps  5    Strength # of seconds  1    Tone  low       [facility] Adult PT Treatment/Exercise - 02/20/19 0001      Self-Care   Self-Care  Other Self-Care Comments    Other Self-Care Comments   discussed with patient on trying metamucil for firming up her stools      Neuro Re-ed    Neuro Re-ed Details   sidely contracting the anal sphincter 2 seconds 10 times             PT Education - 02/20/19 1653    Education Details  pelvic floor contraction in sidely    Person(s) Educated  Patient    Methods  Explanation;Demonstration;Verbal cues;Handout    Comprehension  Returned demonstration;Verbalized understanding       PT Short Term Goals - 02/20/19 1614      PT SHORT TERM GOAL #1   Title  independent  with initial HEP    Time  4    Period  Weeks    Status  New    Target Date  03/27/19      PT SHORT TERM GOAL #2   Title  able to contract the anal sphincter for 5 seconds in sidely due to increased endurance    Time  4    Period  Weeks    Status  New     Target Date  03/20/19      PT SHORT TERM GOAL #3   Title  fecal leakage decreased >/= 25% due to improved pelvic floor and core strength    Time  4    Period  Weeks    Status  New    Target Date  03/20/19        PT Long Term Goals - 02/20/19 1616      PT LONG TERM GOAL #1   Title  independent with advanced HEP    Time  8    Period  Weeks    Status  New    Target Date  04/17/19      PT LONG TERM GOAL #2   Title  pelvic floor strength >/= 3/5 for 15 seconds due to improved endurance and coordination    Time  8    Period  Weeks    Status  New    Target Date  04/17/19      PT LONG TERM GOAL #3   Title  fecal leakage decreased >/= 75% due to improve strength and coordination of the pelvic floor muscles    Time  8    Period  Weeks    Status  New    Target Date  04/17/19      PT LONG TERM GOAL #4   Title  able to hold the urge to have a bowel movement for 5-10 minutes to give her time to get to the bathroom while hiking    Time  8    Period  Weeks    Status  New    Target Date  04/17/19             Plan - 02/20/19 1615    Clinical Impression Statement  Patient is a 68 year old female wtih issues on fecal leakage and diarrhea. Patient goes to the bathroom 3-4 times per day. Patient does not go to hike or out sometimes due to the diarrhea and fecal incontinence. Patient anal sphincter strength is 2/5 for 1 second. Patient needs tactile cues to contract the anal sphincter correctly. Patient will initially bear down when asking her to contract and sometimes use her gluteals instead of anus. Patient has tenderness located on the right levator ani. Patient has weakness in the abdomen and hips. Patient will benefit from skilled therapy to improve pelvic floor coordination, increase strength  to reduce leakage.    Personal Factors and Comorbidities  Age;Comorbidity 1;Comorbidity 2;Comorbidity 3+    Comorbidities  s/p vaginal hysterectomy, diverticulosis, osteoporosis, melanoma,  lynch syndrome    Examination-Activity Limitations  Toileting;Continence    Examination-Participation Restrictions  Community Activity;Interpersonal Relationship    Stability/Clinical Decision Making  Evolving/Moderate complexity    Clinical Decision Making  Moderate    Rehab Potential  Excellent    PT Frequency  1x / week    PT Duration  8 weeks    PT Treatment/Interventions  Biofeedback;Therapeutic activities;Therapeutic exercise;Patient/family education;Neuromuscular re-education;Manual techniques;Passive range of motion    PT Next Visit  Plan  work on right pelvic muscles, work on pelvic floor contraction with lift and breath, core strength, abdominal massage, diaphragm    Consulted and Agree with Plan of Care  Patient       Patient will benefit from skilled therapeutic intervention in order to improve the following deficits and impairments:  Decreased coordination, Increased fascial restricitons, Decreased strength  Visit Diagnosis: Muscle weakness (generalized) - Plan: PT plan of care cert/re-cert  Other lack of coordination - Plan: PT plan of care cert/re-cert  Incontinence of feces, unspecified fecal incontinence type - Plan: PT plan of care cert/re-cert     Problem List Patient Active Problem List   Diagnosis Date Noted  . Post-menopausal 07/07/2018  . Estrogen deficiency 07/07/2018  . Vaccine counseling 07/07/2018  . Need for pneumococcal vaccination 07/07/2018  . Osteopenia 07/07/2018  . Skin lesion 07/07/2018  . Leg cramping 01/24/2018  . Varicose veins of both lower extremities 01/24/2018  . Monoallelic mutation of MSH6 gene 09/14/2017  . Lynch syndrome 08/20/2017  . Toenail deformity 07/06/2017  . Medicare annual wellness visit, subsequent 06/25/2016  . Left hip pain 06/25/2016  . Encounter for health maintenance examination in adult 05/23/2015  . Generalized anxiety disorder 05/23/2015  . Vitamin D deficiency 05/23/2015  . IBS (irritable bowel syndrome)  06/11/2014  . Gallstones 04/12/2014  . Dyslipidemia 04/12/2014  . Essential hypertension 04/12/2014  . Asthma, mild intermittent 04/12/2014  . Insomnia 04/12/2014  . Genital herpes 04/12/2014  . Chronic nausea 04/12/2014  . History of colonic polyps 04/12/2014  . Rhinitis, allergic 04/12/2014  . Atherosclerosis of abdominal aorta ([place]) 04/12/2014    Earlie Counts, PT 02/20/19 5:05 PM   [facility] [address], [place], [place], [address] Phone: [phone]   Fax:  [phone]  Name: Shelby Randolph MRN: [id number] Date of Birth: 14-Apr-1951

## 2019-02-21 ENCOUNTER — Other Ambulatory Visit: Payer: Self-pay | Admitting: Internal Medicine

## 2019-02-21 NOTE — Telephone Encounter (Signed)
How many refills [name]? 

## 2019-02-22 NOTE — Telephone Encounter (Signed)
1year

## 2019-02-27 ENCOUNTER — Encounter: Payer: PPO | Admitting: Physical Therapy

## 2019-03-09 ENCOUNTER — Encounter: Payer: Self-pay | Admitting: Physical Therapy

## 2019-03-09 ENCOUNTER — Other Ambulatory Visit: Payer: Self-pay

## 2019-03-09 ENCOUNTER — Ambulatory Visit: Payer: PPO | Admitting: Physical Therapy

## 2019-03-09 DIAGNOSIS — R159 Full incontinence of feces: Secondary | ICD-10-CM

## 2019-03-09 DIAGNOSIS — R278 Other lack of coordination: Secondary | ICD-10-CM

## 2019-03-09 DIAGNOSIS — M6281 Muscle weakness (generalized): Secondary | ICD-10-CM | POA: Diagnosis not present

## 2019-03-09 NOTE — Therapy (Signed)
[facility] [address], [place], [place], [address] Phone: [phone]   Fax:  [phone]  Physical Therapy Treatment  Patient Details  Name: Shelby Randolph MRN: [id number] Date of Birth: [birth year] Referring Provider (PT): Dr. Silvano Rusk   Encounter Date: 03/09/2019  PT End of Session - 03/09/19 1659    Visit Number  2    Date for PT Re-Evaluation  04/17/19    Authorization Type  Health Team    PT Start Time  1941    PT Stop Time  1650    PT Time Calculation (min)  45 min    Activity Tolerance  Patient tolerated treatment well    Behavior During Therapy  [facility] for tasks assessed/performed       Past Medical History:  Diagnosis Date  . Adenomatous colon polyp   . Allergy   . Anemia   . Anxiety   . Arthritis   . Asthma   . Atherosclerosis of aorta ([place]) 03/2014   abnormal on ultrasound, repeat US within 5 years  . Cancer ([place])    Skin cancer squamus   . Depression   . Diverticulosis   . Eye injury    in college, some pertinent loss of vision in left eye  . Genital herpes   . GERD (gastroesophageal reflux disease)   . History of bone density study 09/2011   normal  . History of mammogram    Dr. Radene Knee  . History of melanoma    sees [facility], dermatology  . History of PFTs 01/20/2011   moderate airflow limitation, no significant response to bronchodilator  . Hyperlipidemia   . Hypertension 12/2011  . Insomnia   . Migraines    Dr. Melton Alar, migraines  . MSH6-related Lynch syndrome (HNPCC5) 08/20/2017  . Nearsightedness    wears glasses  . Osteoporosis   . Post-operative nausea and vomiting   . Routine gynecological examination    Dr. Radene Knee  . Zika virus disease 06/2014    Past Surgical History:  Procedure Laterality Date  . COLONOSCOPY  2016   Dr. Silvano Rusk, 2014 Dr. Earlean Shawl  . ESOPHAGOGASTRODUODENOSCOPY    . ROTATOR CUFF REPAIR Right 05/2010  . TONSILLECTOMY AND ADENOIDECTOMY      as a kid  . TUBAL LIGATION  1985  . VAGINAL HYSTERECTOMY  1998   total; abnormal peroids  . VARICOSE VEIN SURGERY     bilat    There were no vitals filed for this visit.  Subjective Assessment - 03/09/19 1603    Subjective  I felt fine after the initial evaluation. I have chronic diarrhea. It is hard to deal with. I had to go home to change due leakage.    Patient Stated Goals  get control of bowels and reduce leakage    Currently in Pain?  No/denies                       [facility] Adult PT Treatment/Exercise - 03/09/19 0001      Lumbar Exercises: Supine   Ab Set  10 reps;5 seconds    AB Set Limitations  using tactile cues and breath to engage the lower abdominals    Other Supine Lumbar Exercises  diaphragmatic breathing to move the diaphragm and pelvic floor together      Manual Therapy   Manual Therapy  Soft tissue mobilization;Myofascial release;Joint mobilization    Joint Mobilization  lower rib cage mobilization to move down  the ribs    Soft tissue mobilization  peristalic motion around the abdomen to promote perstalic motion for bowels; bilateral diaphragm, mobilization of the cecum; mobilization of the sigmoid    Myofascial Release  lower abdomen to release  sac of douglas, mesenteric root, iliocecal valve, right upper quadrant             PT Education - 03/09/19 1656    Education Details  Access Code: [email or web address]; abdominal massage    Person(s) Educated  Patient    Methods  Explanation;Demonstration;Verbal cues;Handout    Comprehension  Verbalized understanding;Returned demonstration       PT Short Term Goals - 02/20/19 1614      PT SHORT TERM GOAL #1   Title  independent with initial HEP    Time  4    Period  Weeks    Status  New    Target Date  03/27/19      PT SHORT TERM GOAL #2   Title  able to contract the anal sphincter for 5 seconds in sidely due to increased endurance    Time  4    Period  Weeks    Status  New    Target Date   03/20/19      PT SHORT TERM GOAL #3   Title  fecal leakage decreased >/= 25% due to improved pelvic floor and core strength    Time  4    Period  Weeks    Status  New    Target Date  03/20/19        PT Long Term Goals - 02/20/19 1616      PT LONG TERM GOAL #1   Title  independent with advanced HEP    Time  8    Period  Weeks    Status  New    Target Date  04/17/19      PT LONG TERM GOAL #2   Title  pelvic floor strength >/= 3/5 for 15 seconds due to improved endurance and coordination    Time  8    Period  Weeks    Status  New    Target Date  04/17/19      PT LONG TERM GOAL #3   Title  fecal leakage decreased >/= 75% due to improve strength and coordination of the pelvic floor muscles    Time  8    Period  Weeks    Status  New    Target Date  04/17/19      PT LONG TERM GOAL #4   Title  able to hold the urge to have a bowel movement for 5-10 minutes to give her time to get to the bathroom while hiking    Time  8    Period  Weeks    Status  New    Target Date  04/17/19            Plan - 03/09/19 1602    Clinical Impression Statement  Patient took several tries to contract the lower abdomen. Patient needed verbal cues to not contract the upper abdomen while bulging the lower abdomen. Patient was able to expand the abdomen for diaphragmatic breathing. Patient is having fecal incontinence due to not emptying her bowels and the diarrhea. Patient will benefit from skilled therapy to work on pelvic floor strength and coordination.    Personal Factors and Comorbidities  Age;Comorbidity 1;Comorbidity 2;Comorbidity 3+    Comorbidities  s/p vaginal hysterectomy, diverticulosis, osteoporosis, melanoma, lynch  syndrome    Examination-Activity Limitations  Toileting;Continence    Examination-Participation Restrictions  Community Activity;Interpersonal Relationship    Stability/Clinical Decision Making  Evolving/Moderate complexity    Rehab Potential  Excellent    PT Frequency   1x / week    PT Duration  8 weeks    PT Treatment/Interventions  Biofeedback;Therapeutic activities;Therapeutic exercise;Patient/family education;Neuromuscular re-education;Manual techniques;Passive range of motion    PT Next Visit Plan  work on right pelvic muscles, work on anal contraction with lift and breath, core strength, abdominal massage    Recommended Other Services  MD signed initial eval    Consulted and Agree with Plan of Care  Patient       Patient will benefit from skilled therapeutic intervention in order to improve the following deficits and impairments:  Decreased coordination, Increased fascial restricitons, Decreased strength  Visit Diagnosis: Muscle weakness (generalized)  Other lack of coordination  Incontinence of feces, unspecified fecal incontinence type     Problem List Patient Active Problem List   Diagnosis Date Noted  . Post-menopausal 07/07/2018  . Estrogen deficiency 07/07/2018  . Vaccine counseling 07/07/2018  . Need for pneumococcal vaccination 07/07/2018  . Osteopenia 07/07/2018  . Skin lesion 07/07/2018  . Leg cramping 01/24/2018  . Varicose veins of both lower extremities 01/24/2018  . Monoallelic mutation of MSH6 gene 09/14/2017  . Lynch syndrome 08/20/2017  . Toenail deformity 07/06/2017  . Medicare annual wellness visit, subsequent 06/25/2016  . Left hip pain 06/25/2016  . Encounter for health maintenance examination in adult 05/23/2015  . Generalized anxiety disorder 05/23/2015  . Vitamin D deficiency 05/23/2015  . IBS (irritable bowel syndrome) 06/11/2014  . Gallstones 04/12/2014  . Dyslipidemia 04/12/2014  . Essential hypertension 04/12/2014  . Asthma, mild intermittent 04/12/2014  . Insomnia 04/12/2014  . Genital herpes 04/12/2014  . Chronic nausea 04/12/2014  . History of colonic polyps 04/12/2014  . Rhinitis, allergic 04/12/2014  . Atherosclerosis of abdominal aorta ([place]) 04/12/2014    Earlie Counts, PT 03/09/19 5:03  PM   [facility] [address], [place], [place], [address] Phone: [phone]   Fax:  [phone]  Name: Shelby Randolph MRN: [id number] Date of Birth: [birth year]

## 2019-03-09 NOTE — Patient Instructions (Addendum)
About Abdominal Massage  Abdominal massage, also called external colon massage, is a self-treatment circular massage technique that can reduce and eliminate gas and ease constipation. The colon naturally contracts in waves in a clockwise direction starting from inside the right hip, moving up toward the ribs, across the belly, and down inside the left hip.  When you perform circular abdominal massage, you help stimulate your colon's normal wave pattern of movement called peristalsis.  It is most beneficial when done after eating.  Positioning You can practice abdominal massage with oil while lying down, or in the shower with soap.  Some people find that it is just as effective to do the massage through clothing while sitting or standing.  How to Massage Start by placing your finger tips or knuckles on your right side, just inside your hip bone.  . Make small circular movements while you move upward toward your rib cage.   . Once you reach the bottom right side of your rib cage, take your circular movements across to the left side of the bottom of your rib cage.  . Next, move downward until you reach the inside of your left hip bone.  This is the path your feces travel in your colon. . Continue to perform your abdominal massage in this pattern for 10 minutes each day.     You can apply as much pressure as is comfortable in your massage.  Start gently and build pressure as you continue to practice.  Notice any areas of pain as you massage; areas of slight pain may be relieved as you massage, but if you have areas of significant or intense pain, consult with your healthcare provider.  Other Considerations . General physical activity including bending and stretching can have a beneficial massage-like effect on the colon.  Deep breathing can also stimulate the colon because breathing deeply activates the same nervous system that supplies the colon.   . Abdominal massage should always be used in  combination with a bowel-conscious diet that is high in the proper type of fiber for you, fluids (primarily water), and a regular exercise program. Access Code: [email or web address]  URL: [email or web address]  Date: 03/09/2019  Prepared by: Earlie Counts   Exercises Supine Diaphragmatic Breathing - 5 reps - 1 sets - 2x daily - 7x weekly Hooklying Transversus Abdominis Palpation - 5 reps - 1 sets - 5 sec hold - 2x daily - 7x weekly [facility] [address] [place], [place] [address] Phone # [phone] Fax [phone]

## 2019-03-16 ENCOUNTER — Encounter: Payer: PPO | Admitting: Physical Therapy

## 2019-03-17 ENCOUNTER — Telehealth: Payer: Self-pay

## 2019-03-17 NOTE — Telephone Encounter (Signed)
We received forms for patient assistance for patient to get lotronex. Dr Carlean Purl has signed and I emailed them back to her. She said she knows it will be denied but they said they would send her samples.

## 2019-03-22 ENCOUNTER — Encounter: Payer: Self-pay | Admitting: Physical Therapy

## 2019-03-22 ENCOUNTER — Other Ambulatory Visit: Payer: Self-pay

## 2019-03-22 ENCOUNTER — Ambulatory Visit: Payer: PPO | Attending: Internal Medicine | Admitting: Physical Therapy

## 2019-03-22 DIAGNOSIS — M6281 Muscle weakness (generalized): Secondary | ICD-10-CM

## 2019-03-22 DIAGNOSIS — R278 Other lack of coordination: Secondary | ICD-10-CM

## 2019-03-22 DIAGNOSIS — R159 Full incontinence of feces: Secondary | ICD-10-CM | POA: Diagnosis not present

## 2019-03-22 NOTE — Therapy (Signed)
[facility] [address] [place], [place], [address] Phone: [phone]   Fax:  [phone]  Physical Therapy Treatment  Patient Details  Name: Shelby Randolph MRN: [id number] Date of Birth: 04-Jul-1950 Referring Provider (PT): Dr. Silvano Rusk   Encounter Date: 03/22/2019  PT End of Session - 03/22/19 1619    Visit Number  3    Date for PT Re-Evaluation  04/17/19    Authorization Type  Health Team    PT Start Time  8882    PT Stop Time  1655    PT Time Calculation (min)  40 min    Activity Tolerance  Patient tolerated treatment well    Behavior During Therapy  [facility] for tasks assessed/performed       Past Medical History:  Diagnosis Date  . Adenomatous colon polyp   . Allergy   . Anemia   . Anxiety   . Arthritis   . Asthma   . Atherosclerosis of aorta ([place]) 03/2014   abnormal on ultrasound, repeat US within 5 years  . Cancer ([place])    Skin cancer squamus   . Depression   . Diverticulosis   . Eye injury    in college, some pertinent loss of vision in left eye  . Genital herpes   . GERD (gastroesophageal reflux disease)   . History of bone density study 09/2011   normal  . History of mammogram    Dr. Radene Knee  . History of melanoma    sees [facility], dermatology  . History of PFTs 01/20/2011   moderate airflow limitation, no significant response to bronchodilator  . Hyperlipidemia   . Hypertension 12/2011  . Insomnia   . Migraines    Dr. Melton Alar, migraines  . MSH6-related Lynch syndrome (HNPCC5) 08/20/2017  . Nearsightedness    wears glasses  . Osteoporosis   . Post-operative nausea and vomiting   . Routine gynecological examination    Dr. Radene Knee  . Zika virus disease 06/2014    Past Surgical History:  Procedure Laterality Date  . COLONOSCOPY  2016   Dr. Silvano Rusk, 2014 Dr. Earlean Shawl  . ESOPHAGOGASTRODUODENOSCOPY    . ROTATOR CUFF REPAIR Right 05/2010  . TONSILLECTOMY AND ADENOIDECTOMY      as a kid  . TUBAL LIGATION  1985  . VAGINAL HYSTERECTOMY  1998   total; abnormal peroids  . VARICOSE VEIN SURGERY     bilat    There were no vitals filed for this visit.  Subjective Assessment - 03/22/19 1618    Subjective  The leakage is the same even when I do the exercise. The leakage is a constant problem. I do not feel the leakage.    Patient Stated Goals  get control of bowels and reduce leakage    Currently in Pain?  No/denies                    Pelvic Floor Special Questions - 03/22/19 0001    Biofeedback  resting tone sitting 2uv, sitting contract to 20 uv for quick flick, sidely relac to 2 uv contract to 6 uv for 5 sec ; contract pelvic floor in sidely with ball between knees holding 10 seconds    Biofeedback sensor type  Surface   rectum               PT Education - 03/22/19 1657    Education Details  pelvic floor exercise in sidely with ball squeeze and  clam; resistive hip flexion with anal contraction    Person(s) Educated  Patient    Methods  Explanation;Demonstration;Verbal cues;Handout    Comprehension  Verbalized understanding;Returned demonstration       PT Short Term Goals - 03/22/19 1708      PT SHORT TERM GOAL #1   Title  independent with initial HEP    Time  4    Period  Weeks    Status  On-going      PT SHORT TERM GOAL #2   Title  able to contract the anal sphincter for 5 seconds in sidely due to increased endurance    Baseline  with ball squeeze    Time  4    Period  Weeks    Status  On-going      PT SHORT TERM GOAL #3   Title  fecal leakage decreased >/= 25% due to improved pelvic floor and core strength    Time  4    Period  Weeks    Status  On-going        PT Long Term Goals - 02/20/19 1616      PT LONG TERM GOAL #1   Title  independent with advanced HEP    Time  8    Period  Weeks    Status  New    Target Date  04/17/19      PT LONG TERM GOAL #2   Title  pelvic floor strength >/= 3/5 for 15 seconds  due to improved endurance and coordination    Time  8    Period  Weeks    Status  New    Target Date  04/17/19      PT LONG TERM GOAL #3   Title  fecal leakage decreased >/= 75% due to improve strength and coordination of the pelvic floor muscles    Time  8    Period  Weeks    Status  New    Target Date  04/17/19      PT LONG TERM GOAL #4   Title  able to hold the urge to have a bowel movement for 5-10 minutes to give her time to get to the bathroom while hiking    Time  8    Period  Weeks    Status  New    Target Date  04/17/19            Plan - 03/22/19 1619    Clinical Impression Statement  Patient continues to have leakage continuously and has trouble with sensing if she has leaked stool. Patient resting level is 2 uv in sitting and sidely. Patient does not have edurance with pelvic floor contraction in sitting. She can only contract for 1 second. Patient is able to contract for 5 seconds in sidely with ball squeeze. Patient will benefit from skilled therapy to work on pelvic floor contraction to reduce fecal leakage.    Personal Factors and Comorbidities  Age;Comorbidity 1;Comorbidity 2;Comorbidity 3+    Comorbidities  s/p vaginal hysterectomy, diverticulosis, osteoporosis, melanoma, lynch syndrome    Examination-Activity Limitations  Toileting;Continence    Examination-Participation Restrictions  Community Activity;Interpersonal Relationship    Stability/Clinical Decision Making  Evolving/Moderate complexity    Rehab Potential  Excellent    PT Frequency  1x / week    PT Duration  8 weeks    PT Treatment/Interventions  Biofeedback;Therapeutic activities;Therapeutic exercise;Patient/family education;Neuromuscular re-education;Manual techniques;Passive range of motion    PT Next Visit Plan  core   strength and pelvic floor strength with EMG in sidely then sitting    Consulted and Agree with Plan of Care  Patient       Patient will benefit from skilled therapeutic  intervention in order to improve the following deficits and impairments:  Decreased coordination, Increased fascial restricitons, Decreased strength  Visit Diagnosis: Muscle weakness (generalized)  Other lack of coordination  Incontinence of feces, unspecified fecal incontinence type     Problem List Patient Active Problem List   Diagnosis Date Noted  . Post-menopausal 07/07/2018  . Estrogen deficiency 07/07/2018  . Vaccine counseling 07/07/2018  . Need for pneumococcal vaccination 07/07/2018  . Osteopenia 07/07/2018  . Skin lesion 07/07/2018  . Leg cramping 01/24/2018  . Varicose veins of both lower extremities 01/24/2018  . Monoallelic mutation of MSH6 gene 09/14/2017  . Lynch syndrome 08/20/2017  . Toenail deformity 07/06/2017  . Medicare annual wellness visit, subsequent 06/25/2016  . Left hip pain 06/25/2016  . Encounter for health maintenance examination in adult 05/23/2015  . Generalized anxiety disorder 05/23/2015  . Vitamin D deficiency 05/23/2015  . IBS (irritable bowel syndrome) 06/11/2014  . Gallstones 04/12/2014  . Dyslipidemia 04/12/2014  . Essential hypertension 04/12/2014  . Asthma, mild intermittent 04/12/2014  . Insomnia 04/12/2014  . Genital herpes 04/12/2014  . Chronic nausea 04/12/2014  . History of colonic polyps 04/12/2014  . Rhinitis, allergic 04/12/2014  . Atherosclerosis of abdominal aorta ([facility]) 04/12/2014    Cheryl Gray, PT 03/22/19 5:10 PM   [facility] [address], [address] [place], [place], [address] Phone: [phone]   Fax:  [phone]  Name: Faria P Klemens MRN: [id number] Date of Birth: 12/08/1950   

## 2019-03-22 NOTE — Patient Instructions (Addendum)
Adduction: Hip - Knees Together With Pelvic Floor (Side-Lying)    Lie on left side, hips and knees slightly bent, towel roll between knees. Squeeze pelvic floor while pushing knees together. Hold for _5-10__ seconds. Rest for _10__ seconds. Repeat 10___ times. Do _2__ times a day.   Copyright  VHI. All rights reserved.  External Rotation: Hip - Knees Apart With Pelvic Floor (Side-Lying)    Lie on left side with hips and knees slightly bent, band tied just above knees. Squeeze pelvic floor while lifting top knee. Hold for _5__ seconds. Rest for _5__ seconds. Repeat _10__ times. Do __2_ times a day.   Copyright  VHI. All rights reserved.  FLEXION: Supine (Isometric)    Lie on back, feet flat. Draw left knee toward chest push gently with same side arm. Hold _5__ seconds. Complete _1__ sets of _10__ repetitions. Perform _1__ sessions per day. Contract the anus Copyright  VHI. All rights reserved.  [facility] [address] [place], [place] [address] Phone # [phone] Fax [phone]

## 2019-03-29 ENCOUNTER — Ambulatory Visit: Payer: PPO | Admitting: Physical Therapy

## 2019-03-29 ENCOUNTER — Encounter: Payer: Self-pay | Admitting: Physical Therapy

## 2019-03-29 ENCOUNTER — Other Ambulatory Visit: Payer: Self-pay

## 2019-03-29 DIAGNOSIS — M6281 Muscle weakness (generalized): Secondary | ICD-10-CM

## 2019-03-29 DIAGNOSIS — R278 Other lack of coordination: Secondary | ICD-10-CM

## 2019-03-29 DIAGNOSIS — R159 Full incontinence of feces: Secondary | ICD-10-CM

## 2019-03-29 NOTE — Therapy (Signed)
[facility] [address] [place], [place], [address] Phone: [phone]   Fax:  [phone]  Physical Therapy Treatment  Patient Details  Name: Shelby Randolph MRN: [id number] Date of Birth: Jul 26, 1950 Referring Provider (PT): Dr. Silvano Rusk   Encounter Date: 03/29/2019  PT End of Session - 03/29/19 1627    Visit Number  4    Date for PT Re-Evaluation  04/17/19    Authorization Type  Health Team    PT Start Time  7915    PT Stop Time  1655    PT Time Calculation (min)  40 min    Activity Tolerance  Patient tolerated treatment well    Behavior During Therapy  [facility] for tasks assessed/performed       Past Medical History:  Diagnosis Date  . Adenomatous colon polyp   . Allergy   . Anemia   . Anxiety   . Arthritis   . Asthma   . Atherosclerosis of aorta ([place]) 03/2014   abnormal on ultrasound, repeat US within 5 years  . Cancer ([place])    Skin cancer squamus   . Depression   . Diverticulosis   . Eye injury    in college, some pertinent loss of vision in left eye  . Genital herpes   . GERD (gastroesophageal reflux disease)   . History of bone density study 09/2011   normal  . History of mammogram    Dr. Radene Knee  . History of melanoma    sees [facility], dermatology  . History of PFTs 01/20/2011   moderate airflow limitation, no significant response to bronchodilator  . Hyperlipidemia   . Hypertension 12/2011  . Insomnia   . Migraines    Dr. Melton Alar, migraines  . MSH6-related Lynch syndrome (HNPCC5) 08/20/2017  . Nearsightedness    wears glasses  . Osteoporosis   . Post-operative nausea and vomiting   . Routine gynecological examination    Dr. Radene Knee  . Zika virus disease 06/2014    Past Surgical History:  Procedure Laterality Date  . COLONOSCOPY  2016   Dr. Silvano Rusk, 2014 Dr. Earlean Shawl  . ESOPHAGOGASTRODUODENOSCOPY    . ROTATOR CUFF REPAIR Right 05/2010  . TONSILLECTOMY AND ADENOIDECTOMY      as a kid  . TUBAL LIGATION  1985  . VAGINAL HYSTERECTOMY  1998   total; abnormal peroids  . VARICOSE VEIN SURGERY     bilat    There were no vitals filed for this visit.  Subjective Assessment - 03/29/19 1615    Subjective  The have gummi form of PHillips fiber. I feel like the leakage is better and I have been doing my exercises. I have to think of the foods I am eating. No leaks today. I leak everyday mid day.    Patient Stated Goals  get control of bowels and reduce leakage    Currently in Pain?  No/denies                    Pelvic Floor Special Questions - 03/29/19 0001    Biofeedback  sitting pelvic floor contraction, core strength with pelvic EMG    Biofeedback sensor type  Surface   rectum       [facility] Adult PT Treatment/Exercise - 03/29/19 0001      Self-Care   Self-Care  Other Self-Care Comments    Other Self-Care Comments   discussed with patinet on when she will routinely have to go to  the commode and it will be with her walk. Patient will go for her short walk then go to the bathroom then her long walk      Lumbar Exercises: Supine   Clam  10 reps;1 second   each leg   Clam Limitations  with pelvic floor EMG    Bridge with Diona Foley Squeeze  15 reps;1 second    Bridge with [employer] Limitations  with pelvic floor emg    Isometric Hip Flexion  10 reps;5 seconds   each leg; contract pelvic floor   Isometric Hip Flexion Limitations  pelvic floor EMG             PT Education - 03/29/19 1648    Education Details  Access Code: [email or web address]    Person(s) Educated  Patient    Methods  Explanation;Demonstration;Verbal cues;Handout    Comprehension  Returned demonstration;Verbalized understanding       PT Short Term Goals - 03/29/19 1657      PT SHORT TERM GOAL #1   Title  independent with initial HEP    Time  4    Period  Weeks    Status  Achieved    Target Date  03/27/19      PT SHORT TERM GOAL #2   Title  able to contract the anal  sphincter for 5 seconds in sidely due to increased endurance    Time  4    Period  Weeks    Status  Achieved    Target Date  03/20/19      PT SHORT TERM GOAL #3   Title  fecal leakage decreased >/= 25% due to improved pelvic floor and core strength    Time  4    Period  Weeks    Status  On-going    Target Date  03/20/19        PT Long Term Goals - 02/20/19 1616      PT LONG TERM GOAL #1   Title  independent with advanced HEP    Time  8    Period  Weeks    Status  New    Target Date  04/17/19      PT LONG TERM GOAL #2   Title  pelvic floor strength >/= 3/5 for 15 seconds due to improved endurance and coordination    Time  8    Period  Weeks    Status  New    Target Date  04/17/19      PT LONG TERM GOAL #3   Title  fecal leakage decreased >/= 75% due to improve strength and coordination of the pelvic floor muscles    Time  8    Period  Weeks    Status  New    Target Date  04/17/19      PT LONG TERM GOAL #4   Title  able to hold the urge to have a bowel movement for 5-10 minutes to give her time to get to the bathroom while hiking    Time  8    Period  Weeks    Status  New    Target Date  04/17/19            Plan - 03/29/19 1628    Clinical Impression Statement  Patient able to hold a pelvic floor contraction sitting for 5 seconds after she did the co-contraction exercises in supine and engaging the lower abdominals. Patient reports she sees an improvement in leakage. Patient will  have to have a bowel movement when she walks in the afternoon. Patient will take a short walk with her dog, go to the bathroom and then go for a longer walk. Patient will benefit from skilled therapy to reduce fecal leakage.    Personal Factors and Comorbidities  Age;Comorbidity 1;Comorbidity 2;Comorbidity 3+    Comorbidities  s/p vaginal hysterectomy, diverticulosis, osteoporosis, melanoma, lynch syndrome    Examination-Activity Limitations  Toileting;Continence     Examination-Participation Restrictions  Community Activity;Interpersonal Relationship    Stability/Clinical Decision Making  Evolving/Moderate complexity    Rehab Potential  Excellent    PT Frequency  1x / week    PT Duration  8 weeks    PT Treatment/Interventions  Biofeedback;Therapeutic activities;Therapeutic exercise;Patient/family education;Neuromuscular re-education;Manual techniques;Passive range of motion    PT Next Visit Plan  core strength and pelvic floor strength with EMG in sidely then sitting    PT Home Exercise Plan  Access Code: [email or web address]    Consulted and Agree with Plan of Care  Patient       Patient will benefit from skilled therapeutic intervention in order to improve the following deficits and impairments:  Decreased coordination, Increased fascial restricitons, Decreased strength  Visit Diagnosis: Muscle weakness (generalized)  Other lack of coordination  Incontinence of feces, unspecified fecal incontinence type     Problem List Patient Active Problem List   Diagnosis Date Noted  . Post-menopausal 07/07/2018  . Estrogen deficiency 07/07/2018  . Vaccine counseling 07/07/2018  . Need for pneumococcal vaccination 07/07/2018  . Osteopenia 07/07/2018  . Skin lesion 07/07/2018  . Leg cramping 01/24/2018  . Varicose veins of both lower extremities 01/24/2018  . Monoallelic mutation of MSH6 gene 09/14/2017  . Lynch syndrome 08/20/2017  . Toenail deformity 07/06/2017  . Medicare annual wellness visit, subsequent 06/25/2016  . Left hip pain 06/25/2016  . Encounter for health maintenance examination in adult 05/23/2015  . Generalized anxiety disorder 05/23/2015  . Vitamin D deficiency 05/23/2015  . IBS (irritable bowel syndrome) 06/11/2014  . Gallstones 04/12/2014  . Dyslipidemia 04/12/2014  . Essential hypertension 04/12/2014  . Asthma, mild intermittent 04/12/2014  . Insomnia 04/12/2014  . Genital herpes 04/12/2014  . Chronic nausea 04/12/2014  . History  of colonic polyps 04/12/2014  . Rhinitis, allergic 04/12/2014  . Atherosclerosis of abdominal aorta ([place]) 04/12/2014    Earlie Counts, PT 03/29/19 4:59 PM   [facility] [address] [place], [place], [address] Phone: [phone]   Fax:  [phone]  Name: Shelby Randolph MRN: [id number] Date of Birth: [birth year]

## 2019-03-29 NOTE — Patient Instructions (Signed)
Access Code: [email or web address]  URL: [email or web address]  Date: 03/29/2019  Prepared by: Earlie Counts   Exercises Supine Diaphragmatic Breathing - 5 reps - 1 sets - 2x daily - 7x weekly Hooklying Transversus Abdominis Palpation - 5 reps - 1 sets - 5 sec hold - 2x daily - 7x weekly Hooklying Isometric Hip Flexion - 10 reps - 1 sets - 1x daily - 7x weekly Bent Knee Fallouts - 10 reps - 1 sets - 1x daily - 7x weekly Supine Bridge with Mini Swiss Ball Between Knees - 15 reps - 1 sets - 1x daily - 7x weekly [facility] [address] [place], [place] [address] Phone # [phone] Fax [phone]

## 2019-03-31 NOTE — Telephone Encounter (Signed)
How many refills do you approve [name], thank you.

## 2019-04-05 ENCOUNTER — Other Ambulatory Visit: Payer: Self-pay

## 2019-04-05 MED ORDER — ALOSETRON HCL 0.5 MG PO TABS: ORAL_TABLET | ORAL | 11 refills | Status: DC

## 2019-04-05 NOTE — Telephone Encounter (Signed)
See the recent [facility] message. Her alosetron rx has been faxed to [facility] as patient requested and Dr Carlean Purl approved. Their fax # is [phone], phone # [phone], option # 3.

## 2019-04-12 ENCOUNTER — Ambulatory Visit: Payer: PPO | Attending: Internal Medicine | Admitting: Physical Therapy

## 2019-04-12 ENCOUNTER — Encounter: Payer: Self-pay | Admitting: Physical Therapy

## 2019-04-12 ENCOUNTER — Other Ambulatory Visit: Payer: Self-pay

## 2019-04-12 DIAGNOSIS — R278 Other lack of coordination: Secondary | ICD-10-CM | POA: Insufficient documentation

## 2019-04-12 DIAGNOSIS — R159 Full incontinence of feces: Secondary | ICD-10-CM | POA: Diagnosis not present

## 2019-04-12 DIAGNOSIS — M6281 Muscle weakness (generalized): Secondary | ICD-10-CM | POA: Insufficient documentation

## 2019-04-12 NOTE — Therapy (Signed)
[facility] [address] [place], [place], [address] Phone: [phone]   Fax:  [phone]  Physical Therapy Treatment  Patient Details  Name: Shelby Randolph MRN: [id number] Date of Birth: Jun 09, 1950 Referring Provider (PT): Dr. Silvano Rusk   Encounter Date: 04/12/2019  PT End of Session - 04/12/19 0855    Visit Number  5    Date for PT Re-Evaluation  04/17/19    Authorization Type  Health Team    PT Start Time  0845    PT Stop Time  0925    PT Time Calculation (min)  40 min    Activity Tolerance  Patient tolerated treatment well    Behavior During Therapy  [facility] for tasks assessed/performed       Past Medical History:  Diagnosis Date  . Adenomatous colon polyp   . Allergy   . Anemia   . Anxiety   . Arthritis   . Asthma   . Atherosclerosis of aorta ([place]) 03/2014   abnormal on ultrasound, repeat US within 5 years  . Cancer ([place])    Skin cancer squamus   . Depression   . Diverticulosis   . Eye injury    in college, some pertinent loss of vision in left eye  . Genital herpes   . GERD (gastroesophageal reflux disease)   . History of bone density study 09/2011   normal  . History of mammogram    Dr. Radene Knee  . History of melanoma    sees [facility], dermatology  . History of PFTs 01/20/2011   moderate airflow limitation, no significant response to bronchodilator  . Hyperlipidemia   . Hypertension 12/2011  . Insomnia   . Migraines    Dr. Melton Alar, migraines  . MSH6-related Lynch syndrome (HNPCC5) 08/20/2017  . Nearsightedness    wears glasses  . Osteoporosis   . Post-operative nausea and vomiting   . Routine gynecological examination    Dr. Radene Knee  . Zika virus disease 06/2014    Past Surgical History:  Procedure Laterality Date  . COLONOSCOPY  2016   Dr. Silvano Rusk, 2014 Dr. Earlean Shawl  . ESOPHAGOGASTRODUODENOSCOPY    . ROTATOR CUFF REPAIR Right 05/2010  . TONSILLECTOMY AND ADENOIDECTOMY     as a kid  . TUBAL LIGATION  1985  . VAGINAL HYSTERECTOMY  1998   total; abnormal peroids  . VARICOSE VEIN SURGERY     bilat    There were no vitals filed for this visit.  Subjective Assessment - 04/12/19 0853    Subjective  I am seeing [facility] due to left knee pain. After I walk my knee is very tight. I had bad diarrhea due to the food I was eating. I still have leakage and no change with leakage. I had a blow out over the weekend in the woods and it was very watery.    Patient Stated Goals  get control of bowels and reduce leakage    Currently in Pain?  Yes    Pain Score  6     Pain Location  Knee    Pain Orientation  Left    Pain Descriptors / Indicators  Tightness    Pain Type  Acute pain    Pain Onset  1 to 4 weeks ago    Pain Frequency  Intermittent    Aggravating Factors   walking    Pain Relieving Factors  rest  Pelvic Floor Special Questions - 04/12/19 0001    Biofeedback  sitting holding for 5 seconds at 15 uv; standing holding for 5 sec at 15 uv;  walking pelvic floor contraction     Biofeedback sensor type  Surface   rectum       [facility] Adult PT Treatment/Exercise - 04/12/19 0001      Knee/Hip Exercises: Aerobic   Recumbent Bike  level 1 for 5 minutes while assessing patient             PT Education - 04/12/19 0921    Education Details  pelvic floor contraction in sitting, standing and liftig leg    Person(s) Educated  Patient    Methods  Explanation;Demonstration;Verbal cues;Handout    Comprehension  Returned demonstration;Verbalized understanding       PT Short Term Goals - 03/29/19 1657      PT SHORT TERM GOAL #1   Title  independent with initial HEP    Time  4    Period  Weeks    Status  Achieved    Target Date  03/27/19      PT SHORT TERM GOAL #2   Title  able to contract the anal sphincter for 5 seconds in sidely due to increased endurance    Time  4    Period  Weeks    Status  Achieved    Target Date   03/20/19      PT SHORT TERM GOAL #3   Title  fecal leakage decreased >/= 25% due to improved pelvic floor and core strength    Time  4    Period  Weeks    Status  On-going    Target Date  03/20/19        PT Long Term Goals - 04/12/19 7989      PT LONG TERM GOAL #1   Title  independent with advanced HEP    Time  8    Period  Weeks    Status  On-going      PT LONG TERM GOAL #2   Title  pelvic floor strength >/= 3/5 for 15 seconds due to improved endurance and coordination    Time  8    Period  Weeks    Status  On-going      PT LONG TERM GOAL #3   Title  fecal leakage decreased >/= 75% due to improve strength and coordination of the pelvic floor muscles    Time  8    Period  Weeks    Status  On-going      PT LONG TERM GOAL #4   Title  able to hold the urge to have a bowel movement for 5-10 minutes to give her time to get to the bathroom while hiking    Time  8    Period  Weeks    Status  On-going            Plan - 04/12/19 2119    Clinical Impression Statement  Patient is able to contract pelvic floor for 5 seconds above 15 uv in sitting, standing and lifting her leg in standing. Patient used to only contract for 1 second. Patient still has some leakage but now able to contract for longer period. Patient will have more diarrhea when she eats certain foods. Patient will benefit from skilled therapy to improve strength and coordination of the pelvic floor.    Personal Factors and Comorbidities  Age;Comorbidity 1;Comorbidity 2;Comorbidity 3+  Comorbidities  s/p vaginal hysterectomy, diverticulosis, osteoporosis, melanoma, lynch syndrome    Examination-Activity Limitations  Toileting;Continence    Examination-Participation Restrictions  Community Activity;Interpersonal Relationship    Stability/Clinical Decision Making  Evolving/Moderate complexity    Rehab Potential  Excellent    PT Frequency  1x / week    PT Duration  8 weeks    PT Treatment/Interventions   Biofeedback;Therapeutic activities;Therapeutic exercise;Patient/family education;Neuromuscular re-education;Manual techniques;Passive range of motion    PT Next Visit Plan  core strength and pelvic floor strength with EMG in sittng and standing    PT Home Exercise Plan  Access Code: [email or web address]    Consulted and Agree with Plan of Care  Patient       Patient will benefit from skilled therapeutic intervention in order to improve the following deficits and impairments:  Decreased coordination, Increased fascial restricitons, Decreased strength  Visit Diagnosis: Muscle weakness (generalized)  Other lack of coordination  Incontinence of feces, unspecified fecal incontinence type     Problem List Patient Active Problem List   Diagnosis Date Noted  . Post-menopausal 07/07/2018  . Estrogen deficiency 07/07/2018  . Vaccine counseling 07/07/2018  . Need for pneumococcal vaccination 07/07/2018  . Osteopenia 07/07/2018  . Skin lesion 07/07/2018  . Leg cramping 01/24/2018  . Varicose veins of both lower extremities 01/24/2018  . Monoallelic mutation of MSH6 gene 09/14/2017  . Lynch syndrome 08/20/2017  . Toenail deformity 07/06/2017  . Medicare annual wellness visit, subsequent 06/25/2016  . Left hip pain 06/25/2016  . Encounter for health maintenance examination in adult 05/23/2015  . Generalized anxiety disorder 05/23/2015  . Vitamin D deficiency 05/23/2015  . IBS (irritable bowel syndrome) 06/11/2014  . Gallstones 04/12/2014  . Dyslipidemia 04/12/2014  . Essential hypertension 04/12/2014  . Asthma, mild intermittent 04/12/2014  . Insomnia 04/12/2014  . Genital herpes 04/12/2014  . Chronic nausea 04/12/2014  . History of colonic polyps 04/12/2014  . Rhinitis, allergic 04/12/2014  . Atherosclerosis of abdominal aorta ([place]) 04/12/2014    Earlie Counts, PT 04/12/19 9:29 AM   [facility] [address] [place],  [place], [address] Phone: [phone]   Fax:  [phone]  Name: Shelby Randolph MRN: [id number] Date of Birth: Jul 18, 1950

## 2019-04-12 NOTE — Patient Instructions (Addendum)
Slow Contraction: Gravity Resisted (Sitting)    Sitting, slowly squeeze pelvic floor for _5__ seconds. Rest for _10__ seconds. Repeat _5__ times. Do _5__ times a day.  Copyright  VHI. All rights reserved.  Slow Contraction: Gravity Resisted (Standing)    Standing, slowly squeeze pelvic floor for _5__ seconds. Rest for _10__ seconds. Repeat __5_ times. Do _3__ times a day.  Copyright  VHI. All rights reserved.  Step: Phase 1    Squeeze pelvic floor and hold. Lift leg with bent knee. Relax. Repeat _10__ times. Do _2__ times a day.  Copyright  VHI. All rights reserved.  [facility] [address], [place], [place] [address] Phone # [phone] Fax [phone]

## 2019-04-14 DIAGNOSIS — M25562 Pain in left knee: Secondary | ICD-10-CM | POA: Diagnosis not present

## 2019-04-15 ENCOUNTER — Other Ambulatory Visit: Payer: Self-pay | Admitting: Medical

## 2019-04-17 ENCOUNTER — Ambulatory Visit: Payer: PPO | Admitting: Physical Therapy

## 2019-04-19 ENCOUNTER — Other Ambulatory Visit: Payer: Self-pay | Admitting: Medical

## 2019-04-24 ENCOUNTER — Ambulatory Visit: Payer: PPO | Admitting: Physical Therapy

## 2019-04-24 ENCOUNTER — Other Ambulatory Visit: Payer: Self-pay

## 2019-04-24 ENCOUNTER — Encounter: Payer: Self-pay | Admitting: Physical Therapy

## 2019-04-24 DIAGNOSIS — R159 Full incontinence of feces: Secondary | ICD-10-CM

## 2019-04-24 DIAGNOSIS — R278 Other lack of coordination: Secondary | ICD-10-CM

## 2019-04-24 DIAGNOSIS — M6281 Muscle weakness (generalized): Secondary | ICD-10-CM

## 2019-04-24 NOTE — Therapy (Signed)
[facility] [address], [place], [place], [address] Phone: [phone]   Fax:  [phone]  Physical Therapy Treatment  Patient Details  Name: Shelby Randolph MRN: [id number] Date of Birth: 1950/12/16 Referring Provider (PT): Dr. Silvano Rusk   Encounter Date: 04/24/2019  PT End of Session - 04/24/19 1529    Visit Number  6    Date for PT Re-Evaluation  06/12/19    Authorization Type  Health Team    PT Start Time  3953    PT Stop Time  1608    PT Time Calculation (min)  38 min    Activity Tolerance  Patient tolerated treatment well    Behavior During Therapy  [facility] for tasks assessed/performed       Past Medical History:  Diagnosis Date  . Adenomatous colon polyp   . Allergy   . Anemia   . Anxiety   . Arthritis   . Asthma   . Atherosclerosis of aorta ([place]) 03/2014   abnormal on ultrasound, repeat US within 5 years  . Cancer ([place])    Skin cancer squamus   . Depression   . Diverticulosis   . Eye injury    in college, some pertinent loss of vision in left eye  . Genital herpes   . GERD (gastroesophageal reflux disease)   . History of bone density study 09/2011   normal  . History of mammogram    Dr. Radene Knee  . History of melanoma    sees [facility], dermatology  . History of PFTs 01/20/2011   moderate airflow limitation, no significant response to bronchodilator  . Hyperlipidemia   . Hypertension 12/2011  . Insomnia   . Migraines    Dr. Melton Alar, migraines  . MSH6-related Lynch syndrome (HNPCC5) 08/20/2017  . Nearsightedness    wears glasses  . Osteoporosis   . Post-operative nausea and vomiting   . Routine gynecological examination    Dr. Radene Knee  . Zika virus disease 06/2014    Past Surgical History:  Procedure Laterality Date  . COLONOSCOPY  2016   Dr. Silvano Rusk, 2014 Dr. Earlean Shawl  . ESOPHAGOGASTRODUODENOSCOPY    . ROTATOR CUFF REPAIR Right 05/2010  . TONSILLECTOMY AND ADENOIDECTOMY      as a kid  . TUBAL LIGATION  1985  . VAGINAL HYSTERECTOMY  1998   total; abnormal peroids  . VARICOSE VEIN SURGERY     bilat    There were no vitals filed for this visit.  Subjective Assessment - 04/24/19 1533    Subjective  My knee feels better after the injection. I have gotten back into walking now. I am back into doing the pelvic floor exercises. I feel better without taking medicine and I have less leakage and my stools are harder.    Patient Stated Goals  get control of bowels and reduce leakage    Currently in Pain?  No/denies    Multiple Pain Sites  No         [facility] PT Assessment - 04/24/19 0001      Assessment   Medical Diagnosis  K62.89, N81.89 Anorectal dysfunction due to pelvic organ prolapse    Referring Provider (PT)  Dr. 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, all sitting    Leisure  read, hiking      Cognition   Overall Cognitive Status  Within Functional Limits for tasks assessed      AROM   Lumbar Extension  decreased by 25%      Strength   Right Hip Extension  4/5    Right Hip ABduction  4/5    Left Hip Flexion  4/5    Left Hip Extension  4/5    Left Hip Internal Rotation  4/5    Left Hip ABduction  4/5      Palpation   SI assessment   ASIS is equal                Pelvic Floor Special Questions - 04/24/19 0001    Urinary Leakage  No    Fecal incontinence  Yes    Strength  weak squeeze, no lift   hold now for 6 seconds compared to 1 sec   Biofeedback  sitting-contract toes and contract for 10 sec with the contraction going up and down, standing contract the pelvic floor  holding fro 10  sec. walking with pelvic floor contraction, step up on 4 inch step with pelvic floor contraction    Biofeedback sensor type  Surface   rectum       [facility] Adult PT Treatment/Exercise - 04/24/19 0001      Knee/Hip Exercises: Aerobic   Recumbent Bike  level 1 for 7 minutes while assessing patient             PT Education - 04/24/19 1614    Education Details  pelvic floor contraction with walking and steps    Person(s) Educated  Patient    Methods  Explanation;Demonstration;Verbal cues;Handout    Comprehension  Returned demonstration;Verbalized understanding       PT Short Term Goals - 03/29/19 1657      PT SHORT TERM GOAL #1   Title  independent with initial HEP    Time  4    Period  Weeks    Status  Achieved    Target Date  03/27/19      PT SHORT TERM GOAL #2   Title  able to contract the anal sphincter for 5 seconds in sidely due to increased endurance    Time  4    Period  Weeks    Status  Achieved    Target Date  03/20/19      PT SHORT TERM GOAL #3   Title  fecal leakage decreased >/= 25% due to improved pelvic floor and core strength    Time  4    Period  Weeks    Status  On-going    Target Date  03/20/19        PT Long Term Goals - 04/24/19 1614      PT LONG TERM GOAL #1   Title  independent with advanced HEP    Time  8    Period  Weeks    Status  On-going      PT LONG TERM GOAL #2   Title  pelvic floor strength >/= 3/5 for 15 seconds due to improved endurance and coordination    Baseline  2/5 holding for 8 sec  Time  8    Period  Weeks    Status  On-going      PT LONG TERM GOAL #3   Title  fecal leakage decreased >/= 75% due to improve strength and coordination of the pelvic floor muscles    Baseline  less and stool is firmer    Time  8    Period  Weeks    Status  On-going      PT LONG TERM GOAL #4   Title  able to hold the urge to have a bowel movement for 5-10 minutes to give her time to get to the bathroom while hiking    Time  8     Period  Weeks            Plan - 04/24/19 1717    Clinical Impression Statement  Patient is now able to contract the pelvic floor for 5 seconds in sitting and standing. Patient has increased strength of the pelvic floor and endurance. Patient is able to contract the pelvic floor in standing, going up a step to 8 uv now. Patient has increased control of the pelvic floor. Patient reports less leakage and increased firmness of the stool. Patient will benefit from skilled therapy to improve strength and coordination fo the pelvic floor.    Personal Factors and Comorbidities  Age;Comorbidity 1;Comorbidity 2;Comorbidity 3+    Comorbidities  s/p vaginal hysterectomy, diverticulosis, osteoporosis, melanoma, lynch syndrome    Examination-Activity Limitations  Toileting;Continence    Examination-Participation Restrictions  Community Activity;Interpersonal Relationship    Stability/Clinical Decision Making  Evolving/Moderate complexity    Rehab Potential  Excellent    PT Frequency  1x / week    PT Duration  8 weeks    PT Treatment/Interventions  Biofeedback;Therapeutic activities;Therapeutic exercise;Patient/family education;Neuromuscular re-education;Manual techniques;Passive range of motion    PT Next Visit Plan  core strength and pelvic floor strength with EMG in standing with squatting    PT Home Exercise Plan  Access Code: [email or web address]    Consulted and Agree with Plan of Care  Patient       Patient will benefit from skilled therapeutic intervention in order to improve the following deficits and impairments:  Decreased coordination, Increased fascial restricitons, Decreased strength  Visit Diagnosis: Muscle weakness (generalized) - Plan: PT plan of care cert/re-cert  Other lack of coordination - Plan: PT plan of care cert/re-cert  Incontinence of feces, unspecified fecal incontinence type - Plan: PT plan of care cert/re-cert     Problem List Patient Active Problem List   Diagnosis Date  Noted  . Post-menopausal 07/07/2018  . Estrogen deficiency 07/07/2018  . Vaccine counseling 07/07/2018  . Need for pneumococcal vaccination 07/07/2018  . Osteopenia 07/07/2018  . Skin lesion 07/07/2018  . Leg cramping 01/24/2018  . Varicose veins of both lower extremities 01/24/2018  . Monoallelic mutation of MSH6 gene 09/14/2017  . Lynch syndrome 08/20/2017  . Toenail deformity 07/06/2017  . Medicare annual wellness visit, subsequent 06/25/2016  . Left hip pain 06/25/2016  . Encounter for health maintenance examination in adult 05/23/2015  . Generalized anxiety disorder 05/23/2015  . Vitamin D deficiency 05/23/2015  . IBS (irritable bowel syndrome) 06/11/2014  . Gallstones 04/12/2014  . Dyslipidemia 04/12/2014  . Essential hypertension 04/12/2014  . Asthma, mild intermittent 04/12/2014  . Insomnia 04/12/2014  . Genital herpes 04/12/2014  . Chronic nausea 04/12/2014  . History of colonic polyps 04/12/2014  . Rhinitis, allergic 04/12/2014  . Atherosclerosis of abdominal aorta ([place]) 04/12/2014  Earlie Counts, PT 04/24/19 5:23 PM   [facility] [address], [place], [place], [address] Phone: [phone]   Fax:  [phone]  Name: IRIANA ARTLEY MRN: [id number] Date of Birth: 1950/08/20

## 2019-04-24 NOTE — Patient Instructions (Addendum)
Step: Phase 6    Squeeze pelvic floor and hold. Climb up and down steps. Repeat __5_ times. Do _1__ times a day.  Copyright  VHI. All rights reserved.    Step: Phase 3    Stand facing step. Squeeze pelvic floor and hold. Lift leg, step up and follow with other leg. Return. Relax. Repeat __5_ times. Do __1_ times a day. Going up steps and down Copyright  VHI. All rights reserved.   Walking: Phase 6    While walking, squeeze pelvic floor and hold. Walk for ___ feet or _5__ seconds. Rest for _5__ seconds. Repeat __5_ times. Do _3__ times during walk Copyright  VHI. All rights reserved.  [facility] [address] [place], [place] [address] Phone # [phone] Fax [phone]

## 2019-05-10 ENCOUNTER — Ambulatory Visit: Payer: PPO | Admitting: Physical Therapy

## 2019-05-10 ENCOUNTER — Encounter: Payer: Self-pay | Admitting: Physical Therapy

## 2019-05-10 ENCOUNTER — Other Ambulatory Visit: Payer: Self-pay

## 2019-05-10 DIAGNOSIS — R278 Other lack of coordination: Secondary | ICD-10-CM

## 2019-05-10 DIAGNOSIS — M6281 Muscle weakness (generalized): Secondary | ICD-10-CM | POA: Diagnosis not present

## 2019-05-10 DIAGNOSIS — R159 Full incontinence of feces: Secondary | ICD-10-CM

## 2019-05-10 NOTE — Patient Instructions (Addendum)
Step Down (Standing)    Standing, tighten pelvic floor and hold for _2__ seconds. Release contraction by half and Hold for _2__ seconds. Relax. Repeat _5__ times. Do _2__ times a day.  Copyright  VHI. All rights reserved.  Step: Phase 6    Squeeze pelvic floor and hold. Climb up and down steps. Repeat __5_ times. Do _1__ times a day.  Copyright  VHI. All rights reserved.    Step: Phase 3    Stand facing step. Squeeze pelvic floor and hold. Lift leg, step up and follow with other leg. Return. Relax. Repeat __5_ times. Do __1_ times a day. Going up steps and down Copyright  VHI. All rights reserved.   Walking: Phase 6    While walking, squeeze pelvic floor and hold. Walk for ___ feet or _5__ seconds. Rest for _5__ seconds. Repeat __5_ times. Do _3__ times during walk  Slow Contraction: Gravity Resisted (Sitting)    Sitting, slowly squeeze pelvic floor for _20__ seconds. Rest for __5_ seconds. Repeat _5__ times. Do _2__ times a day.  Copyright  VHI. All rights reserved.  [facility] [address], [place], [place] [address] Phone # [phone] Fax [phone]

## 2019-05-10 NOTE — Therapy (Signed)
[facility] [address], [place], [place], [address] Phone: [phone]   Fax:  [phone]  Physical Therapy Treatment  Patient Details  Name: Shelby Randolph MRN: [id number] Date of Birth: 1950/12/10 Referring Provider (PT): Dr. Silvano Rusk   Encounter Date: 05/10/2019  PT End of Session - 05/10/19 0801    Visit Number  7    Date for PT Re-Evaluation  06/12/19    Authorization Type  Health Team    PT Start Time  0800    PT Stop Time  0840    PT Time Calculation (min)  40 min    Activity Tolerance  Patient tolerated treatment well    Behavior During Therapy  [facility] for tasks assessed/performed       Past Medical History:  Diagnosis Date  . Adenomatous colon polyp   . Allergy   . Anemia   . Anxiety   . Arthritis   . Asthma   . Atherosclerosis of aorta ([place]) 03/2014   abnormal on ultrasound, repeat US within 5 years  . Cancer ([place])    Skin cancer squamus   . Depression   . Diverticulosis   . Eye injury    in college, some pertinent loss of vision in left eye  . Genital herpes   . GERD (gastroesophageal reflux disease)   . History of bone density study 09/2011   normal  . History of mammogram    Dr. Radene Knee  . History of melanoma    sees [facility], dermatology  . History of PFTs 01/20/2011   moderate airflow limitation, no significant response to bronchodilator  . Hyperlipidemia   . Hypertension 12/2011  . Insomnia   . Migraines    Dr. Melton Alar, migraines  . MSH6-related Lynch syndrome (HNPCC5) 08/20/2017  . Nearsightedness    wears glasses  . Osteoporosis   . Post-operative nausea and vomiting   . Routine gynecological examination    Dr. Radene Knee  . Zika virus disease 06/2014    Past Surgical History:  Procedure Laterality Date  . COLONOSCOPY  2016   Dr. Silvano Rusk, 2014 Dr. Earlean Shawl  . ESOPHAGOGASTRODUODENOSCOPY    . ROTATOR CUFF REPAIR Right 05/2010  . TONSILLECTOMY AND ADENOIDECTOMY      as a kid  . TUBAL LIGATION  1985  . VAGINAL HYSTERECTOMY  1998   total; abnormal peroids  . VARICOSE VEIN SURGERY     bilat    There were no vitals filed for this visit.  Subjective Assessment - 05/10/19 0803    Subjective  The leakage is better. I only had leakage 1 time and last week was good. I have not had to take any of the olesteron. I am back at work.    Patient Stated Goals  get control of bowels and reduce leakage    Currently in Pain?  No/denies                    Pelvic Floor Special Questions - 05/10/19 0001    Biofeedback  sitting  contract 20 sec 15 uv 5 times wtih VC to breath, stand contraction 10 sec 5 times,  squat hold 5 sec with pelvic floor contraction, tighten pelvic floor in standing the let go a little bit at a time    Biofeedback sensor type  Surface   rectum       [facility] Adult PT Treatment/Exercise - 05/10/19 0001      Neuro Re-ed  Neuro Re-ed Details   standing pelvic lforo contraction with step down, contraction holdingin for 10 seconds, sitting contract for 20 seconds,              PT Education - 05/10/19 0833    Education Details  sitting and standing pelvic lfoor contraction working on control    Person(s) Educated  Patient    Methods  Explanation;Demonstration;Handout    Comprehension  Returned demonstration;Verbalized understanding       PT Short Term Goals - 03/29/19 1657      PT SHORT TERM GOAL #1   Title  independent with initial HEP    Time  4    Period  Weeks    Status  Achieved    Target Date  03/27/19      PT SHORT TERM GOAL #2   Title  able to contract the anal sphincter for 5 seconds in sidely due to increased endurance    Time  4    Period  Weeks    Status  Achieved    Target Date  03/20/19      PT SHORT TERM GOAL #3   Title  fecal leakage decreased >/= 25% due to improved pelvic floor and core strength    Time  4    Period  Weeks    Status  On-going    Target Date  03/20/19        PT  Long Term Goals - 05/10/19 0806      PT LONG TERM GOAL #3   Title  fecal leakage decreased >/= 75% due to improve strength and coordination of the pelvic floor muscles    Baseline  70% better    Time  8    Period  Weeks    Status  On-going      PT LONG TERM GOAL #4   Title  able to hold the urge to have a bowel movement for 5-10 minutes to give her time to get to the bathroom while hiking    Baseline  most of the time    Time  8    Period  Weeks    Status  On-going            Plan - 05/10/19 8588    Clinical Impression Statement  Patient only had one instance of leakage this week. She had one blowout the week prior. Patient reports her leakage is 70% better. Patient is able to hold a pelvic floor contraction in sitting for 20 seconds at 15 uv. Patient is able to hold a pelvic floor contraction for 10 seconds at 15 uv. Patient is able to control her contraction better. Patient has difficulty with letting go of a pelvic floor contraction slowly. Patient will benefit from skilled therapy to improve strength and coordination of the pelvic floor.    Personal Factors and Comorbidities  Age;Comorbidity 1;Comorbidity 2;Comorbidity 3+    Comorbidities  s/p vaginal hysterectomy, diverticulosis, osteoporosis, melanoma, lynch syndrome    Examination-Activity Limitations  Toileting;Continence    Examination-Participation Restrictions  Community Activity;Interpersonal Relationship    Stability/Clinical Decision Making  Evolving/Moderate complexity    Rehab Potential  Excellent    PT Frequency  1x / week    PT Duration  8 weeks    PT Treatment/Interventions  Biofeedback;Therapeutic activities;Therapeutic exercise;Patient/family education;Neuromuscular re-education;Manual techniques;Passive range of motion    PT Next Visit Plan  core strength and pelvic floor strength with EMG in standing with squatting; work on letting go of contraction  slowly    PT Home Exercise Plan  Access Code: [email or web address]     Consulted and Agree with Plan of Care  Patient       Patient will benefit from skilled therapeutic intervention in order to improve the following deficits and impairments:  Decreased coordination, Increased fascial restricitons, Decreased strength  Visit Diagnosis: Muscle weakness (generalized)  Other lack of coordination  Incontinence of feces, unspecified fecal incontinence type     Problem List Patient Active Problem List   Diagnosis Date Noted  . Post-menopausal 07/07/2018  . Estrogen deficiency 07/07/2018  . Vaccine counseling 07/07/2018  . Need for pneumococcal vaccination 07/07/2018  . Osteopenia 07/07/2018  . Skin lesion 07/07/2018  . Leg cramping 01/24/2018  . Varicose veins of both lower extremities 01/24/2018  . Monoallelic mutation of MSH6 gene 09/14/2017  . Lynch syndrome 08/20/2017  . Toenail deformity 07/06/2017  . Medicare annual wellness visit, subsequent 06/25/2016  . Left hip pain 06/25/2016  . Encounter for health maintenance examination in adult 05/23/2015  . Generalized anxiety disorder 05/23/2015  . Vitamin D deficiency 05/23/2015  . IBS (irritable bowel syndrome) 06/11/2014  . Gallstones 04/12/2014  . Dyslipidemia 04/12/2014  . Essential hypertension 04/12/2014  . Asthma, mild intermittent 04/12/2014  . Insomnia 04/12/2014  . Genital herpes 04/12/2014  . Chronic nausea 04/12/2014  . History of colonic polyps 04/12/2014  . Rhinitis, allergic 04/12/2014  . Atherosclerosis of abdominal aorta ([place]) 04/12/2014    Earlie Counts, PT 05/10/19 8:42 AM   [facility] [address] [place], [place], [address] Phone: [phone]   Fax:  [phone]  Name: MAKYNNA MANOCCHIO MRN: [id number] Date of Birth: 05-01-1951

## 2019-05-18 DIAGNOSIS — M25562 Pain in left knee: Secondary | ICD-10-CM | POA: Diagnosis not present

## 2019-05-22 ENCOUNTER — Institutional Professional Consult (permissible substitution): Payer: PPO | Admitting: Pulmonary Disease

## 2019-05-23 ENCOUNTER — Other Ambulatory Visit: Payer: Self-pay

## 2019-05-23 ENCOUNTER — Ambulatory Visit: Payer: PPO | Attending: Internal Medicine | Admitting: Physical Therapy

## 2019-05-23 ENCOUNTER — Encounter: Payer: Self-pay | Admitting: Physical Therapy

## 2019-05-23 DIAGNOSIS — R159 Full incontinence of feces: Secondary | ICD-10-CM

## 2019-05-23 DIAGNOSIS — R278 Other lack of coordination: Secondary | ICD-10-CM | POA: Insufficient documentation

## 2019-05-23 DIAGNOSIS — M6281 Muscle weakness (generalized): Secondary | ICD-10-CM | POA: Insufficient documentation

## 2019-05-23 NOTE — Therapy (Addendum)
[facility] [address], [place], [place], [address] Phone: [phone]   Fax:  [phone]  Physical Therapy Treatment  Patient Details  Name: Shelby Randolph MRN: [id number] Date of Birth: [birth year] Referring Provider (PT): Dr. Silvano Rusk   Encounter Date: 05/23/2019  PT End of Session - 05/23/19 1526    Visit Number  8    Date for PT Re-Evaluation  06/12/19    Authorization Type  Health Team    PT Start Time  2620    PT Stop Time  1610    PT Time Calculation (min)  40 min    Activity Tolerance  Patient tolerated treatment well    Behavior During Therapy  [facility] for tasks assessed/performed       Past Medical History:  Diagnosis Date  . Adenomatous colon polyp   . Allergy   . Anemia   . Anxiety   . Arthritis   . Asthma   . Atherosclerosis of aorta ([place]) 03/2014   abnormal on ultrasound, repeat US within 5 years  . Cancer ([place])    Skin cancer squamus   . Depression   . Diverticulosis   . Eye injury    in college, some pertinent loss of vision in left eye  . Genital herpes   . GERD (gastroesophageal reflux disease)   . History of bone density study 09/2011   normal  . History of mammogram    Dr. Radene Knee  . History of melanoma    sees [facility], dermatology  . History of PFTs 01/20/2011   moderate airflow limitation, no significant response to bronchodilator  . Hyperlipidemia   . Hypertension 12/2011  . Insomnia   . Migraines    Dr. Melton Alar, migraines  . MSH6-related Lynch syndrome (HNPCC5) 08/20/2017  . Nearsightedness    wears glasses  . Osteoporosis   . Post-operative nausea and vomiting   . Routine gynecological examination    Dr. Radene Knee  . Zika virus disease 06/2014    Past Surgical History:  Procedure Laterality Date  . COLONOSCOPY  2016   Dr. Silvano Rusk, 2014 Dr. Earlean Shawl  . ESOPHAGOGASTRODUODENOSCOPY    . ROTATOR CUFF REPAIR Right 05/2010  . TONSILLECTOMY AND ADENOIDECTOMY     as a kid  . TUBAL LIGATION  1985  . VAGINAL HYSTERECTOMY  1998   total; abnormal peroids  . VARICOSE VEIN SURGERY     bilat    There were no vitals filed for this visit.  Subjective Assessment - 05/23/19 1529    Subjective  I am having a MRI on Thursday on my knee. I was not feeling good so I went back on the medication. The past 2 days have been better.    Patient Stated Goals  get control of bowels and reduce leakage    Currently in Pain?  Yes    Pain Score  5     Pain Location  Knee    Pain Orientation  Left    Pain Descriptors / Indicators  Tightness    Pain Type  Acute pain    Pain Onset  1 to 4 weeks ago    Pain Frequency  Intermittent    Aggravating Factors   walking    Pain Relieving Factors  rest    Multiple Pain Sites  No                    Pelvic Floor Special Questions - 05/23/19 0001  Biofeedback  sitting  pelvic floor contraction 5-10 sec, going up a ramp with contraction, standing  pellvic floor contraction with ramp and 5-10 seconds    Biofeedback sensor type  Surface   rectum       [facility] Adult PT Treatment/Exercise - 05/23/19 0001      Knee/Hip Exercises: Aerobic   Recumbent Bike  level 1 for 6 minutes while assessing patient               PT Short Term Goals - 03/29/19 1657      PT SHORT TERM GOAL #1   Title  independent with initial HEP    Time  4    Period  Weeks    Status  Achieved    Target Date  03/27/19      PT SHORT TERM GOAL #2   Title  able to contract the anal sphincter for 5 seconds in sidely due to increased endurance    Time  4    Period  Weeks    Status  Achieved    Target Date  03/20/19      PT SHORT TERM GOAL #3   Title  fecal leakage decreased >/= 25% due to improved pelvic floor and core strength    Time  4    Period  Weeks    Status  On-going    Target Date  03/20/19        PT Long Term Goals - 05/10/19 0806      PT LONG TERM GOAL #3   Title  fecal leakage decreased >/= 75% due to improve  strength and coordination of the pelvic floor muscles    Baseline  70% better    Time  8    Period  Weeks    Status  On-going      PT LONG TERM GOAL #4   Title  able to hold the urge to have a bowel movement for 5-10 minutes to give her time to get to the bathroom while hiking    Baseline  most of the time    Time  8    Period  Weeks    Status  On-going            Plan - 05/23/19 1607    Clinical Impression Statement  Patient had increased diarrhea this week and not sure why. She has resumed her medication program and it is increased thickness of the stool and she is doing better. Patient is ale to contract for 10-15 seconds in sitting and standing. Patient has difficulty with contracting going up a ramp with control. Patient able to relax to 2 uv and contract to 10 uv. Patient will benefit from skilled therapy to improve strength and coordination of the pelvic floor.    Personal Factors and Comorbidities  Age;Comorbidity 1;Comorbidity 2;Comorbidity 3+    Comorbidities  s/p vaginal hysterectomy, diverticulosis, osteoporosis, melanoma, lynch syndrome    Examination-Activity Limitations  Toileting;Continence    Examination-Participation Restrictions  Community Activity;Interpersonal Relationship    Stability/Clinical Decision Making  Evolving/Moderate complexity    Rehab Potential  Excellent    PT Frequency  1x / week    PT Duration  8 weeks    PT Treatment/Interventions  Biofeedback;Therapeutic activities;Therapeutic exercise;Patient/family education;Neuromuscular re-education;Manual techniques;Passive range of motion    PT Next Visit Plan  core strength and pelvic floor strength with EMG in standing with squatting; ramp up and down, work on control    PT Home Exercise Plan  Access Code: [email or web address]    Consulted and Agree with Plan of Care  Patient       Patient will benefit from skilled therapeutic intervention in order to improve the following deficits and impairments:  Decreased  coordination, Increased fascial restricitons, Decreased strength  Visit Diagnosis: Muscle weakness (generalized)  Other lack of coordination  Incontinence of feces, unspecified fecal incontinence type     Problem List Patient Active Problem List   Diagnosis Date Noted  . Post-menopausal 07/07/2018  . Estrogen deficiency 07/07/2018  . Vaccine counseling 07/07/2018  . Need for pneumococcal vaccination 07/07/2018  . Osteopenia 07/07/2018  . Skin lesion 07/07/2018  . Leg cramping 01/24/2018  . Varicose veins of both lower extremities 01/24/2018  . Monoallelic mutation of MSH6 gene 09/14/2017  . Lynch syndrome 08/20/2017  . Toenail deformity 07/06/2017  . Medicare annual wellness visit, subsequent 06/25/2016  . Left hip pain 06/25/2016  . Encounter for health maintenance examination in adult 05/23/2015  . Generalized anxiety disorder 05/23/2015  . Vitamin D deficiency 05/23/2015  . IBS (irritable bowel syndrome) 06/11/2014  . Gallstones 04/12/2014  . Dyslipidemia 04/12/2014  . Essential hypertension 04/12/2014  . Asthma, mild intermittent 04/12/2014  . Insomnia 04/12/2014  . Genital herpes 04/12/2014  . Chronic nausea 04/12/2014  . History of colonic polyps 04/12/2014  . Rhinitis, allergic 04/12/2014  . Atherosclerosis of abdominal aorta ([place]) 04/12/2014    Earlie Counts, PT 05/23/19 4:14 PM    [facility] [address], [place], [place], [address] Phone: [phone]   Fax:  [phone]  Name: Shelby Randolph MRN: [id number] Date of Birth: 1950/11/18 PHYSICAL THERAPY DISCHARGE SUMMARY  Visits from Start of Care: 8  Current functional level related to goals / functional outcomes: See above.    Remaining deficits: See above. Patient did not return to physical therapy since 05/23/2019.    Education / Equipment: HEP Plan: Patient agrees to discharge.  Patient goals were not met. Patient is being  discharged due to not returning since the last visit. Thank you for the referral. Earlie Counts, PT 07/03/19 10:06 AM   ?????

## 2019-05-25 DIAGNOSIS — M25562 Pain in left knee: Secondary | ICD-10-CM | POA: Diagnosis not present

## 2019-06-02 ENCOUNTER — Other Ambulatory Visit: Payer: Self-pay | Admitting: Internal Medicine

## 2019-06-05 ENCOUNTER — Ambulatory Visit: Payer: PPO | Admitting: Physical Therapy

## 2019-06-06 ENCOUNTER — Ambulatory Visit: Payer: PPO

## 2019-06-08 DIAGNOSIS — M1712 Unilateral primary osteoarthritis, left knee: Secondary | ICD-10-CM | POA: Diagnosis not present

## 2019-06-08 DIAGNOSIS — M25562 Pain in left knee: Secondary | ICD-10-CM | POA: Diagnosis not present

## 2019-06-12 DIAGNOSIS — M25562 Pain in left knee: Secondary | ICD-10-CM | POA: Diagnosis not present

## 2019-06-13 ENCOUNTER — Institutional Professional Consult (permissible substitution): Payer: PPO | Admitting: Pulmonary Disease

## 2019-06-13 ENCOUNTER — Ambulatory Visit: Payer: PPO | Admitting: Critical Care Medicine

## 2019-06-13 ENCOUNTER — Other Ambulatory Visit: Payer: Self-pay

## 2019-06-13 ENCOUNTER — Encounter: Payer: Self-pay | Admitting: Critical Care Medicine

## 2019-06-13 VITALS — BP 116/70 | HR 64 | Temp 97.0°F | Ht 68.0 in | Wt 177.4 lb

## 2019-06-13 DIAGNOSIS — Z6828 Body mass index (BMI) 28.0-28.9, adult: Secondary | ICD-10-CM | POA: Diagnosis not present

## 2019-06-13 DIAGNOSIS — J4530 Mild persistent asthma, uncomplicated: Secondary | ICD-10-CM

## 2019-06-13 DIAGNOSIS — J309 Allergic rhinitis, unspecified: Secondary | ICD-10-CM | POA: Diagnosis not present

## 2019-06-13 DIAGNOSIS — Z1231 Encounter for screening mammogram for malignant neoplasm of breast: Secondary | ICD-10-CM | POA: Diagnosis not present

## 2019-06-13 DIAGNOSIS — Z124 Encounter for screening for malignant neoplasm of cervix: Secondary | ICD-10-CM | POA: Diagnosis not present

## 2019-06-13 LAB — HM PAP SMEAR: HM Pap smear: NEGATIVE

## 2019-06-13 MED ORDER — FLUTICASONE-SALMETEROL 250-50 MCG/DOSE IN AEPB: 1.0000 | INHALATION_SPRAY | Freq: Two times a day (BID) | RESPIRATORY_TRACT | 11 refills | Status: DC

## 2019-06-13 MED ORDER — MONTELUKAST SODIUM 10 MG PO TABS: 10.0000 mg | ORAL_TABLET | Freq: Every day | ORAL | 11 refills | Status: DC

## 2019-06-13 MED ORDER — ALBUTEROL SULFATE HFA 108 (90 BASE) MCG/ACT IN AERS: 2.0000 | INHALATION_SPRAY | RESPIRATORY_TRACT | 5 refills | Status: DC | PRN

## 2019-06-13 NOTE — Patient Instructions (Addendum)
Thank you for visiting Dr. Carlis Abbott at [facility]. We recommend the following: Orders Placed This Encounter  Procedures  . Pulmonary function test   Orders Placed This Encounter  Procedures  . Pulmonary function test    Standing Status:   Future    Standing Expiration Date:   06/12/2020    Order Specific Question:   Where should this test be performed?    Answer:   [facility]    Order Specific Question:   Full PFT: includes the following: basic spirometry, spirometry pre & post bronchodilator, diffusion capacity (DLCO), lung volumes    Answer:   Full PFT    Meds ordered this encounter  Medications  . Fluticasone-Salmeterol (ADVAIR DISKUS) 250-50 MCG/DOSE AEPB    Sig: Inhale 1 puff into the lungs 2 (two) times daily.    Dispense:  60 each    Refill:  11  . albuterol (VENTOLIN HFA) 108 (90 Base) MCG/ACT inhaler    Sig: Inhale 2 puffs into the lungs every 4 (four) hours as needed for wheezing or shortness of breath.    Dispense:  18 g    Refill:  5  . montelukast (SINGULAIR) 10 MG tablet    Sig: Take 1 tablet (10 mg total) by mouth at bedtime.    Dispense:  30 tablet    Refill:  11    Return in about 6 weeks (around 07/25/2019).    Please do your part to reduce the spread of COVID-19.

## 2019-06-13 NOTE — Progress Notes (Signed)
Synopsis: Referred in 2021 for asthma by Carlena Hurl, PA-C  Subjective:   PATIENT ID: Shelby Randolph GENDER: female DOB: 1950/05/18, MRN: [id number]  Chief Complaint  Patient presents with  . Consult    Patient is here for shortness of breath all the time and exertion makes it worse. Patient is on Advair and gets relief from that.     Shelby Randolph is a 69 year old woman who presents for evaluation of dyspnea on exertion.  Shelby Randolph has a history of asthma requiring intermittent LABA-ICS, which was fairly well controlled for several years.  For the last several months Shelby Randolph has had worse dyspnea on exertion that limits her physical activity.  Earlier when Shelby Randolph was exercising Shelby Randolph had to stop after walking up a hill.  Shelby Randolph walks most days, about 25 to 30 miles per week with her dogs.  Shelby Randolph no longer cycles.  In the past Shelby Randolph had worse symptoms, but improved on regular LABA-ICS, and Shelby Randolph was able to go off her inhaler for several years.  During Covid Shelby Randolph has gained some weight and has been working out less frequently.  Shelby Randolph seldom uses albuterol, only if Shelby Randolph really needs it.  Shelby Randolph has dyspnea on exertion, wheezing, chest congestion and significant mucus production, frequent throat clearing, but no significant cough.  Shelby Randolph has frequent throat clearing, postnasal drip, and allergies.  Shelby Randolph has a history of allergies-including to dogs & evergreen trees.  Shelby Randolph has 2 bulldogs.  Weather changes seem to trigger her symptoms.  No nocturnal symptoms.  Previous cardiac evaluation has been negative.  Shelby Randolph is a never smoker/vaper.  Is scheduled for her first Covid vaccine later this week.    Past Medical History:  Diagnosis Date  . Adenomatous colon polyp   . Allergy   . Anemia   . Anxiety   . Arthritis   . Asthma   . Atherosclerosis of aorta ([place]) 03/2014   abnormal on ultrasound, repeat US within 5 years  . Cancer ([place])    Skin cancer squamus   . Depression   . Diverticulosis   . Eye injury    in college,  some pertinent loss of vision in left eye  . Genital herpes   . GERD (gastroesophageal reflux disease)   . History of bone density study 09/2011   normal  . History of mammogram    Dr. Radene Knee  . History of melanoma    sees [facility], dermatology  . History of PFTs 01/20/2011   moderate airflow limitation, no significant response to bronchodilator  . Hyperlipidemia   . Hypertension 12/2011  . Insomnia   . Migraines    Dr. Melton Alar, migraines  . MSH6-related Lynch syndrome (HNPCC5) 08/20/2017  . Nearsightedness    wears glasses  . Osteoporosis   . Post-operative nausea and vomiting   . Routine gynecological examination    Dr. Radene Knee  . Zika virus disease 06/2014     Family History  Problem Relation Age of Onset  . COPD Mother        died age 65yo.  emphysema  . Colon cancer Father 88        died age 37yo  . Colon cancer Brother 109  . Breast cancer Maternal Aunt   . Colon cancer Paternal Uncle        2  . Breast cancer Daughter   . Colon cancer Paternal Grandmother 26       colon cancer   . Diabetes Neg Hx   .  Heart disease Neg Hx   . Hypertension Neg Hx   . Stroke Neg Hx   . Rectal cancer Neg Hx   . Stomach cancer Neg Hx      Past Surgical History:  Procedure Laterality Date  . COLONOSCOPY  2016   Dr. Silvano Rusk, 2014 Dr. Earlean Shawl  . ESOPHAGOGASTRODUODENOSCOPY    . ROTATOR CUFF REPAIR Right 05/2010  . TONSILLECTOMY AND ADENOIDECTOMY     as a kid  . TUBAL LIGATION  1985  . VAGINAL HYSTERECTOMY  1998   total; abnormal peroids  . VARICOSE VEIN SURGERY     bilat    Social History   Socioeconomic History  . Marital status: Widowed    Spouse name: Not on file  . Number of children: 5  . Years of education: Not on file  . Highest education level: Not on file  Occupational History  . Occupation: compliance agent (RN)    Employer: [employer]    Comment: Nurse Practitioner  Tobacco Use  . Smoking status: Never Smoker  . Smokeless tobacco: Never  Used  Substance and Sexual Activity  . Alcohol use: Yes    Alcohol/week: 2.0 standard drinks    Types: 2 Glasses of wine per week  . Drug use: No  . Sexual activity: Not on file    Comment: exercise - walks, [place], cardio at gym 3 x/wk, yoga, widowed   Other Topics Concern  . Not on file  Social History Narrative   Lives alone, has 5 children ([place], [place], [place]), has several grandchildren, daughter Junie Panning in [place] in the [employer], exercises 5 days per week - walking, kickboxing, yoga; [employer];  Works for [employer] in [employer]   Social Determinants of Health   Financial Resource Strain:   . Difficulty of Paying Living Expenses: Not on file  Food Insecurity:   . Worried About Charity fundraiser in the Last Year: Not on file  . Ran Out of Food in the Last Year: Not on file  Transportation Needs:   . Lack of Transportation (Medical): Not on file  . Lack of Transportation (Non-Medical): Not on file  Physical Activity:   . Days of Exercise per Week: Not on file  . Minutes of Exercise per Session: Not on file  Stress:   . Feeling of Stress : Not on file  Social Connections:   . Frequency of Communication with Friends and Family: Not on file  . Frequency of Social Gatherings with Friends and Family: Not on file  . Attends Religious Services: Not on file  . Active Member of Clubs or Organizations: Not on file  . Attends Archivist Meetings: Not on file  . Marital Status: Not on file  Intimate Partner Violence:   . Fear of Current or Ex-Partner: Not on file  . Emotionally Abused: Not on file  . Physically Abused: Not on file  . Sexually Abused: Not on file     Allergies  Allergen Reactions  . Sulfonamide Derivatives Shortness Of Breath and Swelling  . Mucinex [Guaifenesin Er] Other (See Comments)    "Jittery and hypes me up"     Immunization History  Administered Date(s) Administered  . Hepatitis A 12/09/2017  .  Hepatitis A, Adult 10/09/2017  . Hepatitis B 10/09/2017, 12/09/2017  . Influenza Split 02/16/2011  . Influenza Whole 02/21/2013  . Influenza-Unspecified 02/20/2014, 02/20/2015, 01/26/2017, 02/08/2018, 02/17/2019  . Meningococcal Conjugate 06/12/2017  . Pneumococcal Conjugate-13  05/23/2015  . Pneumococcal Polysaccharide-23 02/03/2012, 07/07/2018  . Pneumococcal-Unspecified 06/12/2017  . Td 07/18/2007  . Tdap 01/16/2011, 05/11/2017  . Varicella 04/10/2012  . Zoster 03/18/2011, 12/09/2017  . Zoster Recombinat (Shingrix) 12/15/2017, 02/13/2018    Outpatient Medications Prior to Visit  Medication Sig Dispense Refill  . acetaminophen (TYLENOL) 500 MG tablet 1-2 tablets twice to three times daily as needed. 100 tablet 5  . alosetron (LOTRONEX) 0.5 MG tablet TAKE TWO TABLETS TWICE DAILY 120 tablet 11  . aspirin EC 81 MG tablet Take 1 tablet (81 mg total) by mouth daily. 90 tablet 3  . atenolol (TENORMIN) 25 MG tablet TAKE ONE TABLET EACH DAY 90 tablet 3  . atorvastatin (LIPITOR) 20 MG tablet Take 1 tablet (20 mg total) by mouth daily. (Patient taking differently: Take 20 mg by mouth 3 (three) times a week. ) 90 tablet 3  . cetirizine (ZYRTEC) 10 MG tablet Take 10 mg by mouth daily.    . cholecalciferol (VITAMIN D3) 25 MCG (1000 UT) tablet Take 1 tablet (1,000 Units total) by mouth daily. 90 tablet 3  . hydrochlorothiazide (MICROZIDE) 12.5 MG capsule TAKE ONE CAPSULE EACH DAY 90 capsule 3  . metoCLOPramide (REGLAN) 5 MG tablet TAKE ONE TABLET TWICE DAILY (Patient taking differently: as needed. ) 180 tablet 0  . Multiple Vitamin (MULTIVITAMIN) capsule Take 1 capsule by mouth daily.    . ondansetron (ZOFRAN) 4 MG tablet TAKE ONE TABLET EVERY EIGHT HOURS AS NEEDED FOR NAUSEA AND VOMITING -TO PREVENT DIARRHEA MAY USE 2 TABLETS 60 tablet 5  . Probiotic Product (PRO-BIOTIC BLEND PO) Take 1 tablet by mouth daily.    Marland Kitchen triamcinolone cream (KENALOG) 0.1 % Apply 1 application topically 2 (two) times  daily. 45 g 0  . zolpidem (AMBIEN) 10 MG tablet TAKE 1/2 TO 1 TABLET AT BEDTIME 90 tablet 0  . albuterol (PROVENTIL HFA;VENTOLIN HFA) 108 (90 Base) MCG/ACT inhaler Inhale 2 puffs into the lungs as needed. 1 Inhaler 1  . Fluticasone-Salmeterol (ADVAIR DISKUS) 250-50 MCG/DOSE AEPB Inhale 1 puff into the lungs 2 (two) times daily. 60 each 2   Facility-Administered Medications Prior to Visit  Medication Dose Route Frequency Provider Last Rate Last Admin  . 0.9 %  sodium chloride infusion  500 mL Intravenous Once Gatha Mayer, MD      . 0.9 %  sodium chloride infusion  500 mL Intravenous Once Gatha Mayer, MD      . albuterol (PROVENTIL) (5 MG/ML) 0.5% nebulizer solution 2.5 mg  2.5 mg Nebulization Once Rita Ohara, MD        Review of Systems  Constitutional: Negative for chills and fever.  Respiratory: Positive for shortness of breath and wheezing. Negative for cough.   Cardiovascular: Negative for leg swelling.  Gastrointestinal: Negative for blood in stool.  Genitourinary: Negative for hematuria.     Objective:   Vitals:   06/13/19 1613  BP: 116/70  Pulse: 64  Temp: (!) 97 F (36.1 C)  TempSrc: Temporal  SpO2: 94%  Weight: 177 lb 6.4 oz (80.5 kg)  Height: _0  (1.727 m)   94% on   RA BMI Readings from Last 3 Encounters:  06/13/19 26.97 kg/m  02/06/19 23.57 kg/m  01/01/19 24.02 kg/m   Wt Readings from Last 3 Encounters:  06/13/19 177 lb 6.4 oz (80.5 kg)  02/06/19 155 lb (70.3 kg)  01/01/19 158 lb (71.7 kg)    Physical Exam Vitals reviewed.  Constitutional:      Appearance: Normal  appearance. Shelby Randolph is not ill-appearing.  HENT:     Head: Normocephalic and atraumatic.     Nose:     Comments: Deferred due to masking requirement.    Mouth/Throat:     Comments: Deferred due to masking requirement. Eyes:     General: No scleral icterus. Cardiovascular:     Rate and Rhythm: Normal rate and regular rhythm.     Heart sounds: No murmur.  Pulmonary:      Comments: Comfortably on room air, no conversational dyspnea.  Clear to auscultation bilaterally. Abdominal:     General: There is no distension.     Palpations: Abdomen is soft.  Musculoskeletal:        General: No swelling or deformity.     Cervical back: Neck supple.  Lymphadenopathy:     Cervical: No cervical adenopathy.  Skin:    General: Skin is warm and dry.     Findings: No rash.  Neurological:     General: No focal deficit present.     Mental Status: Shelby Randolph is alert.     Motor: No weakness.     Coordination: Coordination normal.  Psychiatric:        Mood and Affect: Mood normal.        Behavior: Behavior normal.      CBC    Component Value Date/Time   WBC 4.6 01/24/2018 1020   WBC 5.6 04/02/2017 0849   RBC 4.16 01/24/2018 1020   RBC 4.22 04/02/2017 0849   HGB 13.0 01/24/2018 1020   HGB 12.1 05/23/2014 1335   HCT 38.5 01/24/2018 1020   HCT 36.8 05/23/2014 1335   PLT 164 01/24/2018 1020   MCV 93 01/24/2018 1020   MCV 90.4 05/23/2014 1335   MCH 31.3 01/24/2018 1020   MCH 30.6 04/02/2017 0849   MCHC 33.8 01/24/2018 1020   MCHC 34.2 04/02/2017 0849   RDW 13.3 01/24/2018 1020   RDW 13.7 05/23/2014 1335   LYMPHSABS 1.3 07/29/2015 0001   LYMPHSABS 1.4 05/23/2014 1335   MONOABS 0.3 07/29/2015 0001   MONOABS 0.4 05/23/2014 1335   EOSABS 0.0 07/29/2015 0001   EOSABS 0.0 05/23/2014 1335   BASOSABS 0.0 07/29/2015 0001   BASOSABS 0.0 05/23/2014 1335    CHEMISTRY No results for input(s): NA, K, CL, CO2, GLUCOSE, BUN, CREATININE, CALCIUM, MG, PHOS in the last 168 hours. CrCl cannot be calculated (Patient's most recent lab result is older than the maximum 21 days allowed.).   Chest Imaging- films reviewed: CT coronary 11/21/2018-lingular scarring.  Pulmonary Functions Testing Results: No flowsheet data found.  Spirometry 07/06/2017: FVC 2.3 (68%) FEV1 1.5 (60%) Ratio 67%   Echocardiogram 10/28/2018: LVEF 45 to 50%, no wall motion abnormalities.  Grade 1  diastolic dysfunction.  Mild MR and TR.      Assessment & Plan:     ICD-10-CM   1. Mild persistent allergic asthma  J45.30 Pulmonary function test    Fluticasone-Salmeterol (ADVAIR DISKUS) 250-50 MCG/DOSE AEPB    albuterol (VENTOLIN HFA) 108 (90 Base) MCG/ACT inhaler  2. Allergic rhinitis, unspecified seasonality, unspecified trigger  J30.9     Moderate persistent allergic asthma-uncontrolled on intermittent LABA-ICS -PFTs scheduled -After PFTs start Advair twice daily scheduled.  Rinse mouth after use. -Albuterol as needed -Up-to-date on flu and pneumonia vaccines.  Agree with receiving Covid vaccination.  Continue Covid precautions-social distancing, mask wearing, handwashing.  Shelby Randolph currently works from home. -Recommended continuing allergy medication. -Montelukast once daily -Recommended keeping pets out of the bedroom, and potentially  using an air filter in her bedroom. -Recommended cleaning while wearing a mask.  Allergic rhinosinusitis -Continue cetirizine -Start montelukast daily  RTC in 4 to 6 weeks.   Current Outpatient Medications:  .  acetaminophen (TYLENOL) 500 MG tablet, 1-2 tablets twice to three times daily as needed., Disp: 100 tablet, Rfl: 5 .  alosetron (LOTRONEX) 0.5 MG tablet, TAKE TWO TABLETS TWICE DAILY, Disp: 120 tablet, Rfl: 11 .  aspirin EC 81 MG tablet, Take 1 tablet (81 mg total) by mouth daily., Disp: 90 tablet, Rfl: 3 .  atenolol (TENORMIN) 25 MG tablet, TAKE ONE TABLET EACH DAY, Disp: 90 tablet, Rfl: 3 .  atorvastatin (LIPITOR) 20 MG tablet, Take 1 tablet (20 mg total) by mouth daily. (Patient taking differently: Take 20 mg by mouth 3 (three) times a week. ), Disp: 90 tablet, Rfl: 3 .  cetirizine (ZYRTEC) 10 MG tablet, Take 10 mg by mouth daily., Disp: , Rfl:  .  cholecalciferol (VITAMIN D3) 25 MCG (1000 UT) tablet, Take 1 tablet (1,000 Units total) by mouth daily., Disp: 90 tablet, Rfl: 3 .  hydrochlorothiazide (MICROZIDE) 12.5 MG capsule, TAKE ONE  CAPSULE EACH DAY, Disp: 90 capsule, Rfl: 3 .  metoCLOPramide (REGLAN) 5 MG tablet, TAKE ONE TABLET TWICE DAILY (Patient taking differently: as needed. ), Disp: 180 tablet, Rfl: 0 .  Multiple Vitamin (MULTIVITAMIN) capsule, Take 1 capsule by mouth daily., Disp: , Rfl:  .  ondansetron (ZOFRAN) 4 MG tablet, TAKE ONE TABLET EVERY EIGHT HOURS AS NEEDED FOR NAUSEA AND VOMITING -TO PREVENT DIARRHEA MAY USE 2 TABLETS, Disp: 60 tablet, Rfl: 5 .  Probiotic Product (PRO-BIOTIC BLEND PO), Take 1 tablet by mouth daily., Disp: , Rfl:  .  triamcinolone cream (KENALOG) 0.1 %, Apply 1 application topically 2 (two) times daily., Disp: 45 g, Rfl: 0 .  zolpidem (AMBIEN) 10 MG tablet, TAKE 1/2 TO 1 TABLET AT BEDTIME, Disp: 90 tablet, Rfl: 0 .  albuterol (VENTOLIN HFA) 108 (90 Base) MCG/ACT inhaler, Inhale 2 puffs into the lungs every 4 (four) hours as needed for wheezing or shortness of breath., Disp: 18 g, Rfl: 5 .  Fluticasone-Salmeterol (ADVAIR DISKUS) 250-50 MCG/DOSE AEPB, Inhale 1 puff into the lungs 2 (two) times daily., Disp: 60 each, Rfl: 11 .  montelukast (SINGULAIR) 10 MG tablet, Take 1 tablet (10 mg total) by mouth at bedtime., Disp: 30 tablet, Rfl: 11  Current Facility-Administered Medications:  .  0.9 %  sodium chloride infusion, 500 mL, Intravenous, Once, Gatha Mayer, MD .  0.9 %  sodium chloride infusion, 500 mL, Intravenous, Once, Gatha Mayer, MD .  albuterol (PROVENTIL) (5 MG/ML) 0.5% nebulizer solution 2.5 mg, 2.5 mg, Nebulization, Once, Rita Ohara, MD    Julian Hy, DO [facility] 06/13/2019 6:19 PM

## 2019-06-14 LAB — HM MAMMOGRAPHY

## 2019-06-15 ENCOUNTER — Ambulatory Visit: Payer: PPO | Attending: Internal Medicine

## 2019-06-15 DIAGNOSIS — Z23 Encounter for immunization: Secondary | ICD-10-CM

## 2019-06-15 NOTE — Progress Notes (Signed)
   Covid-19 Vaccination Clinic  Name:  HAYLI TUITT    MRN: [id number] DOB: [birth year]  06/15/2019  Ms. Graper was observed post Covid-19 immunization for 15 minutes without incidence. She was provided with Vaccine Information Sheet and instruction to access the V-Safe system.   Ms. Becklund was instructed to call 911 with any severe reactions post vaccine: Marland Kitchen Difficulty breathing  . Swelling of your face and throat  . A fast heartbeat  . A bad rash all over your body  . Dizziness and weakness    Immunizations Administered    Name Date Dose VIS Date Route   Pfizer COVID-19 Vaccine 06/15/2019  2:22 PM 0.3 mL 04/21/2019 Intramuscular   Manufacturer: [facility]   Lot: [id number]   [place]: [id number]

## 2019-06-17 ENCOUNTER — Ambulatory Visit: Payer: PPO

## 2019-06-23 ENCOUNTER — Other Ambulatory Visit (HOSPITAL_COMMUNITY)
Admission: RE | Admit: 2019-06-23 | Discharge: 2019-06-23 | Disposition: A | Discharge status: Home / Self Care | Payer: PPO | Source: Ambulatory Visit | Attending: Critical Care Medicine | Admitting: Critical Care Medicine

## 2019-06-23 DIAGNOSIS — Z20822 Contact with and (suspected) exposure to covid-19: Secondary | ICD-10-CM | POA: Insufficient documentation

## 2019-06-23 DIAGNOSIS — Z01812 Encounter for preprocedural laboratory examination: Secondary | ICD-10-CM | POA: Insufficient documentation

## 2019-06-23 LAB — SARS CORONAVIRUS 2 (TAT 6-24 HRS): SARS Coronavirus 2: NEGATIVE

## 2019-06-26 ENCOUNTER — Other Ambulatory Visit: Payer: Self-pay

## 2019-06-26 ENCOUNTER — Ambulatory Visit (INDEPENDENT_AMBULATORY_CARE_PROVIDER_SITE_OTHER): Payer: PPO | Admitting: Critical Care Medicine

## 2019-06-26 ENCOUNTER — Other Ambulatory Visit: Payer: Self-pay | Admitting: Medical

## 2019-06-26 DIAGNOSIS — J4530 Mild persistent asthma, uncomplicated: Secondary | ICD-10-CM

## 2019-06-26 LAB — PULMONARY FUNCTION TEST
DL/VA % pred: 110 %
DL/VA: 4.48 ml/min/mmHg/L
DLCO unc % pred: 96 %
DLCO unc: 20.53 ml/min/mmHg
FEF 25-75 Post: 1.67 L/sec
FEF 25-75 Pre: 0.79 L/sec
FEF2575-%Change-Post: 112 %
FEF2575-%Pred-Post: 79 %
FEF2575-%Pred-Pre: 37 %
FEV1-%Change-Post: 26 %
FEV1-%Pred-Post: 69 %
FEV1-%Pred-Pre: 55 %
FEV1-Post: 1.77 L
FEV1-Pre: 1.4 L
FEV1FVC-%Change-Post: 12 %
FEV1FVC-%Pred-Pre: 84 %
FEV6-%Change-Post: 13 %
FEV6-%Pred-Post: 75 %
FEV6-%Pred-Pre: 66 %
FEV6-Post: 2.42 L
FEV6-Pre: 2.13 L
FEV6FVC-%Change-Post: 0 %
FEV6FVC-%Pred-Post: 102 %
FEV6FVC-%Pred-Pre: 102 %
FVC-%Change-Post: 13 %
FVC-%Pred-Post: 73 %
FVC-%Pred-Pre: 65 %
FVC-Post: 2.46 L
FVC-Pre: 2.18 L
Post FEV1/FVC ratio: 72 %
Post FEV6/FVC ratio: 98 %
Pre FEV1/FVC ratio: 64 %
Pre FEV6/FVC Ratio: 98 %
RV % pred: 109 %
RV: 2.5 L
TLC % pred: 89 %
TLC: 4.86 L

## 2019-06-26 NOTE — Progress Notes (Signed)
PFT done today. 

## 2019-07-04 DIAGNOSIS — M25562 Pain in left knee: Secondary | ICD-10-CM | POA: Diagnosis not present

## 2019-07-10 ENCOUNTER — Ambulatory Visit (INDEPENDENT_AMBULATORY_CARE_PROVIDER_SITE_OTHER): Payer: PPO | Admitting: Medical

## 2019-07-10 ENCOUNTER — Other Ambulatory Visit: Payer: Self-pay

## 2019-07-10 ENCOUNTER — Ambulatory Visit: Payer: PPO | Attending: Internal Medicine

## 2019-07-10 ENCOUNTER — Encounter: Payer: Self-pay | Admitting: Medical

## 2019-07-10 VITALS — BP 102/70 | HR 66 | Temp 97.7°F | Ht 68.0 in | Wt 173.0 lb

## 2019-07-10 DIAGNOSIS — E2839 Other primary ovarian failure: Secondary | ICD-10-CM

## 2019-07-10 DIAGNOSIS — J4520 Mild intermittent asthma, uncomplicated: Secondary | ICD-10-CM | POA: Diagnosis not present

## 2019-07-10 DIAGNOSIS — F411 Generalized anxiety disorder: Secondary | ICD-10-CM

## 2019-07-10 DIAGNOSIS — E785 Hyperlipidemia, unspecified: Secondary | ICD-10-CM

## 2019-07-10 DIAGNOSIS — R110 Nausea: Secondary | ICD-10-CM

## 2019-07-10 DIAGNOSIS — Z7185 Encounter for immunization safety counseling: Secondary | ICD-10-CM

## 2019-07-10 DIAGNOSIS — Z780 Asymptomatic menopausal state: Secondary | ICD-10-CM

## 2019-07-10 DIAGNOSIS — I8393 Asymptomatic varicose veins of bilateral lower extremities: Secondary | ICD-10-CM

## 2019-07-10 DIAGNOSIS — Z0000 Encounter for general adult medical examination without abnormal findings: Secondary | ICD-10-CM | POA: Diagnosis not present

## 2019-07-10 DIAGNOSIS — E559 Vitamin D deficiency, unspecified: Secondary | ICD-10-CM | POA: Diagnosis not present

## 2019-07-10 DIAGNOSIS — I700 Atherosclerosis of aorta: Secondary | ICD-10-CM

## 2019-07-10 DIAGNOSIS — G8929 Other chronic pain: Secondary | ICD-10-CM

## 2019-07-10 DIAGNOSIS — J309 Allergic rhinitis, unspecified: Secondary | ICD-10-CM | POA: Diagnosis not present

## 2019-07-10 DIAGNOSIS — Z1509 Genetic susceptibility to other malignant neoplasm: Secondary | ICD-10-CM

## 2019-07-10 DIAGNOSIS — K580 Irritable bowel syndrome with diarrhea: Secondary | ICD-10-CM

## 2019-07-10 DIAGNOSIS — Z7189 Other specified counseling: Secondary | ICD-10-CM

## 2019-07-10 DIAGNOSIS — I10 Essential (primary) hypertension: Secondary | ICD-10-CM

## 2019-07-10 DIAGNOSIS — M25562 Pain in left knee: Secondary | ICD-10-CM

## 2019-07-10 DIAGNOSIS — Z86010 Personal history of colonic polyps: Secondary | ICD-10-CM

## 2019-07-10 DIAGNOSIS — M8580 Other specified disorders of bone density and structure, unspecified site: Secondary | ICD-10-CM | POA: Diagnosis not present

## 2019-07-10 DIAGNOSIS — K6289 Other specified diseases of anus and rectum: Secondary | ICD-10-CM

## 2019-07-10 DIAGNOSIS — Z566 Other physical and mental strain related to work: Secondary | ICD-10-CM | POA: Insufficient documentation

## 2019-07-10 DIAGNOSIS — G4700 Insomnia, unspecified: Secondary | ICD-10-CM

## 2019-07-10 DIAGNOSIS — Z23 Encounter for immunization: Secondary | ICD-10-CM

## 2019-07-10 NOTE — Progress Notes (Signed)
   Covid-19 Vaccination Clinic  Name:  Shelby Randolph    MRN: [id number] DOB: May 23, 1950  07/10/2019  Shelby Randolph was observed post Covid-19 immunization for 30 minutes based on pre-vaccination screening without incidence. She was provided with Vaccine Information Sheet and instruction to access the V-Safe system.   Shelby Randolph was instructed to call 911 with any severe reactions post vaccine: Marland Kitchen Difficulty breathing  . Swelling of your face and throat  . A fast heartbeat  . A bad rash all over your body  . Dizziness and weakness    Immunizations Administered    Name Date Dose VIS Date Route   Pfizer COVID-19 Vaccine 07/10/2019  4:52 PM 0.3 mL 04/21/2019 Intramuscular   Manufacturer: [place]   Lot: [id number]   [place]: [id number]

## 2019-07-10 NOTE — Progress Notes (Signed)
done

## 2019-07-10 NOTE — Patient Instructions (Addendum)
It was a pleasure to see you as always  Cancer screening  You are up-to-date on colonoscopy  We will try and get a copy of your most recent mammogram from gynecology  Continue to see dermatology regularly for skin surveillance   Vaccines:  Get your second Covid vaccine today as planned  Get your flu shot yearly  You are up-to-date on all other vaccines   See your dentist and eye doctor regularly See your gynecologist yearly     Other signifinicant issues reviewed and/or discusssed today: Hypertension-continue your current medication   I reviewed your echocardiogram from 2020.  Cardiology plans to recheck this again in 1 year.  You have a little decreased ejection fraction.    Atherosclerosis of abdominal aorta and coronary artery disease, elevated cholesterol -continue statin and aspirin.   Eat a low-cholesterol diet  Recommendations for improving lipids:  Foods TO AVOID or limit - fried foods, high sugar foods, white bread, enriched flour, fast food, red meat, large amounts of cheese, processed foods such as little debbie cakes, cookies, pies, donuts, for example  Foods TO INCLUDE in the diet - whole grains such as whole grain pasta, whole grain bread, barley, steel cut oatmeal (not instant oatmeal), avocado, fish, green leafy vegetables, nuts, increased fiber in diet, and using olive oil in small amounts for cooking or as salad dressing vinaigrette.      Osteopenia- get both aerobic and weightbearing exercise, changed to vitamin D supplementation 1000 units daily.   Avoid falls  Asthma-i reviewed your recent pulmonology notes, PFT.  Singulair was added recently to your regimen  Vitamin D deficiency-recommended vitamin D 1000 units daily.    IBS-managed by gastroenterology  Decreased rectal tone on recent colonoscopy 2020.  You are currently seeing physical therapy for this per gastroenterology  Left knee pain, torn meniscus--undergoing physical therapy per Dr.  Maureen Ralphs    Osteopenia  Osteopenia is a loss of thickness (density) inside of the bones. Another name for osteopenia is low bone mass. Mild osteopenia is a normal part of aging. It is not a disease, and it does not cause symptoms. However, if you have osteopenia and continue to lose bone mass, you could develop a condition that causes the bones to become thin and break more easily (osteoporosis). You may also lose some height, have back pain, and have a stooped posture. Although osteopenia is not a disease, making changes to your lifestyle and diet can help to prevent osteopenia from developing into osteoporosis. What are the causes? Osteopenia is caused by loss of calcium in the bones.  Bones are constantly changing. Old bone cells are continually being replaced with new bone cells. This process builds new bone. The mineral calcium is needed to build new bone and maintain bone density. Bone density is usually highest around age 19. After that, most people's bodies cannot replace all the bone they have lost with new bone. What increases the risk? You are more likely to develop this condition if:  You are older than age 36.  You are a woman who went through menopause early.  You have a long illness that keeps you in bed.  You do not get enough exercise.  You lack certain nutrients (malnutrition).  You have an overactive thyroid gland (hyperthyroidism).  You smoke.  You drink a lot of alcohol.  You are taking medicines that weaken the bones, such as steroids. What are the signs or symptoms? This condition does not cause any symptoms. You may have  a slightly higher risk for bone breaks (fractures), so getting fractures more easily than normal may be an indication of osteopenia. How is this diagnosed? Your health care provider can diagnose this condition with a special type of X-ray exam that measures bone density (dual-energy X-ray absorptiometry, DEXA). This test can measure bone  density in your hips, spine, and wrists. Osteopenia has no symptoms, so this condition is usually diagnosed after a routine bone density screening test is done for osteoporosis. This routine screening is usually done for:  Women who are age 23 or older.  Men who are age 67 or older. If you have risk factors for osteopenia, you may have the screening test at an earlier age. How is this treated? Making dietary and lifestyle changes can lower your risk for osteoporosis. If you have severe osteopenia that is close to becoming osteoporosis, your health care provider may prescribe medicines and dietary supplements such as calcium and vitamin D. These supplements help to rebuild bone density. Follow these instructions at home:   Take over-the-counter and prescription medicines only as told by your health care provider. These include vitamins and supplements.  Eat a diet that is high in calcium and vitamin D. ? Calcium is found in dairy products, beans, salmon, and leafy green vegetables like spinach and broccoli. ? Look for foods that have vitamin D and calcium added to them (fortified foods), such as orange juice, cereal, and bread.  Do 30 or more minutes of a weight-bearing exercise every day, such as walking, jogging, or playing a sport. These types of exercises strengthen the bones.  Take precautions at home to lower your risk of falling, such as: ? Keeping rooms well-lit and free of clutter, such as cords. ? Installing safety rails on stairs. ? Using rubber mats in the bathroom or other areas that are often wet or slippery.  Do not use any products that contain nicotine or tobacco, such as cigarettes and e-cigarettes. If you need help quitting, ask your health care provider.  Avoid alcohol or limit alcohol intake to no more than 1 drink a day for nonpregnant women and 2 drinks a day for men. One drink equals 12 oz of beer, 5 oz of wine, or 1 oz of hard liquor.  Keep all follow-up visits  as told by your health care provider. This is important. Contact a health care provider if:  You have not had a bone density screening for osteoporosis and you are: ? A woman, age 38 or older. ? A man, age 20 or older.  You are a postmenopausal woman who has not had a bone density screening for osteoporosis.  You are older than age 36 and you want to know if you should have bone density screening for osteoporosis. Summary  Osteopenia is a loss of thickness (density) inside of the bones. Another name for osteopenia is low bone mass.  Osteopenia is not a disease, but it may increase your risk for a condition that causes the bones to become thin and break more easily (osteoporosis).  You may be at risk for osteopenia if you are older than age 56 or if you are a woman who went through early menopause.  Osteopenia does not cause any symptoms, but it can be diagnosed with a bone density screening test.  Dietary and lifestyle changes are the first treatment for osteopenia. These may lower your risk for osteoporosis. This information is not intended to replace advice given to you by your health care  provider. Make sure you discuss any questions you have with your health care provider. Document Revised: 04/09/2017 Document Reviewed: 02/03/2017 Elsevier Patient Education  2020 [employer].

## 2019-07-10 NOTE — Progress Notes (Signed)
Subjective:    Shelby Randolph is a 69 y.o. female who presents for Preventative Services visit and chronic medical problems/med check visit.    Primary Care Provider Calisha Tindel, Camelia Eng, PA-C here for primary care  Current Health Care Team:  Dentist, Dr. Mariel Sleet  Eye doctor, Dr. Posey Pronto   Dr. Silvano Rusk, GI  Dr. Virgina Jock and Binnie Kand NP at [facility]  Dr. Renda Rolls, dermatology  Dr. Radene Knee, gynecology  Dr. Hector Shade, orthopedics    Medical Services you may have received from other than [facility] providers in the past year (date may be approximate) Dr. Maureen Ralphs, ortho, dermatology, gyn  Exercise Current exercise habits: walks 4 days a week for 1-2 hours.   Nutrition/Diet Current diet: well balanced  Depression Screen Depression screen PHQ 2/9 07/10/2019  Decreased Interest 1  Down, Depressed, Hopeless 1  PHQ - 2 Score 2  Altered sleeping 0  Tired, decreased energy 1  Change in appetite 0  Feeling bad or failure about yourself  1  Trouble concentrating 1  Moving slowly or fidgety/restless -  Suicidal thoughts 0  PHQ-9 Score 5  Difficult doing work/chores Somewhat difficult    Activities of Daily Living Screen/Functional Status Survey Is the patient deaf or have difficulty hearing?: No Does the patient have difficulty seeing, even when wearing glasses/contacts?: No Does the patient have difficulty concentrating, remembering, or making decisions?: Yes Does the patient have difficulty walking or climbing stairs?: No Does the patient have difficulty dressing or bathing?: No Does the patient have difficulty doing errands alone such as visiting a doctor's office or shopping?: No  Can patient draw a clock face showing 3:15 oclock, yes  Fall Risk Screen Fall Risk  07/10/2019 07/07/2018 07/06/2017 06/25/2016 05/23/2015  Falls in the past year? 0 0 No Yes No  Number falls in past yr: - - - 1 -  Injury with Fall? - - - Yes -  Comment - - - rt hip  -  Risk  for fall due to : - - - Other (Comment) -  Follow up - Falls evaluation completed - - -    Gait Assessment: Normal gait observed - yes  Advanced directives Does patient have a [place]? Yes Does patient have a Living Will? Yes  Past Medical History:  Diagnosis Date  . Abnormal screening cardiac CT 11/2011   coronary calcium score 13, 24% percentile, mild plaque in prox LAD and ostial RCA  . Adenomatous colon polyp    Dr. Silvano Rusk  . Allergy   . Anemia   . Anxiety   . Arthritis   . Asthma   . Atherosclerosis of aorta ([place]) 03/2014   abnormal on ultrasound, repeat US within 5 years  . Cancer ([place])    Skin cancer squamus   . Depression   . Diverticulosis   . Eye injury    in college, some pertinent loss of vision in left eye  . Genital herpes   . GERD (gastroesophageal reflux disease)   . H/O echocardiogram 10/28/2018   LVEF 45-50%, impaired diastolic dysfunction, mild mitral and tricuspid regurgitation; Dr. Virgina Jock  . History of bone density study 09/2011   normal  . History of mammogram    Dr. Radene Knee  . History of melanoma    sees [facility], dermatology  . History of PFTs 01/20/2011   moderate airflow limitation, no significant response to bronchodilator  . Hyperlipidemia   . Hypertension 12/2011  . Insomnia   .  Migraines    Dr. Melton Alar, migraines  . MSH6-related Lynch syndrome (HNPCC5) 08/20/2017  . Nearsightedness    wears glasses  . Osteoporosis   . Post-operative nausea and vomiting   . Routine gynecological examination    Dr. Radene Knee  . Zika virus disease 06/2014    Past Surgical History:  Procedure Laterality Date  . COLONOSCOPY  2016   Dr. Silvano Rusk, 2014 Dr. Earlean Shawl  . COLONOSCOPY  01/2019   weak anal sphincter tone, moderate rectocele, polyp, scattered diverticula; Dr. Silvano Rusk  . ESOPHAGOGASTRODUODENOSCOPY  11/2017   normal, Dr. Silvano Rusk  . ROTATOR CUFF REPAIR Right 05/2010  . TONSILLECTOMY AND ADENOIDECTOMY      as a kid  . TUBAL LIGATION  1985  . VAGINAL HYSTERECTOMY  1998   total; abnormal peroids  . VARICOSE VEIN SURGERY     bilat    Social History   Socioeconomic History  . Marital status: Widowed    Spouse name: Not on file  . Number of children: 5  . Years of education: Not on file  . Highest education level: Not on file  Occupational History  . Occupation: compliance agent (RN)    Employer: [employer]    Comment: Nurse Practitioner  Tobacco Use  . Smoking status: Never Smoker  . Smokeless tobacco: Never Used  Substance and Sexual Activity  . Alcohol use: Yes    Alcohol/week: 2.0 standard drinks    Types: 2 Glasses of wine per week  . Drug use: No  . Sexual activity: Not on file    Comment: exercise - walks, [place], cardio at gym 3 x/wk, yoga, widowed   Other Topics Concern  . Not on file  Social History Narrative   Lives alone, has 5 children ([place], [place], [place]), has several grandchildren, daughter Junie Panning in [place] in the [employer], exercises 5 days per week - walking, kickboxing, yoga; [employer];  Works for [employer] in [employer]   Social Determinants of Health   Financial Resource Strain:   . Difficulty of Paying Living Expenses: Not on file  Food Insecurity:   . Worried About Charity fundraiser in the Last Year: Not on file  . Ran Out of Food in the Last Year: Not on file  Transportation Needs:   . Lack of Transportation (Medical): Not on file  . Lack of Transportation (Non-Medical): Not on file  Physical Activity:   . Days of Exercise per Week: Not on file  . Minutes of Exercise per Session: Not on file  Stress:   . Feeling of Stress : Not on file  Social Connections:   . Frequency of Communication with Friends and Family: Not on file  . Frequency of Social Gatherings with Friends and Family: Not on file  . Attends Religious Services: Not on file  . Active Member of Clubs or Organizations: Not on  file  . Attends Archivist Meetings: Not on file  . Marital Status: Not on file  Intimate Partner Violence:   . Fear of Current or Ex-Partner: Not on file  . Emotionally Abused: Not on file  . Physically Abused: Not on file  . Sexually Abused: Not on file    Family History  Problem Relation Age of Onset  . COPD Mother        died age 67yo.  emphysema  . Colon cancer Father 53        died age 78yo  .  Colon cancer Brother 59  . Breast cancer Maternal Aunt   . Colon cancer Paternal Uncle        2  . Breast cancer Daughter   . Colon cancer Paternal Grandmother 70       colon cancer   . Diabetes Neg Hx   . Heart disease Neg Hx   . Hypertension Neg Hx   . Stroke Neg Hx   . Rectal cancer Neg Hx   . Stomach cancer Neg Hx      Current Outpatient Medications:  .  acetaminophen (TYLENOL) 500 MG tablet, 1-2 tablets twice to three times daily as needed., Disp: 100 tablet, Rfl: 5 .  albuterol (VENTOLIN HFA) 108 (90 Base) MCG/ACT inhaler, Inhale 2 puffs into the lungs every 4 (four) hours as needed for wheezing or shortness of breath., Disp: 18 g, Rfl: 5 .  alosetron (LOTRONEX) 0.5 MG tablet, TAKE TWO TABLETS TWICE DAILY, Disp: 120 tablet, Rfl: 11 .  aspirin EC 81 MG tablet, Take 1 tablet (81 mg total) by mouth daily., Disp: 90 tablet, Rfl: 3 .  atenolol (TENORMIN) 25 MG tablet, TAKE ONE TABLET EACH DAY, Disp: 90 tablet, Rfl: 3 .  cetirizine (ZYRTEC) 10 MG tablet, Take 10 mg by mouth daily., Disp: , Rfl:  .  cholecalciferol (VITAMIN D3) 25 MCG (1000 UT) tablet, Take 1 tablet (1,000 Units total) by mouth daily., Disp: 90 tablet, Rfl: 3 .  Fluticasone-Salmeterol (ADVAIR DISKUS) 250-50 MCG/DOSE AEPB, Inhale 1 puff into the lungs 2 (two) times daily., Disp: 60 each, Rfl: 11 .  hydrochlorothiazide (MICROZIDE) 12.5 MG capsule, TAKE ONE CAPSULE EACH DAY, Disp: 90 capsule, Rfl: 3 .  metoCLOPramide (REGLAN) 5 MG tablet, TAKE ONE TABLET TWICE DAILY (Patient taking differently: as needed.  ), Disp: 180 tablet, Rfl: 0 .  montelukast (SINGULAIR) 10 MG tablet, Take 1 tablet (10 mg total) by mouth at bedtime., Disp: 30 tablet, Rfl: 11 .  Multiple Vitamin (MULTIVITAMIN) capsule, Take 1 capsule by mouth daily., Disp: , Rfl:  .  ondansetron (ZOFRAN) 4 MG tablet, TAKE ONE TABLET EVERY EIGHT HOURS AS NEEDED FOR NAUSEA AND VOMITING -TO PREVENT DIARRHEA MAY USE 2 TABLETS, Disp: 60 tablet, Rfl: 5 .  Probiotic Product (PRO-BIOTIC BLEND PO), Take 1 tablet by mouth daily., Disp: , Rfl:  .  triamcinolone cream (KENALOG) 0.1 %, Apply 1 application topically 2 (two) times daily., Disp: 45 g, Rfl: 0 .  zolpidem (AMBIEN) 10 MG tablet, TAKE 1/2 TO 1 TABLET AT BEDTIME, Disp: 90 tablet, Rfl: 0 .  atorvastatin (LIPITOR) 20 MG tablet, Take 1 tablet (20 mg total) by mouth daily. (Patient taking differently: Take 20 mg by mouth 3 (three) times a week. ), Disp: 90 tablet, Rfl: 3 .  nystatin-triamcinolone (MYCOLOG II) cream, APPLY TO CORNERS OF MOUTH 4 TIMES DAILY, Disp: , Rfl:   Current Facility-Administered Medications:  .  0.9 %  sodium chloride infusion, 500 mL, Intravenous, Once, Gatha Mayer, MD .  0.9 %  sodium chloride infusion, 500 mL, Intravenous, Once, Gatha Mayer, MD .  albuterol (PROVENTIL) (5 MG/ML) 0.5% nebulizer solution 2.5 mg, 2.5 mg, Nebulization, Once, Rita Ohara, MD  Allergies  Allergen Reactions  . Sulfonamide Derivatives Shortness Of Breath and Swelling  . Mucinex [Guaifenesin Er] Other (See Comments)    "Jittery and hypes me up"    History reviewed: allergies, current medications, past family history, past medical history, past social history, past surgical history and problem list  Other issues discussed: She is  having some stress issues at work.  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  She is seeing orthopedics for left knee pain chronic, left meniscus tear, will be  doing physical therapy for this.  Sees Dr. Maureen Ralphs  She is seeing physical therapy for decreased rectal tone for gastroenterology  Compliant with medications  She is deciding on what she wants to do with work versus retirement.  She plans to work another 3 years.  She would like to hike more and spend some time doing some other volunteer work.  Recently saw pulmonology, Singulair was added to help with asthma and allergies  No other new issues  Objective:      Biometrics BP 102/70   Pulse 66   Temp 97.7 F (36.5 C)   Ht 5' 8"  (1.727 m)   Wt 173 lb (78.5 kg)   SpO2 96%   BMI 26.30 kg/m   Cognitive Testing  Alert? Yes  Normal Appearance?Yes  Oriented to person? Yes  Place? Yes   Time? Yes  Recall of three objects?  Yes  Can perform simple calculations? Yes  Displays appropriate judgment?Yes  Can read the correct time from a watch face?Yes  General appearance: alert, no distress, WD/WN, white female  Nutritional Status: Inadequate calore intake? no Loss of muscle mass? no Loss of fat beneath skin? no Localized or general edema? no Diminished functional status? no  Other pertinent exam: Neck: supple, no lymphadenopathy, no thyromegaly, no masses, no bruits Heart: RRR, normal S1, S2, no murmurs Lungs: CTA bilaterally, no wheezes, rhonchi, or rales Abdomen: +bs, soft, non tender, non distended, no masses, no hepatomegaly, no splenomegaly Extremities: no edema, no cyanosis, no clubbing Pulses: 2+ symmetric, upper and lower extremities, normal cap refill Neurological: alert, oriented x 3, CN2-12 intact, strength normal upper extremities and lower extremities, sensation normal throughout, DTRs 2+ throughout, no cerebellar signs, gait normal Psychiatric: normal affect, behavior normal, pleasant  Skin scattered macules, SKs throughout back, several solar lentiges throughout    Assessment:   Encounter Diagnoses  Name Primary?  . Encounter for health maintenance  examination in adult Yes  . Medicare annual wellness visit, subsequent   . Essential hypertension   . Atherosclerosis of abdominal aorta ([place])   . Varicose veins of both lower extremities, unspecified whether complicated   . Mild intermittent asthma without complication   . Allergic rhinitis, unspecified seasonality, unspecified trigger   . Irritable bowel syndrome with diarrhea   . Osteopenia, unspecified location   . Dyslipidemia   . Generalized anxiety disorder   . Vitamin D deficiency   . Lynch syndrome   . Post-menopausal   . Estrogen deficiency   . Vaccine counseling   . Monoallelic mutation of MSH6 gene   . History of colonic polyps   . Chronic nausea   . Insomnia, unspecified type   . Chronic pain of left knee   . Decreased rectal sphincter tone      Plan:   A preventative services visit was completed today.  During the course of the visit today, we discussed and counseled about appropriate screening and preventive services.  A health risk assessment was established today that included a review of current medications, allergies, social history, family history, medical and preventative health history, biometrics, and preventative screenings to identify potential safety concerns or impairments.  A personalized plan was printed today for your records  and use.   Personalized health advice and education was given today to reduce health risks and promote self management and wellness.  Information regarding end of life planning was discussed today.  Conditions/risks identified: Left knee pain, skin surveillance per dermatology, decreased rectal tone  Cancer screening  You are up-to-date on colonoscopy  We will try and get a copy of your most recent mammogram from gynecology  Continue to see dermatology regularly for skin surveillance   Other signifying issues reviewed and/or discussed today: Hypertension-compliant with medication, blood pressure at goal, no issues.   Routine labs today.  Atherosclerosis of abdominal aorta and coronary artery disease-on statin, on aspirin, routine labs today.  I reviewed her CT calcium score done this past year, she was on the 24 percentile.  She had decreased LVEF on echocardiogram from this past year.  Cardiology plans to repeat this in 1 year per their recent note  Dyslipidemia-continue statin, labs today  Osteopenia-discussed importance of aerobic and weightbearing exercise, vitamin D supplementation, reducing risk of falls.  I reviewed her bone density scan on file from last year 2020  Asthma-reviewed her recent pulmonology notes, PFT.  She continues on Advair, continues albuterol as needed with exercise, Singulair was just recently added  Vitamin D deficiency-recommended vitamin D 1000 units daily.  She is currently using a lower over-the-counter dose  Insomnia-uses Ambien without any major issues.  Does fine on this.  This is a long-term issue for her.  IBS-managed by gastroenterology  Decreased rectal tone on recent colonoscopy 2020.  She is seeing physical therapy for this per gastroenterology  Left knee pain, torn meniscus--undergoing physical therapy per Dr. Maureen Ralphs   Recommendations:  I recommend a yearly ophthalmology/optometry visit for glaucoma screening and eye checkup  I recommended a yearly dental visit for hygiene and checkup  Advanced directives - discussed nature and purpose of Advanced Directives, encouraged them to complete them if they have not done so and/or encouraged them to get [place] a copy if they have done this already.   Referrals today: none  Immunizations: I recommended a yearly influenza vaccine, typically in September when the vaccine is usually available Up to date on all other vaccines and going for 2nd Covid vaccine today   Saran was seen today for medicare wellness.  Diagnoses and all orders for this visit:  Encounter for health maintenance examination in adult -      Comprehensive metabolic panel -     CBC with Differential/Platelet -     Lipid panel -     TSH -     T4, free  Medicare annual wellness visit, subsequent  Essential hypertension -     Comprehensive metabolic panel  Atherosclerosis of abdominal aorta (HCC) -     Lipid panel  Varicose veins of both lower extremities, unspecified whether complicated  Mild intermittent asthma without complication  Allergic rhinitis, unspecified seasonality, unspecified trigger  Irritable bowel syndrome with diarrhea  Osteopenia, unspecified location  Dyslipidemia -     Lipid panel  Generalized anxiety disorder  Vitamin D deficiency  Lynch syndrome  Post-menopausal  Estrogen deficiency  Vaccine counseling  Monoallelic mutation of MSH6 gene  History of colonic polyps  Chronic nausea  Insomnia, unspecified type  Chronic pain of left knee  Decreased rectal sphincter tone     Medicare Attestation A preventative services visit was completed today.  During the course of the visit the patient was educated and counseled about appropriate screening and preventive services.  A health  risk assessment was established with the patient that included a review of current medications, allergies, social history, family history, medical and preventative health history, biometrics, and preventative screenings to identify potential safety concerns or impairments.  A personalized plan was printed today for the patient's records and use.   Personalized health advice and education was given today to reduce health risks and promote self management and wellness.  Information regarding end of life planning was discussed today.  Dorothea Ogle, PA-C   07/10/2019

## 2019-07-11 ENCOUNTER — Other Ambulatory Visit: Payer: Self-pay | Admitting: Medical

## 2019-07-11 LAB — CBC WITH DIFFERENTIAL/PLATELET
Basophils Absolute: 0 10*3/uL (ref 0.0–0.2)
Basos: 0 %
EOS (ABSOLUTE): 0 10*3/uL (ref 0.0–0.4)
Eos: 0 %
Hematocrit: 38.9 % (ref 34.0–46.6)
Hemoglobin: 13.3 g/dL (ref 11.1–15.9)
Immature Grans (Abs): 0 10*3/uL (ref 0.0–0.1)
Immature Granulocytes: 0 %
Lymphocytes Absolute: 1.1 10*3/uL (ref 0.7–3.1)
Lymphs: 20 %
MCH: 31.5 pg (ref 26.6–33.0)
MCHC: 34.2 g/dL (ref 31.5–35.7)
MCV: 92 fL (ref 79–97)
Monocytes Absolute: 0.5 10*3/uL (ref 0.1–0.9)
Monocytes: 10 %
Neutrophils Absolute: 3.7 10*3/uL (ref 1.4–7.0)
Neutrophils: 70 %
Platelets: 196 10*3/uL (ref 150–450)
RBC: 4.22 x10E6/uL (ref 3.77–5.28)
RDW: 12.6 % (ref 11.7–15.4)
WBC: 5.3 10*3/uL (ref 3.4–10.8)

## 2019-07-11 LAB — TSH: TSH: 2.51 u[IU]/mL (ref 0.450–4.500)

## 2019-07-11 LAB — COMPREHENSIVE METABOLIC PANEL
ALT: 19 IU/L (ref 0–32)
AST: 30 IU/L (ref 0–40)
Albumin/Globulin Ratio: 2.6 — ABNORMAL HIGH (ref 1.2–2.2)
Albumin: 4.9 g/dL — ABNORMAL HIGH (ref 3.8–4.8)
Alkaline Phosphatase: 75 IU/L (ref 39–117)
BUN/Creatinine Ratio: 32 — ABNORMAL HIGH (ref 12–28)
BUN: 26 mg/dL (ref 8–27)
Bilirubin Total: 0.4 mg/dL (ref 0.0–1.2)
CO2: 21 mmol/L (ref 20–29)
Calcium: 9.8 mg/dL (ref 8.7–10.3)
Chloride: 106 mmol/L (ref 96–106)
Creatinine, Ser: 0.81 mg/dL (ref 0.57–1.00)
GFR calc Af Amer: 86 mL/min/{1.73_m2} (ref >59)
GFR calc non Af Amer: 74 mL/min/{1.73_m2} (ref >59)
Globulin, Total: 1.9 g/dL (ref 1.5–4.5)
Glucose: 109 mg/dL — ABNORMAL HIGH (ref 65–99)
Potassium: 4.5 mmol/L (ref 3.5–5.2)
Sodium: 143 mmol/L (ref 134–144)
Total Protein: 6.8 g/dL (ref 6.0–8.5)

## 2019-07-11 LAB — LIPID PANEL
Chol/HDL Ratio: 2.8 ratio (ref 0.0–4.4)
Cholesterol, Total: 225 mg/dL — ABNORMAL HIGH (ref 100–199)
HDL: 80 mg/dL (ref >39)
LDL Chol Calc (NIH): 119 mg/dL — ABNORMAL HIGH (ref 0–99)
Triglycerides: 153 mg/dL — ABNORMAL HIGH (ref 0–149)
VLDL Cholesterol Cal: 26 mg/dL (ref 5–40)

## 2019-07-11 LAB — T4, FREE: Free T4: 1.5 ng/dL (ref 0.82–1.77)

## 2019-07-11 MED ORDER — ASPIRIN EC 81 MG PO TBEC: 81.0000 mg | DELAYED_RELEASE_TABLET | Freq: Every day | ORAL | 3 refills | Status: DC

## 2019-07-11 MED ORDER — ZOLPIDEM TARTRATE 10 MG PO TABS: ORAL_TABLET | ORAL | 0 refills | Status: DC

## 2019-07-11 MED ORDER — VITAMIN D 25 MCG (1000 UNIT) PO TABS: 1000.0000 [IU] | ORAL_TABLET | Freq: Every day | ORAL | 3 refills | Status: DC

## 2019-07-11 MED ORDER — ATENOLOL 25 MG PO TABS: ORAL_TABLET | ORAL | 3 refills | Status: DC

## 2019-07-11 MED ORDER — ATORVASTATIN CALCIUM 20 MG PO TABS: 20.0000 mg | ORAL_TABLET | Freq: Every day | ORAL | 3 refills | Status: DC

## 2019-07-13 ENCOUNTER — Other Ambulatory Visit: Payer: Self-pay | Admitting: Medical

## 2019-07-14 DIAGNOSIS — M25562 Pain in left knee: Secondary | ICD-10-CM | POA: Diagnosis not present

## 2019-07-18 DIAGNOSIS — M25562 Pain in left knee: Secondary | ICD-10-CM | POA: Diagnosis not present

## 2019-07-24 ENCOUNTER — Telehealth: Payer: Self-pay | Admitting: Medical

## 2019-07-24 NOTE — Telephone Encounter (Signed)
Requested records received from [facility]

## 2019-07-25 ENCOUNTER — Ambulatory Visit: Payer: PPO | Admitting: Critical Care Medicine

## 2019-07-25 ENCOUNTER — Encounter: Payer: Self-pay | Admitting: Medical

## 2019-07-26 DIAGNOSIS — M25562 Pain in left knee: Secondary | ICD-10-CM | POA: Diagnosis not present

## 2019-07-28 DIAGNOSIS — M25562 Pain in left knee: Secondary | ICD-10-CM | POA: Diagnosis not present

## 2019-07-31 DIAGNOSIS — M25562 Pain in left knee: Secondary | ICD-10-CM | POA: Diagnosis not present

## 2019-08-02 DIAGNOSIS — M25562 Pain in left knee: Secondary | ICD-10-CM | POA: Diagnosis not present

## 2019-08-04 DIAGNOSIS — M25562 Pain in left knee: Secondary | ICD-10-CM | POA: Diagnosis not present

## 2019-08-07 DIAGNOSIS — M25562 Pain in left knee: Secondary | ICD-10-CM | POA: Diagnosis not present

## 2019-08-14 DIAGNOSIS — M25562 Pain in left knee: Secondary | ICD-10-CM | POA: Diagnosis not present

## 2019-08-21 DIAGNOSIS — M25562 Pain in left knee: Secondary | ICD-10-CM | POA: Diagnosis not present

## 2019-08-22 ENCOUNTER — Telehealth: Payer: Self-pay

## 2019-08-22 ENCOUNTER — Other Ambulatory Visit: Payer: Self-pay | Admitting: Medical

## 2019-08-22 NOTE — Telephone Encounter (Signed)
The pharmacists from [facility] called stating that the pt. Was requesting a refill on her acyclovir but she would like to change it to the 200 mg capsule the 400 mg tabs and to hard to swallow, it would have to be 2 capsules twice daily as needed to equal the 800 mg. Pt. Last apt. Was 07/10/19.

## 2019-08-22 NOTE — Telephone Encounter (Signed)
Does she want to have pharmacy price the liquid?  It does come in liquid as well for the 200mg ?

## 2019-08-23 ENCOUNTER — Other Ambulatory Visit: Payer: Self-pay | Admitting: Medical

## 2019-08-23 MED ORDER — ACYCLOVIR 200 MG PO CAPS: ORAL_CAPSULE | ORAL | 2 refills | Status: DC

## 2019-08-23 NOTE — Telephone Encounter (Signed)
Message sent to mychart

## 2019-08-23 NOTE — Telephone Encounter (Signed)
I sent the 200mg  capsules.   I hope this is tolerated better. Shelby Randolph

## 2019-08-23 NOTE — Telephone Encounter (Signed)
Patient stated she does not want liquid. She wants capsules.

## 2019-09-04 DIAGNOSIS — M25562 Pain in left knee: Secondary | ICD-10-CM | POA: Diagnosis not present

## 2019-09-05 DIAGNOSIS — M9901 Segmental and somatic dysfunction of cervical region: Secondary | ICD-10-CM | POA: Diagnosis not present

## 2019-09-05 DIAGNOSIS — M542 Cervicalgia: Secondary | ICD-10-CM | POA: Diagnosis not present

## 2019-09-11 DIAGNOSIS — M9901 Segmental and somatic dysfunction of cervical region: Secondary | ICD-10-CM | POA: Diagnosis not present

## 2019-09-11 DIAGNOSIS — M542 Cervicalgia: Secondary | ICD-10-CM | POA: Diagnosis not present

## 2019-09-18 DIAGNOSIS — M542 Cervicalgia: Secondary | ICD-10-CM | POA: Diagnosis not present

## 2019-09-18 DIAGNOSIS — M9901 Segmental and somatic dysfunction of cervical region: Secondary | ICD-10-CM | POA: Diagnosis not present

## 2019-09-21 DIAGNOSIS — M9901 Segmental and somatic dysfunction of cervical region: Secondary | ICD-10-CM | POA: Diagnosis not present

## 2019-09-21 DIAGNOSIS — M542 Cervicalgia: Secondary | ICD-10-CM | POA: Diagnosis not present

## 2019-10-23 DIAGNOSIS — D485 Neoplasm of uncertain behavior of skin: Secondary | ICD-10-CM | POA: Diagnosis not present

## 2019-10-23 DIAGNOSIS — L820 Inflamed seborrheic keratosis: Secondary | ICD-10-CM | POA: Diagnosis not present

## 2019-11-15 DIAGNOSIS — Z20822 Contact with and (suspected) exposure to covid-19: Secondary | ICD-10-CM | POA: Diagnosis not present

## 2019-12-13 ENCOUNTER — Other Ambulatory Visit: Payer: Self-pay

## 2019-12-13 ENCOUNTER — Ambulatory Visit: Payer: PPO

## 2019-12-13 ENCOUNTER — Other Ambulatory Visit: Payer: PPO

## 2019-12-13 DIAGNOSIS — R0600 Dyspnea, unspecified: Secondary | ICD-10-CM | POA: Diagnosis not present

## 2019-12-13 DIAGNOSIS — R0609 Other forms of dyspnea: Secondary | ICD-10-CM

## 2019-12-20 ENCOUNTER — Ambulatory Visit: Payer: PPO | Admitting: Cardiology

## 2019-12-22 ENCOUNTER — Encounter: Payer: Self-pay | Admitting: Cardiology

## 2019-12-22 ENCOUNTER — Other Ambulatory Visit: Payer: Self-pay

## 2019-12-22 ENCOUNTER — Ambulatory Visit: Payer: PPO | Admitting: Cardiology

## 2019-12-22 VITALS — BP 135/83 | HR 65 | Resp 15 | Ht 68.0 in | Wt 188.0 lb

## 2019-12-22 DIAGNOSIS — E785 Hyperlipidemia, unspecified: Secondary | ICD-10-CM | POA: Diagnosis not present

## 2019-12-22 DIAGNOSIS — I10 Essential (primary) hypertension: Secondary | ICD-10-CM

## 2019-12-22 DIAGNOSIS — I2510 Atherosclerotic heart disease of native coronary artery without angina pectoris: Secondary | ICD-10-CM

## 2019-12-22 DIAGNOSIS — R0609 Other forms of dyspnea: Secondary | ICD-10-CM | POA: Diagnosis not present

## 2019-12-22 DIAGNOSIS — R9431 Abnormal electrocardiogram [ECG] [EKG]: Secondary | ICD-10-CM

## 2019-12-22 DIAGNOSIS — Z712 Person consulting for explanation of examination or test findings: Secondary | ICD-10-CM | POA: Diagnosis not present

## 2019-12-22 DIAGNOSIS — I2584 Coronary atherosclerosis due to calcified coronary lesion: Secondary | ICD-10-CM

## 2019-12-22 NOTE — Progress Notes (Signed)
[name] Date of Birth: 1951-04-27 MRN: [id number] Primary Care Provider:Tysinger, Camelia Eng, PA-C Former Cardiology Providers: Shelby Lager, APRN, FNP-C Primary Cardiologist: Shelby Kras, DO, [facility] (established care 12/22/2019)  Date: 12/22/19 Last Visit: 12/21/2018  Chief Complaint  Patient presents with  . Follow-up    1 year  . Depressed LVEF  . Results    HPI  Shelby Randolph is a 69 y.o.  female who works as a Designer, jewellery at [employer] and presents to the office with a chief complaint of " 1 year for reduced LVEF review test results." Patient's past medical history and cardiovascular risk factors include: Hypertension, hyperlipidemia, aortic atherosclerosis, depression, GERD, Lynch syndrome, coronary artery calcification, mildly reduced LVEF, postmenopausal female, advanced age.  Patient was formally under the care of Shelby Randolph and is here to reestablish care and to review most recent echocardiogram results given her history of reduced LVEF.  Patient was noted to have mildly reduced LVEF in the past and underwent coronary CTA.  Coronary CTA noted coronary artery calcification and minimal CAD.  Patient was started on, therapy.  Reevaluation of symptoms.  Over the last 1 year patient has not been hospitalized for cardiovascular symptoms.  She denies any chest pain at rest or with effort related activities.  She continues to have effort related dyspnea which she is attributing to deconditioning and gaining weight since the COVID-19 pandemic.  Interestingly enough patient states that she used to walk up to 30 miles a week about 2 years ago and now approximates about 7 miles per week.  FUNCTIONAL STATUS: Walks one mile per day. About 2 years ago was walking 30 miles a week.    ALLERGIES: Allergies  Allergen Reactions  . Sulfonamide Derivatives Shortness Of Breath and Swelling  . Mucinex [Guaifenesin Er] Other (See Comments)    "Jittery and hypes me up"      MEDICATION LIST PRIOR TO VISIT: Current Outpatient Medications on File Prior to Visit  Medication Sig Dispense Refill  . acetaminophen (TYLENOL) 500 MG tablet 1-2 tablets twice to three times daily as needed. 100 tablet 5  . acyclovir (ZOVIRAX) 200 MG capsule 2 capsules 474m TID x 5 days for flare up 30 capsule 2  . albuterol (VENTOLIN HFA) 108 (90 Base) MCG/ACT inhaler Inhale 2 puffs into the lungs every 4 (four) hours as needed for wheezing or shortness of breath. 18 g 5  . alosetron (LOTRONEX) 0.5 MG tablet TAKE TWO TABLETS TWICE DAILY 120 tablet 11  . aspirin EC 81 MG tablet Take 1 tablet (81 mg total) by mouth daily. 90 tablet 3  . atenolol (TENORMIN) 25 MG tablet TAKE ONE TABLET EACH DAY 90 tablet 3  . atorvastatin (LIPITOR) 20 MG tablet Take 1 tablet (20 mg total) by mouth daily. 90 tablet 3  . cetirizine (ZYRTEC) 10 MG tablet Take 10 mg by mouth daily.    . cholecalciferol (VITAMIN D3) 25 MCG (1000 UNIT) tablet Take 1 tablet (1,000 Units total) by mouth daily. 90 tablet 3  . hydrochlorothiazide (MICROZIDE) 12.5 MG capsule TAKE ONE CAPSULE EACH DAY 90 capsule 3  . Multiple Vitamin (MULTIVITAMIN) capsule Take 1 capsule by mouth daily.    .Marland Kitchennystatin-triamcinolone (MYCOLOG II) cream APPLY TO CORNERS OF MOUTH 4 TIMES DAILY    . ondansetron (ZOFRAN) 4 MG tablet TAKE ONE TABLET EVERY EIGHT HOURS AS NEEDED FOR NAUSEA AND VOMITING -TO PREVENT DIARRHEA MAY USE 2 TABLETS 60 tablet 5  . Probiotic Product (PRO-BIOTIC BLEND PO)  Take 1 tablet by mouth daily.    Marland Kitchen triamcinolone cream (KENALOG) 0.1 % Apply 1 application topically 2 (two) times daily. 45 g 0  . zolpidem (AMBIEN) 10 MG tablet TAKE 1/2 TO 1 TABLET AT BEDTIME 90 tablet 1   Current Facility-Administered Medications on File Prior to Visit  Medication Dose Route Frequency Provider Last Rate Last Admin  . albuterol (PROVENTIL) (5 MG/ML) 0.5% nebulizer solution 2.5 mg  2.5 mg Nebulization Once Rita Ohara, MD        PAST MEDICAL  HISTORY: Past Medical History:  Diagnosis Date  . Abnormal screening cardiac CT 11/2011   coronary calcium score 13, 24% percentile, mild plaque in prox LAD and ostial RCA  . Adenomatous colon polyp    Dr. Silvano Rusk  . Allergy   . Anemia   . Anxiety   . Arthritis   . Asthma   . Atherosclerosis of aorta ([place]) 03/2014   abnormal on ultrasound, repeat US within 5 years  . Cancer ([place])    Skin cancer squamus   . Depression   . Diverticulosis   . Eye injury    in college, some pertinent loss of vision in left eye  . Genital herpes   . GERD (gastroesophageal reflux disease)   . H/O echocardiogram 10/28/2018   LVEF 45-50%, impaired diastolic dysfunction, mild mitral and tricuspid regurgitation; Dr. Virgina Randolph  . History of bone density study 09/2011   normal  . History of mammogram    Dr. Radene Randolph  . History of melanoma    sees [facility], dermatology  . History of PFTs 01/20/2011   moderate airflow limitation, no significant response to bronchodilator  . Hyperlipidemia   . Hypertension 12/2011  . Insomnia   . Migraines    Dr. Melton Alar, migraines  . MSH6-related Lynch syndrome (HNPCC5) 08/20/2017  . Nearsightedness    wears glasses  . Osteoporosis   . Post-operative nausea and vomiting   . Routine gynecological examination    Dr. Radene Randolph  . Zika virus disease 06/2014    PAST SURGICAL HISTORY: Past Surgical History:  Procedure Laterality Date  . COLONOSCOPY  2016   Dr. Silvano Rusk, 2014 Dr. Earlean Shawl  . COLONOSCOPY  01/2019   weak anal sphincter tone, moderate rectocele, polyp, scattered diverticula; Dr. Silvano Rusk  . ESOPHAGOGASTRODUODENOSCOPY  11/2017   normal, Dr. Silvano Rusk  . ROTATOR CUFF REPAIR Right 05/2010  . TONSILLECTOMY AND ADENOIDECTOMY     as a kid  . TUBAL LIGATION  1985  . VAGINAL HYSTERECTOMY  1998   total; abnormal peroids  . VARICOSE VEIN SURGERY     bilat    FAMILY HISTORY: The patient's family history includes Breast cancer in her daughter  and maternal aunt; COPD in her mother; Colon cancer in her paternal uncle; Colon cancer (age of onset: 41) in her brother; Colon cancer (age of onset: 45) in her father; Colon cancer (age of onset: 30) in her paternal grandmother.   SOCIAL HISTORY:  The patient  reports that she has never smoked. She has never used smokeless tobacco. She reports current alcohol use of about 2.0 standard drinks of alcohol per week. She reports that she does not use drugs.  Review of Systems  Constitutional: Negative for chills and fever.  HENT: Negative for hoarse voice and nosebleeds.   Eyes: Negative for discharge, double vision and pain.  Cardiovascular: Positive for dyspnea on exertion. Negative for chest pain, claudication, leg swelling, near-syncope, orthopnea, palpitations, paroxysmal nocturnal dyspnea and  syncope.  Respiratory: Negative for hemoptysis and shortness of breath.   Musculoskeletal: Negative for muscle cramps and myalgias.  Gastrointestinal: Negative for abdominal pain, constipation, diarrhea, hematemesis, hematochezia, melena, nausea and vomiting.  Neurological: Negative for dizziness and light-headedness.    PHYSICAL EXAM: Vitals with BMI 12/22/2019 07/10/2019 06/13/2019  Height _0  _1  _2   Weight 188 lbs 173 lbs 177 lbs 6 oz  BMI 28.59 10.31 28.11  Systolic 886 773 736  Diastolic 83 70 70  Pulse 65 66 64    CONSTITUTIONAL: Well-developed and well-nourished. No acute distress.  SKIN: Skin is warm and dry. No rash noted. No cyanosis. No pallor. No jaundice HEAD: Normocephalic and atraumatic.  EYES: No scleral icterus MOUTH/THROAT: Moist oral membranes.  NECK: No JVD present. No thyromegaly noted. No carotid bruits  LYMPHATIC: No visible cervical adenopathy.  CHEST Normal respiratory effort. No intercostal retractions  LUNGS: Clear to auscultation bilaterally.  No stridor. No wheezes. No rales.  CARDIOVASCULAR: Regular rate and rhythm, positive S1-S2, no murmurs rubs or gallops  appreciated. ABDOMINAL: No apparent ascites.  EXTREMITIES: No peripheral edema  HEMATOLOGIC: No significant bruising NEUROLOGIC: Oriented to person, place, and time. Nonfocal. Normal muscle tone.  PSYCHIATRIC: Normal mood and affect. Normal behavior. Cooperative  RADIOLOGY: Coronary CTA 11/16/2018:  1. Coronary calcium score of 13. This was 24th percentile for age and sex matched control. This is noted in the proximal and mid LAD.  2. Normal coronary origin with right dominance.  3. Minimal calcified plaque in the prox LAD and ostial RCA with no significant stenosis.  4. Aggressive medical therapy is recommended.  CARDIAC DATABASE: EKG: 12/22/2019: Sinus bradycardia, 59 bpm, LVH per voltage criteria, old anteroseptal infarct, poor R wave progression, T wave inversions in the inferior leads cannot entirely rule out ischemia.    Echocardiogram: 12/13/2019:  Left ventricle cavity is normal in size. Mild concentric hypertrophy of the left ventricle. Mildly global hypokinesis. LVEF 45-50%. Normal global wall motion. Doppler evidence of grade I (impaired) diastolic dysfunction, normal LAP.  Mild (Grade I) mitral regurgitation.  Mild tricuspid regurgitation.  No evidence of pulmonary hypertension.  Stress Testing:  None  Heart Catheterization: None  LABORATORY DATA: CBC Latest Ref Rng & Units 07/10/2019 01/24/2018 04/02/2017  WBC 3.4 - 10.8 x10E3/uL 5.3 4.6 5.6  Hemoglobin 11.1 - 15.9 g/dL 13.3 13.0 12.9  Hematocrit 34.0 - 46.6 % 38.9 38.5 37.7  Platelets 150 - 450 x10E3/uL 196 164 143(L)    CMP Latest Ref Rng & Units 07/10/2019 11/07/2018 07/07/2018  Glucose 65 - 99 mg/dL 109(H) 106(H) 92  BUN 8 - 27 mg/dL _3 Creatinine 0.57 - 1.00 mg/dL 0.81 0.90 0.90  Sodium 134 - 144 mmol/L 143 142 142  Potassium 3.5 - 5.2 mmol/L 4.5 4.3 4.2  Chloride 96 - 106 mmol/L 106 103 103  CO2 20 - 29 mmol/L _4 Calcium 8.7 - 10.3 mg/dL 9.8 9.8 9.5  Total Protein 6.0 - 8.5 g/dL 6.8 - 6.6    Total Bilirubin 0.0 - 1.2 mg/dL 0.4 - 0.5  Alkaline Phos 39 - 117 IU/L 75 - 52  AST 0 - 40 IU/L 30 - 24  ALT 0 - 32 IU/L 19 - 15    Lipid Panel     Component Value Date/Time   CHOL 225 (H) 07/10/2019 0855   TRIG 153 (H) 07/10/2019 0855   HDL 80 07/10/2019 0855   CHOLHDL 2.8 07/10/2019 0855   CHOLHDL 2.8 06/25/2016 6815  VLDL 18 06/25/2016 0953   LDLCALC 119 (H) 07/10/2019 0855   LDLDIRECT 140.1 03/01/2009 0000   LABVLDL 26 07/10/2019 0855    Lab Results  Component Value Date   HGBA1C 5.4 05/23/2015   No components found for: NTPROBNP Lab Results  Component Value Date   TSH 2.510 07/10/2019   TSH 1.700 07/07/2018   TSH 1.97 06/25/2016    Cardiac Panel (last 3 results) No results for input(s): CKTOTAL, CKMB, TROPONINIHS, RELINDX in the last 72 hours.  IMPRESSION:    ICD-10-CM   1. Dyspnea on exertion  R06.00 PCV MYOCARDIAL PERFUSION WO LEXISCAN  2. Essential hypertension  I10 EKG 12-Lead  3. Coronary atherosclerosis due to calcified coronary lesion of native artery  I25.10 PCV MYOCARDIAL PERFUSION WO LEXISCAN   I25.84   4. Abnormal EKG  R94.31 PCV MYOCARDIAL PERFUSION WO LEXISCAN  5. Mild hyperlipidemia  E78.5   6. Encounter to discuss test results  Z71.2      RECOMMENDATIONS: Shelby Randolph is a 69 y.o. female whose past medical history and cardiovascular risk factors include: Hypertension, hyperlipidemia, aortic atherosclerosis, depression, GERD, Lynch syndrome, coronary artery calcification, mildly reduced LVEF, postmenopausal female, advanced age.  Dyspnea on exertion:  Patient has been attributing her dyspnea on exertion to increase physical activity and overall deconditioning.  However, patient does have new EKG changes and repeat echocardiogram notes stable mildly reduced LVEF.  Since last office visit her lipid profile has also trended up and her statin medication further titrated as well.  Given her symptoms and risk factors recommend scheming evaluation  with exercise nuclear stress test.  Patient is asked to hold her atenolol for 2 days prior to the upcoming stress test.  Mildly reduced LVEF:  Discussed initiation of ACE inhibitors, ARB, or Arni with the patient at today's office visit.  Patient would like to hold off on additional medications at this time.  Educated on importance of improving her modifiable cardiovascular risk factors.  Also discussed transitioning from atenolol to Toprol-XL patient feels very comfortable using atenolol for now.  Continue aspirin and statin therapy.  Hypercholesterolemia:  Most recent lipid profile reviewed indices uptrending.  Currently managed by PCP.  Patient states that she has increased frequency of her statin medications.  Before she was taking statins 3 times a week secondary to myalgias.  She is also educated on considering nonstatin medications if needed.  Coronary artery calcification:  Continue aspirin and statin therapy.  Most recent echocardiogram results reviewed with patient at today's office visit.  We will schedule for an exercise nuclear stress test given the patient's symptoms of effort related dyspnea, EKG changes, and LVEF.  Nuclear stress test was recommended due to underlying LVH.  Benign essential hypertension: . Office blood pressure is within acceptable range..  . Medication reconciled.  . Low salt diet recommended. A diet that is rich in fruits, vegetables, legumes, and low-fat dairy products and low in snacks, sweets, and meats (such as the Dietary Approaches to Stop Hypertension [DASH] diet).   FINAL MEDICATION LIST END OF ENCOUNTER: No orders of the defined types were placed in this encounter.   Current Outpatient Medications:  .  acetaminophen (TYLENOL) 500 MG tablet, 1-2 tablets twice to three times daily as needed., Disp: 100 tablet, Rfl: 5 .  acyclovir (ZOVIRAX) 200 MG capsule, 2 capsules 478m TID x 5 days for flare up, Disp: 30 capsule, Rfl: 2 .  albuterol  (VENTOLIN HFA) 108 (90 Base) MCG/ACT inhaler, Inhale 2 puffs into the  lungs every 4 (four) hours as needed for wheezing or shortness of breath., Disp: 18 g, Rfl: 5 .  alosetron (LOTRONEX) 0.5 MG tablet, TAKE TWO TABLETS TWICE DAILY, Disp: 120 tablet, Rfl: 11 .  aspirin EC 81 MG tablet, Take 1 tablet (81 mg total) by mouth daily., Disp: 90 tablet, Rfl: 3 .  atenolol (TENORMIN) 25 MG tablet, TAKE ONE TABLET EACH DAY, Disp: 90 tablet, Rfl: 3 .  atorvastatin (LIPITOR) 20 MG tablet, Take 1 tablet (20 mg total) by mouth daily., Disp: 90 tablet, Rfl: 3 .  cetirizine (ZYRTEC) 10 MG tablet, Take 10 mg by mouth daily., Disp: , Rfl:  .  cholecalciferol (VITAMIN D3) 25 MCG (1000 UNIT) tablet, Take 1 tablet (1,000 Units total) by mouth daily., Disp: 90 tablet, Rfl: 3 .  hydrochlorothiazide (MICROZIDE) 12.5 MG capsule, TAKE ONE CAPSULE EACH DAY, Disp: 90 capsule, Rfl: 3 .  Multiple Vitamin (MULTIVITAMIN) capsule, Take 1 capsule by mouth daily., Disp: , Rfl:  .  nystatin-triamcinolone (MYCOLOG II) cream, APPLY TO CORNERS OF MOUTH 4 TIMES DAILY, Disp: , Rfl:  .  ondansetron (ZOFRAN) 4 MG tablet, TAKE ONE TABLET EVERY EIGHT HOURS AS NEEDED FOR NAUSEA AND VOMITING -TO PREVENT DIARRHEA MAY USE 2 TABLETS, Disp: 60 tablet, Rfl: 5 .  Probiotic Product (PRO-BIOTIC BLEND PO), Take 1 tablet by mouth daily., Disp: , Rfl:  .  triamcinolone cream (KENALOG) 0.1 %, Apply 1 application topically 2 (two) times daily., Disp: 45 g, Rfl: 0 .  zolpidem (AMBIEN) 10 MG tablet, TAKE 1/2 TO 1 TABLET AT BEDTIME, Disp: 90 tablet, Rfl: 1  Current Facility-Administered Medications:  .  albuterol (PROVENTIL) (5 MG/ML) 0.5% nebulizer solution 2.5 mg, 2.5 mg, Nebulization, Once, Rita Ohara, MD  Orders Placed This Encounter  Procedures  . PCV MYOCARDIAL PERFUSION WO LEXISCAN  . EKG 12-Lead   --Continue cardiac medications as reconciled in final medication list. --Return in about 1 year (around 12/21/2020) for follow up CAD, mild reduced  LVEF. Or sooner if needed. --Continue follow-up with your primary care physician regarding the management of your other chronic comorbid conditions.  Patient's questions and concerns were addressed to her satisfaction. She voices understanding of the instructions provided during this encounter.   This note was created using a voice recognition software as a result there may be grammatical errors inadvertently enclosed that do not reflect the nature of this encounter. Every attempt is made to correct such errors.  Total time spent: 40 minutes reviewing prior records, independently reviewing lab results and echocardiogram, coordination of care.   Shelby Randolph, [place], [facility]  Pager: [phone] Office: [phone]

## 2020-01-03 ENCOUNTER — Other Ambulatory Visit: Payer: PPO

## 2020-01-17 ENCOUNTER — Other Ambulatory Visit: Payer: Self-pay

## 2020-01-17 ENCOUNTER — Ambulatory Visit: Payer: PPO

## 2020-01-17 DIAGNOSIS — I2584 Coronary atherosclerosis due to calcified coronary lesion: Secondary | ICD-10-CM | POA: Diagnosis not present

## 2020-01-17 DIAGNOSIS — R0609 Other forms of dyspnea: Secondary | ICD-10-CM

## 2020-01-17 DIAGNOSIS — R9431 Abnormal electrocardiogram [ECG] [EKG]: Secondary | ICD-10-CM

## 2020-01-17 DIAGNOSIS — I2510 Atherosclerotic heart disease of native coronary artery without angina pectoris: Secondary | ICD-10-CM

## 2020-01-18 ENCOUNTER — Other Ambulatory Visit: Payer: Self-pay | Admitting: Medical

## 2020-01-18 MED ORDER — CLOTRIMAZOLE-BETAMETHASONE 1-0.05 % EX CREA: TOPICAL_CREAM | CUTANEOUS | 0 refills | Status: DC

## 2020-01-29 NOTE — Progress Notes (Signed)
Patient is aware of stress test results.

## 2020-02-06 DIAGNOSIS — D225 Melanocytic nevi of trunk: Secondary | ICD-10-CM | POA: Diagnosis not present

## 2020-02-06 DIAGNOSIS — L578 Other skin changes due to chronic exposure to nonionizing radiation: Secondary | ICD-10-CM | POA: Diagnosis not present

## 2020-02-06 DIAGNOSIS — L570 Actinic keratosis: Secondary | ICD-10-CM | POA: Diagnosis not present

## 2020-02-06 DIAGNOSIS — L820 Inflamed seborrheic keratosis: Secondary | ICD-10-CM | POA: Diagnosis not present

## 2020-02-06 DIAGNOSIS — Z85828 Personal history of other malignant neoplasm of skin: Secondary | ICD-10-CM | POA: Diagnosis not present

## 2020-02-06 DIAGNOSIS — L821 Other seborrheic keratosis: Secondary | ICD-10-CM | POA: Diagnosis not present

## 2020-02-06 DIAGNOSIS — L814 Other melanin hyperpigmentation: Secondary | ICD-10-CM | POA: Diagnosis not present

## 2020-02-06 DIAGNOSIS — D485 Neoplasm of uncertain behavior of skin: Secondary | ICD-10-CM | POA: Diagnosis not present

## 2020-02-14 DIAGNOSIS — L570 Actinic keratosis: Secondary | ICD-10-CM | POA: Diagnosis not present

## 2020-02-20 ENCOUNTER — Encounter: Payer: Self-pay | Admitting: Medical

## 2020-02-27 ENCOUNTER — Other Ambulatory Visit: Payer: Self-pay | Admitting: Internal Medicine

## 2020-02-27 NOTE — Telephone Encounter (Signed)
Please advise [name], thank you. 

## 2020-03-29 ENCOUNTER — Other Ambulatory Visit: Payer: Self-pay | Admitting: Medical

## 2020-03-29 NOTE — Telephone Encounter (Signed)
Pt. Requesting refill on Zolpidem and hctz last apt was 07/10/19 and next apt is 07/10/20.

## 2020-04-10 ENCOUNTER — Telehealth (INDEPENDENT_AMBULATORY_CARE_PROVIDER_SITE_OTHER): Payer: PPO | Admitting: Medical

## 2020-04-10 ENCOUNTER — Encounter: Payer: Self-pay | Admitting: Medical

## 2020-04-10 ENCOUNTER — Other Ambulatory Visit: Payer: Self-pay

## 2020-04-10 ENCOUNTER — Other Ambulatory Visit (INDEPENDENT_AMBULATORY_CARE_PROVIDER_SITE_OTHER): Payer: PPO

## 2020-04-10 ENCOUNTER — Telehealth: Payer: Self-pay

## 2020-04-10 VITALS — Ht 68.0 in | Wt 168.0 lb

## 2020-04-10 DIAGNOSIS — R059 Cough, unspecified: Secondary | ICD-10-CM

## 2020-04-10 DIAGNOSIS — I10 Essential (primary) hypertension: Secondary | ICD-10-CM | POA: Diagnosis not present

## 2020-04-10 DIAGNOSIS — R519 Headache, unspecified: Secondary | ICD-10-CM

## 2020-04-10 DIAGNOSIS — J4520 Mild intermittent asthma, uncomplicated: Secondary | ICD-10-CM | POA: Diagnosis not present

## 2020-04-10 LAB — POC COVID19 BINAXNOW: SARS Coronavirus 2 Ag: NEGATIVE

## 2020-04-10 MED ORDER — HYDROCODONE-HOMATROPINE 5-1.5 MG/5ML PO SYRP: 5.0000 mL | ORAL_SOLUTION | Freq: Three times a day (TID) | ORAL | 0 refills | Status: AC | PRN

## 2020-04-10 MED ORDER — HYDROCODONE-HOMATROPINE 5-1.5 MG PO TABS: 1.0000 | ORAL_TABLET | Freq: Two times a day (BID) | ORAL | 0 refills | Status: DC

## 2020-04-10 NOTE — Progress Notes (Addendum)
Subjective:     Patient ID: Shelby Randolph, female   DOB: [birth year], 69 y.o.   MRN: [id number]  This visit type was conducted due to national recommendations for restrictions regarding the COVID-19 Pandemic (e.g. social distancing) in an effort to limit this patient's exposure and mitigate transmission in our community.  Due to their co-morbid illnesses, this patient is at least at moderate risk for complications without adequate follow up.  This format is felt to be most appropriate for this patient at this time.    Documentation for virtual audio and video telecommunications through [place] encounter:  The patient was located at home. The provider was located in the office. The patient did consent to this visit and is aware of possible charges through their insurance for this visit.  The other persons participating in this telemedicine service were none. Time spent on call was 20 minutes and in review of previous records 20 minutes total.  This virtual service is not related to other E/M service within previous 7 days.   HPI Chief Complaint  Patient presents with  . Cough    headache, sob and runny nose   Virtual consult today for URI symptoms.  She notes being around her sick grandson last week.  He was sick. He tested negative for covid yesterday.  4 days ago she started feeling horrible. Had horrible headache and bad cough.  Still has smell and taste.  Been feeling yucky and coughing.  Feels horrible today.   Has chills. No sore throat, no ear pain.   Has nausea.  No vomiting, no diarrhea.  No fever.   Cough is nonproductive.  Has had covid vaccine and booster.  Using inhaler which helps a little.   Using some Tylenol.    Has preventative inhaler but hasn't been using this.  Has lots of cough.  No other aggravating or relieving factors. No other complaint.  Past Medical History:  Diagnosis Date  . Abnormal screening cardiac CT 11/2011   coronary calcium score 13, 24%  percentile, mild plaque in prox LAD and ostial RCA  . Adenomatous colon polyp    Dr. Silvano Rusk  . Allergy   . Anemia   . Anxiety   . Arthritis   . Asthma   . Atherosclerosis of aorta ([place]) 03/2014   abnormal on ultrasound, repeat US within 5 years  . Cancer ([place])    Skin cancer squamus   . Depression   . Diverticulosis   . Eye injury    in college, some pertinent loss of vision in left eye  . Genital herpes   . GERD (gastroesophageal reflux disease)   . H/O echocardiogram 10/28/2018   LVEF 45-50%, impaired diastolic dysfunction, mild mitral and tricuspid regurgitation; Dr. Virgina Jock  . History of bone density study 09/2011   normal  . History of mammogram    Dr. Radene Knee  . History of melanoma    sees [facility], dermatology  . History of PFTs 01/20/2011   moderate airflow limitation, no significant response to bronchodilator  . Hyperlipidemia   . Hypertension 12/2011  . Insomnia   . Migraines    Dr. Melton Alar, migraines  . MSH6-related Lynch syndrome (HNPCC5) 08/20/2017  . Nearsightedness    wears glasses  . Osteoporosis   . Post-operative nausea and vomiting   . Routine gynecological examination    Dr. Radene Knee  . Zika virus disease 06/2014    Review of Systems As in subjective    Objective:  Physical Exam Due to coronavirus pandemic stay at home measures, patient visit was virtual and they were not examined in person.   Ht _0  (1.727 m)   Wt 168 lb (76.2 kg)   BMI 25.54 kg/m       Assessment:     Encounter Diagnoses  Name Primary?  . Cough Yes  . Nonintractable headache, unspecified chronicity pattern, unspecified headache type   . Mild intermittent asthma without complication   . Essential hypertension        Plan:     We discussed limitations of virtual consult.  Continue to hydrate well, add back Advair for the next week at least, increase albuterol to at least 3 times a day, rest.  Cough medicine as below.  She will come up this afternoon in  our back parking lot for Covid screening.  Consider chest x-ray.  We discussed supportive care.  If positive Covid we would need to give other options for therapy  Shelby Randolph was seen today for cough.  Diagnoses and all orders for this visit:  Cough -     POC COVID-19 BinaxNow; Future -     Novel Coronavirus, NAA ([facility]); Future -     DG Chest 2 View; Future  Nonintractable headache, unspecified chronicity pattern, unspecified headache type -     POC COVID-19 BinaxNow; Future -     Novel Coronavirus, NAA ([facility]); Future -     DG Chest 2 View; Future  Mild intermittent asthma without complication  Essential hypertension  Other orders -     Discontinue: HYDROcodone-Homatropine 5-1.5 MG TABS; Take 1 tablet by mouth in the morning and at bedtime. For cough -     HYDROcodone-homatropine (HYCODAN) 5-1.5 MG/5ML syrup; Take 5 mLs by mouth every 8 (eight) hours as needed for up to 5 days.     F/u today here for covid testing

## 2020-04-10 NOTE — Telephone Encounter (Signed)
Pharmacy called and stated they only have Hydrocodone-Homatropine in liquid form. Patient has agreed to do liquid. Medication has to be sent to pharmacy again in liquid form.

## 2020-04-10 NOTE — Addendum Note (Signed)
Addended by: Carlena Hurl on: 04/10/2020 02:45 PM   Modules accepted: Orders

## 2020-04-12 ENCOUNTER — Other Ambulatory Visit: Payer: Self-pay

## 2020-04-12 ENCOUNTER — Ambulatory Visit (HOSPITAL_COMMUNITY)
Admission: RE | Admit: 2020-04-12 | Discharge: 2020-04-12 | Disposition: A | Discharge status: Home / Self Care | Payer: PPO | Source: Ambulatory Visit | Attending: Medical | Admitting: Medical

## 2020-04-12 ENCOUNTER — Other Ambulatory Visit: Payer: Self-pay | Admitting: Medical

## 2020-04-12 DIAGNOSIS — R059 Cough, unspecified: Secondary | ICD-10-CM | POA: Diagnosis not present

## 2020-04-12 DIAGNOSIS — R519 Headache, unspecified: Secondary | ICD-10-CM

## 2020-04-12 LAB — NOVEL CORONAVIRUS, NAA: SARS-CoV-2, NAA: NOT DETECTED

## 2020-04-12 LAB — SARS-COV-2, NAA 2 DAY TAT

## 2020-04-12 MED ORDER — PREDNISONE 10 MG PO TABS: ORAL_TABLET | ORAL | 0 refills | Status: DC

## 2020-04-17 ENCOUNTER — Other Ambulatory Visit: Payer: Self-pay | Admitting: Internal Medicine

## 2020-05-13 ENCOUNTER — Telehealth: Payer: Self-pay | Admitting: Internal Medicine

## 2020-05-13 ENCOUNTER — Other Ambulatory Visit: Payer: Self-pay | Admitting: Internal Medicine

## 2020-05-13 MED ORDER — ALOSETRON HCL 0.5 MG PO TABS
ORAL_TABLET | ORAL | 11 refills | Status: DC
Start: 2020-05-13 — End: 2020-07-03

## 2020-05-13 NOTE — Telephone Encounter (Signed)
The alosetron refill has been verbally given to Kim at [facility]. I called and told Erdine they are going to call her and go over shipment details.

## 2020-05-13 NOTE — Telephone Encounter (Signed)
Pt called to advise that [facility], a pharmacy that dispenses Lotronex will be faxing a request to rf pt's medication. Pt stated that medication is more affordable through [facility] than [facility] so she would like rf to be sent there. Pt wanted to make you aware that they only carry Lotronex. [facility] contact number is [phone].

## 2020-06-19 ENCOUNTER — Ambulatory Visit (AMBULATORY_SURGERY_CENTER): Payer: Self-pay | Admitting: *Deleted

## 2020-06-19 ENCOUNTER — Other Ambulatory Visit: Payer: Self-pay

## 2020-06-19 VITALS — Ht 68.0 in | Wt 172.0 lb

## 2020-06-19 DIAGNOSIS — Z1509 Genetic susceptibility to other malignant neoplasm: Secondary | ICD-10-CM

## 2020-06-19 DIAGNOSIS — Z86010 Personal history of colonic polyps: Secondary | ICD-10-CM

## 2020-06-19 NOTE — Progress Notes (Signed)
Patient is here in-person for PV. Patient denies any allergies to eggs or soy. Patient denies any problems with anesthesia/sedation. Patient denies any oxygen use at home. Patient denies taking any diet/weight loss medications or blood thinners. Patient is not being treated for MRSA or C-diff. Patient is aware of our care-partner policy and [id number] safety protocol.  COVID-19 vaccines completed x3, per patient.

## 2020-07-01 ENCOUNTER — Encounter: Payer: Self-pay | Admitting: Internal Medicine

## 2020-07-01 DIAGNOSIS — Z124 Encounter for screening for malignant neoplasm of cervix: Secondary | ICD-10-CM | POA: Diagnosis not present

## 2020-07-01 DIAGNOSIS — Z1231 Encounter for screening mammogram for malignant neoplasm of breast: Secondary | ICD-10-CM | POA: Diagnosis not present

## 2020-07-01 DIAGNOSIS — M8588 Other specified disorders of bone density and structure, other site: Secondary | ICD-10-CM | POA: Diagnosis not present

## 2020-07-01 DIAGNOSIS — Z6827 Body mass index (BMI) 27.0-27.9, adult: Secondary | ICD-10-CM | POA: Diagnosis not present

## 2020-07-01 DIAGNOSIS — N958 Other specified menopausal and perimenopausal disorders: Secondary | ICD-10-CM | POA: Diagnosis not present

## 2020-07-01 LAB — HM DEXA SCAN

## 2020-07-02 DIAGNOSIS — Z124 Encounter for screening for malignant neoplasm of cervix: Secondary | ICD-10-CM | POA: Diagnosis not present

## 2020-07-03 ENCOUNTER — Telehealth: Payer: Self-pay

## 2020-07-03 ENCOUNTER — Encounter: Payer: Self-pay | Admitting: Internal Medicine

## 2020-07-03 ENCOUNTER — Other Ambulatory Visit: Payer: Self-pay

## 2020-07-03 ENCOUNTER — Ambulatory Visit (AMBULATORY_SURGERY_CENTER): Payer: PPO | Admitting: Internal Medicine

## 2020-07-03 VITALS — BP 139/85 | HR 68 | Temp 98.9°F | Resp 17 | Ht 68.0 in | Wt 172.0 lb

## 2020-07-03 DIAGNOSIS — J45909 Unspecified asthma, uncomplicated: Secondary | ICD-10-CM | POA: Diagnosis not present

## 2020-07-03 DIAGNOSIS — K259 Gastric ulcer, unspecified as acute or chronic, without hemorrhage or perforation: Secondary | ICD-10-CM | POA: Diagnosis not present

## 2020-07-03 DIAGNOSIS — Z1509 Genetic susceptibility to other malignant neoplasm: Secondary | ICD-10-CM | POA: Diagnosis not present

## 2020-07-03 DIAGNOSIS — K319 Disease of stomach and duodenum, unspecified: Secondary | ICD-10-CM

## 2020-07-03 DIAGNOSIS — R159 Full incontinence of feces: Secondary | ICD-10-CM

## 2020-07-03 DIAGNOSIS — Z800 Family history of malignant neoplasm of digestive organs: Secondary | ICD-10-CM | POA: Diagnosis not present

## 2020-07-03 DIAGNOSIS — I10 Essential (primary) hypertension: Secondary | ICD-10-CM | POA: Diagnosis not present

## 2020-07-03 DIAGNOSIS — Z86010 Personal history of colonic polyps: Secondary | ICD-10-CM | POA: Diagnosis not present

## 2020-07-03 DIAGNOSIS — K3189 Other diseases of stomach and duodenum: Secondary | ICD-10-CM | POA: Diagnosis not present

## 2020-07-03 MED ORDER — ALOSETRON HCL 1 MG PO TABS: 1.0000 mg | ORAL_TABLET | Freq: Two times a day (BID) | ORAL | 11 refills | Status: DC

## 2020-07-03 MED ORDER — SODIUM CHLORIDE 0.9 % IV SOLN: 500.0000 mL | Freq: Once | INTRAVENOUS | Status: DC

## 2020-07-03 NOTE — Telephone Encounter (Signed)
-----   Message from Gatha Mayer, MD sent at 07/03/2020  9:44 AM EST ----- Regarding: pelvic PT Please refer back for another session(s) to refresh

## 2020-07-03 NOTE — Op Note (Signed)
[facility] Patient Name: Shelby Randolph Procedure Date: 07/03/2020 8:35 AM MRN: [id number] Endoscopist: Gatha Mayer , MD Age: 70 Referring MD:  Date of Birth: 10-04-1950 Gender: Female Account #: [id number] Procedure:                Upper GI endoscopy Indications:              Hereditary nonpolyposis colorectal cancer (Lynch                            Syndrome) Medicines:                Propofol per Anesthesia, Monitored Anesthesia Care Procedure:                Pre-Anesthesia Assessment:                           - Prior to the procedure, a History and Physical                            was performed, and patient medications and                            allergies were reviewed. The patient's tolerance of                            previous anesthesia was also reviewed. The risks                            and benefits of the procedure and the sedation                            options and risks were discussed with the patient.                            All questions were answered, and informed consent                            was obtained. Prior Anticoagulants: The patient has                            taken no previous anticoagulant or antiplatelet                            agents. ASA Grade Assessment: II - A patient with                            mild systemic disease. After reviewing the risks                            and benefits, the patient was deemed in                            satisfactory condition to undergo the procedure.  After obtaining informed consent, the endoscope was                            passed under direct vision. Throughout the                            procedure, the patient's blood pressure, pulse, and                            oxygen saturations were monitored continuously. The                            Endoscope was introduced through the mouth, and                            advanced to the second  part of duodenum. The upper                            GI endoscopy was accomplished without difficulty.                            The patient tolerated the procedure well. Scope In: Scope Out: Findings:                 A few small erosions with no stigmata of recent                            bleeding were found in the prepyloric region of the                            stomach. Biopsies were taken with a cold forceps                            for histology. Verification of patient                            identification for the specimen was done. Estimated                            blood loss was minimal.                           The gastroesophageal flap valve was visualized                            endoscopically and classified as Hill Grade II                            (fold present, opens with respiration).                           The exam was otherwise without abnormality.                           The cardia  and gastric fundus were normal on                            retroflexion. Complications:            No immediate complications. Estimated Blood Loss:     Estimated blood loss was minimal. Impression:               - Erosive gastropathy with no stigmata of recent                            bleeding. Biopsied.                           - Gastroesophageal flap valve classified as Hill                            Grade II (fold present, opens with respiration).                           - The examination was otherwise normal. Recommendation:           - Patient has a contact number available for                            emergencies. The signs and symptoms of potential                            delayed complications were discussed with the                            patient. Return to normal activities tomorrow.                            Written discharge instructions were provided to the                            patient.                           - Resume  previous diet.                           - Continue present medications.                           - Await pathology results.                           - See the other procedure note for documentation of                            additional recommendations.                           - Repeat upper endoscopy in 2 years for screening  purposes. Gatha Mayer, MD 07/03/2020 9:30:49 AM This report has been signed electronically.

## 2020-07-03 NOTE — Patient Instructions (Addendum)
No colon polyps - recheck colon in 1 year approximately.  Some small stomach erosions (tiny ulcers). Biopsies taken. Recheck 2 years.  Nothing that looks like cancer.  I sent a new alosetron rx to pharmacy as discussed.  We will refer back to pelvic PT.  I appreciate the opportunity to care for you. Gatha Mayer, MD, FACG  YOU HAD AN ENDOSCOPIC PROCEDURE TODAY AT [facility]:   Refer to the procedure report that was given to you for any specific questions about what was found during the examination.  If the procedure report does not answer your questions, please call your gastroenterologist to clarify.  If you requested that your care partner not be given the details of your procedure findings, then the procedure report has been included in a sealed envelope for you to review at your convenience later.  YOU SHOULD EXPECT: Some feelings of bloating in the abdomen. Passage of more gas than usual.  Walking can help get rid of the air that was put into your GI tract during the procedure and reduce the bloating. If you had a lower endoscopy (such as a colonoscopy or flexible sigmoidoscopy) you may notice spotting of blood in your stool or on the toilet paper. If you underwent a bowel prep for your procedure, you may not have a normal bowel movement for a few days.  Please Note:  You might notice some irritation and congestion in your nose or some drainage.  This is from the oxygen used during your procedure.  There is no need for concern and it should clear up in a day or so.  SYMPTOMS TO REPORT IMMEDIATELY:   Following lower endoscopy (colonoscopy or flexible sigmoidoscopy):  Excessive amounts of blood in the stool  Significant tenderness or worsening of abdominal pains  Swelling of the abdomen that is new, acute  Fever of 100F or higher   Following upper endoscopy (EGD)  Vomiting of blood or coffee ground material  New chest pain or pain under the shoulder  blades  Painful or persistently difficult swallowing  New shortness of breath  Fever of 100F or higher  Black, tarry-looking stools  For urgent or emergent issues, a gastroenterologist can be reached at any hour by calling [phone]. Do not use MyChart messaging for urgent concerns.    DIET:  We do recommend a small meal at first, but then you may proceed to your regular diet.  Drink plenty of fluids but you should avoid alcoholic beverages for 24 hours.  ACTIVITY:  You should plan to take it easy for the rest of today and you should NOT DRIVE or use heavy machinery until tomorrow (because of the sedation medicines used during the test).    FOLLOW UP: Our staff will call the number listed on your records 48-72 hours following your procedure to check on you and address any questions or concerns that you may have regarding the information given to you following your procedure. If we do not reach you, we will leave a message.  We will attempt to reach you two times.  During this call, we will ask if you have developed any symptoms of COVID 19. If you develop any symptoms (ie: fever, flu-like symptoms, shortness of breath, cough etc.) before then, please call [phone].  If you test positive for Covid 19 in the 2 weeks post procedure, please call and report this information to [place].    If any biopsies were taken you will be contacted  by phone or by letter within the next 1-3 weeks.  Please call us at [phone] if you have not heard about the biopsies in 3 weeks.    SIGNATURES/CONFIDENTIALITY: You and/or your care partner have signed paperwork which will be entered into your electronic medical record.  These signatures attest to the fact that that the information above on your After Visit Summary has been reviewed and is understood.  Full responsibility of the confidentiality of this discharge information lies with you and/or your care-partner.

## 2020-07-03 NOTE — Progress Notes (Signed)
Report to PACU, RN, vss, BBS= Clear.  

## 2020-07-03 NOTE — Progress Notes (Signed)
Called to room to assist during endoscopic procedure.  Patient ID and intended procedure confirmed with present staff. Received instructions for my participation in the procedure from the performing physician.  

## 2020-07-03 NOTE — Progress Notes (Signed)
VS by SM  I have reviewed the patient's medical history in detail and updated the computerized patient record.  

## 2020-07-03 NOTE — Op Note (Addendum)
[facility] Patient Name: Shelby Randolph Procedure Date: 07/03/2020 8:35 AM MRN: [id number] Endoscopist: Gatha Mayer , MD Age: 70 Referring MD:  Date of Birth: 23-Dec-1950 Gender: Female Account #: [id number] Procedure:                Colonoscopy Indications:              Lynch Syndrome Medicines:                Propofol per Anesthesia, Monitored Anesthesia Care Procedure:                Pre-Anesthesia Assessment:                           - Prior to the procedure, a History and Physical                            was performed, and patient medications and                            allergies were reviewed. The patient's tolerance of                            previous anesthesia was also reviewed. The risks                            and benefits of the procedure and the sedation                            options and risks were discussed with the patient.                            All questions were answered, and informed consent                            was obtained. Prior Anticoagulants: The patient has                            taken no previous anticoagulant or antiplatelet                            agents. ASA Grade Assessment: II - A patient with                            mild systemic disease. After reviewing the risks                            and benefits, the patient was deemed in                            satisfactory condition to undergo the procedure.                           After obtaining informed consent, the colonoscope  was passed under direct vision. Throughout the                            procedure, the patient's blood pressure, pulse, and                            oxygen saturations were monitored continuously. The                            Olympus PCF-H190DL ([id number]) Colonoscope was                            introduced through the anus and advanced to the the                            cecum, identified  by appendiceal orifice and                            ileocecal valve. The ileocecal valve, appendiceal                            orifice, and rectum were photographed. The quality                            of the bowel preparation was good. The bowel                            preparation used was Miralax via split dose                            instruction. Scope In: 9:03:58 AM Scope Out: 9:22:28 AM Scope Withdrawal Time: 0 hours 12 minutes 35 seconds  Total Procedure Duration: 0 hours 18 minutes 30 seconds  Findings:                 The perianal and digital rectal examinations were                            normal.                           Many small and large-mouthed diverticula were found                            in the entire colon.                           The exam was otherwise without abnormality on                            direct and retroflexion views. Complications:            No immediate complications. Estimated blood loss:                            None. Estimated Blood Loss:  Estimated blood loss: none. Impression:               - Moderate diverticulosis in the entire examined                            colon.                           - The examination was otherwise normal on direct                            and retroflexion views.                           - No specimens collected. Recommendation:           - Repeat colonoscopy in 1 year for screening                            purposes.                           - Resume previous diet.                           - Continue present medications.                           - Refer back to pelvic PT for refresher PT visit(s) Gatha Mayer, MD 07/03/2020 9:33:09 AM This report has been signed electronically.

## 2020-07-05 ENCOUNTER — Telehealth: Payer: Self-pay | Admitting: *Deleted

## 2020-07-05 NOTE — Telephone Encounter (Signed)
  Follow up Call-  Call back number 07/03/2020 02/06/2019 11/09/2017  Post procedure Call Back phone  # [phone] cell [phone]  Permission to leave phone message Yes Yes Yes  Some recent data might be hidden     Patient questions:  Do you have a fever, pain , or abdominal swelling? No. Pain Score  0 *  Have you tolerated food without any problems? Yes.    Have you been able to return to your normal activities? Yes.    Do you have any questions about your discharge instructions: Diet   No. Medications  No. Follow up visit  No.  Do you have questions or concerns about your Care? No.  Actions: * If pain score is 4 or above: No action needed, pain <4.  1. Have you developed a fever since your procedure? no  2.   Have you had an respiratory symptoms (SOB or cough) since your procedure? no  3.   Have you tested positive for COVID 19 since your procedure no  4.   Have you had any family members/close contacts diagnosed with the COVID 19 since your procedure?  no   If yes to any of these questions please route to Joylene John, RN and Joella Prince, RN

## 2020-07-08 ENCOUNTER — Other Ambulatory Visit: Payer: Self-pay | Admitting: Obstetrics and Gynecology

## 2020-07-08 DIAGNOSIS — R928 Other abnormal and inconclusive findings on diagnostic imaging of breast: Secondary | ICD-10-CM

## 2020-07-10 ENCOUNTER — Ambulatory Visit (INDEPENDENT_AMBULATORY_CARE_PROVIDER_SITE_OTHER): Payer: PPO | Admitting: Medical

## 2020-07-10 ENCOUNTER — Encounter: Payer: Self-pay | Admitting: Medical

## 2020-07-10 ENCOUNTER — Other Ambulatory Visit: Payer: Self-pay

## 2020-07-10 VITALS — BP 134/88 | HR 62 | Ht 68.0 in | Wt 169.4 lb

## 2020-07-10 DIAGNOSIS — E2839 Other primary ovarian failure: Secondary | ICD-10-CM

## 2020-07-10 DIAGNOSIS — Z0000 Encounter for general adult medical examination without abnormal findings: Secondary | ICD-10-CM

## 2020-07-10 DIAGNOSIS — A6000 Herpesviral infection of urogenital system, unspecified: Secondary | ICD-10-CM | POA: Diagnosis not present

## 2020-07-10 DIAGNOSIS — E559 Vitamin D deficiency, unspecified: Secondary | ICD-10-CM | POA: Diagnosis not present

## 2020-07-10 DIAGNOSIS — M8580 Other specified disorders of bone density and structure, unspecified site: Secondary | ICD-10-CM | POA: Diagnosis not present

## 2020-07-10 DIAGNOSIS — F411 Generalized anxiety disorder: Secondary | ICD-10-CM | POA: Diagnosis not present

## 2020-07-10 DIAGNOSIS — Z131 Encounter for screening for diabetes mellitus: Secondary | ICD-10-CM | POA: Diagnosis not present

## 2020-07-10 DIAGNOSIS — Z7189 Other specified counseling: Secondary | ICD-10-CM

## 2020-07-10 DIAGNOSIS — Z1509 Genetic susceptibility to other malignant neoplasm: Secondary | ICD-10-CM

## 2020-07-10 DIAGNOSIS — M24541 Contracture, right hand: Secondary | ICD-10-CM

## 2020-07-10 DIAGNOSIS — G4700 Insomnia, unspecified: Secondary | ICD-10-CM

## 2020-07-10 DIAGNOSIS — J309 Allergic rhinitis, unspecified: Secondary | ICD-10-CM | POA: Diagnosis not present

## 2020-07-10 DIAGNOSIS — J4520 Mild intermittent asthma, uncomplicated: Secondary | ICD-10-CM

## 2020-07-10 DIAGNOSIS — I700 Atherosclerosis of aorta: Secondary | ICD-10-CM

## 2020-07-10 DIAGNOSIS — Z780 Asymptomatic menopausal state: Secondary | ICD-10-CM | POA: Diagnosis not present

## 2020-07-10 DIAGNOSIS — Z136 Encounter for screening for cardiovascular disorders: Secondary | ICD-10-CM | POA: Insufficient documentation

## 2020-07-10 DIAGNOSIS — I10 Essential (primary) hypertension: Secondary | ICD-10-CM | POA: Diagnosis not present

## 2020-07-10 DIAGNOSIS — I8393 Asymptomatic varicose veins of bilateral lower extremities: Secondary | ICD-10-CM | POA: Diagnosis not present

## 2020-07-10 DIAGNOSIS — Z7185 Encounter for immunization safety counseling: Secondary | ICD-10-CM

## 2020-07-10 DIAGNOSIS — Z86010 Personal history of colonic polyps: Secondary | ICD-10-CM | POA: Diagnosis not present

## 2020-07-10 DIAGNOSIS — E785 Hyperlipidemia, unspecified: Secondary | ICD-10-CM | POA: Diagnosis not present

## 2020-07-10 DIAGNOSIS — K580 Irritable bowel syndrome with diarrhea: Secondary | ICD-10-CM

## 2020-07-10 DIAGNOSIS — B001 Herpesviral vesicular dermatitis: Secondary | ICD-10-CM

## 2020-07-10 DIAGNOSIS — L570 Actinic keratosis: Secondary | ICD-10-CM | POA: Insufficient documentation

## 2020-07-10 NOTE — Progress Notes (Signed)
Subjective:    Shelby Randolph is a 70 y.o. female who presents for Preventative Services visit and chronic medical problems/med check visit.    Primary Care Provider Margarit Minshall, Camelia Eng, PA-C here for primary care  Current Health Care Team:  Dentist, Dr. Mariel Sleet   Eye doctor, Dr. Leonie Man  Dr. Silvano Rusk, gastroenterology  Dr. Virgina Jock and Binnie Kand nurse practitioner at [facility] cardiovascular  Dr. Renda Rolls at dermatology  Dr. Radene Knee, [facility]  Dr. Sherre Poot, orthopedics, [facility]  Medical Services you may have received from other than [facility] providers in the past year (date may be approximate) Dermatology, eye doctor and dentist  Exercise Current exercise habits: Gym/ health club routine includes swimming, walking on track  and spin class.   Nutrition/Diet Current diet: in general, a "healthy" diet    Depression Screen Depression screen [facility] 2/9 07/10/2020  Decreased Interest 0  Down, Depressed, Hopeless 0  PHQ - 2 Score 0  Altered sleeping -  Tired, decreased energy -  Change in appetite -  Feeling bad or failure about yourself  -  Trouble concentrating -  Moving slowly or fidgety/restless -  Suicidal thoughts -  PHQ-9 Score -  Difficult doing work/chores -  Some recent data might be hidden    Activities of Daily Living Screen/Functional Status Survey Is the patient deaf or have difficulty hearing?: No Does the patient have difficulty seeing, even when wearing glasses/contacts?: No Does the patient have difficulty concentrating, remembering, or making decisions?: No Does the patient have difficulty walking or climbing stairs?: No Does the patient have difficulty dressing or bathing?: No Does the patient have difficulty doing errands alone such as visiting a doctor's office or shopping?: No  Can patient draw a clock face showing 11:00 oclock, yes  Fall Risk Screen Fall Risk  07/10/2020 07/10/2019 07/07/2018 07/06/2017 06/25/2016  Falls  in the past year? 0 0 0 No Yes  Number falls in past yr: - - - - 1  Injury with Fall? - - - - Yes  Comment - - - - rt hip   Risk for fall due to : No Fall Risks - - - Other (Comment)  Follow up Falls evaluation completed - Falls evaluation completed - -    Gait Assessment: Normal gait observed - yes  Advanced directives Does patient have a [place]? Yes Does patient have a Living Will? Yes  Past Medical History:  Diagnosis Date  . Abnormal screening cardiac CT 11/2011   coronary calcium score 13, 24% percentile, mild plaque in prox LAD and ostial RCA  . Adenomatous colon polyp    Dr. Silvano Rusk  . Allergy   . Anemia   . Anxiety   . Arthritis   . Asthma   . Atherosclerosis of aorta ([place]) 03/2014   abnormal on ultrasound, repeat US within 5 years  . Cancer ([place])    Skin cancer squamus   . Depression   . Diverticulosis   . Eye injury    in college, some pertinent loss of vision in left eye  . Genital herpes   . GERD (gastroesophageal reflux disease)   . H/O echocardiogram 10/28/2018   LVEF 45-50%, impaired diastolic dysfunction, mild mitral and tricuspid regurgitation; Dr. Virgina Jock  . History of bone density study 09/2011   normal  . History of mammogram    Dr. Radene Knee  . History of melanoma    sees [facility], dermatology  . History of PFTs 01/20/2011  moderate airflow limitation, no significant response to bronchodilator  . Hyperlipidemia   . Hypertension 12/2011  . Insomnia   . Migraines    Dr. Melton Alar, migraines  . MSH6-related Lynch syndrome (HNPCC5) 08/20/2017  . Nearsightedness    wears glasses  . Osteoporosis   . Post-operative nausea and vomiting   . Routine gynecological examination    Dr. Radene Knee  . Zika virus disease 06/2014    Past Surgical History:  Procedure Laterality Date  . COLONOSCOPY  2016   Dr. Silvano Rusk, 2014 Dr. Earlean Shawl  . COLONOSCOPY  01/2019   weak anal sphincter tone, moderate rectocele, polyp, scattered  diverticula; Dr. Silvano Rusk  . ESOPHAGOGASTRODUODENOSCOPY  11/2017   normal, Dr. Silvano Rusk  . ROTATOR CUFF REPAIR Right 05/2010  . TONSILLECTOMY AND ADENOIDECTOMY     as a kid  . TUBAL LIGATION  1985  . VAGINAL HYSTERECTOMY  1998   total; abnormal peroids  . VARICOSE VEIN SURGERY     bilat    Social History   Socioeconomic History  . Marital status: Widowed    Spouse name: Not on file  . Number of children: 5  . Years of education: Not on file  . Highest education level: Not on file  Occupational History  . Occupation: compliance agent (RN)    Employer: [employer]    Comment: Nurse Practitioner  Tobacco Use  . Smoking status: Never Smoker  . Smokeless tobacco: Never Used  Vaping Use  . Vaping Use: Never used  Substance and Sexual Activity  . Alcohol use: Yes    Alcohol/week: 2.0 standard drinks    Types: 2 Glasses of wine per week    Comment: occa  . Drug use: No  . Sexual activity: Not on file    Comment: exercise - walks, [place], cardio at gym 3 x/wk, yoga, widowed   Other Topics Concern  . Not on file  Social History Narrative   Lives alone, has 5 children ([place], [place], [place]), has several grandchildren, daughter Junie Panning in [place] in the [employer], exercises 5 days per week - walking, kickboxing, yoga; [employer];  Works for [employer] in [employer]   Social Determinants of Health   Financial Resource Strain: Not on file  Food Insecurity: Not on file  Transportation Needs: Not on file  Physical Activity: Not on file  Stress: Not on file  Social Connections: Not on file  Intimate Partner Violence: Not on file    Family History  Problem Relation Age of Onset  . COPD Mother        died age 86yo.  emphysema  . Colon cancer Father 52        died age 44yo  . Colon cancer Brother 49  . Breast cancer Maternal Aunt   . Colon cancer Paternal Uncle        2  . Breast cancer Daughter   . Colon cancer  Paternal Grandmother 47       colon cancer   . Diabetes Neg Hx   . Heart disease Neg Hx   . Hypertension Neg Hx   . Stroke Neg Hx   . Rectal cancer Neg Hx   . Stomach cancer Neg Hx      Current Outpatient Medications:  .  acetaminophen (TYLENOL) 500 MG tablet, 1-2 tablets twice to three times daily as needed., Disp: 100 tablet, Rfl: 5 .  acyclovir (ZOVIRAX) 200 MG capsule, 2 capsules 435m TID x  5 days for flare up, Disp: 30 capsule, Rfl: 2 .  albuterol (VENTOLIN HFA) 108 (90 Base) MCG/ACT inhaler, Inhale 2 puffs into the lungs every 4 (four) hours as needed for wheezing or shortness of breath., Disp: 18 g, Rfl: 5 .  alosetron (LOTRONEX) 1 MG tablet, Take 1 tablet (1 mg total) by mouth 2 (two) times daily., Disp: 60 tablet, Rfl: 11 .  aspirin EC 81 MG tablet, Take 1 tablet (81 mg total) by mouth daily., Disp: 90 tablet, Rfl: 3 .  atenolol (TENORMIN) 25 MG tablet, TAKE ONE TABLET EACH DAY, Disp: 90 tablet, Rfl: 3 .  atorvastatin (LIPITOR) 20 MG tablet, Take 1 tablet (20 mg total) by mouth daily., Disp: 90 tablet, Rfl: 3 .  cetirizine (ZYRTEC) 10 MG tablet, Take 10 mg by mouth daily., Disp: , Rfl:  .  cholecalciferol (VITAMIN D3) 25 MCG (1000 UNIT) tablet, Take 1 tablet (1,000 Units total) by mouth daily., Disp: 90 tablet, Rfl: 3 .  hydrochlorothiazide (MICROZIDE) 12.5 MG capsule, TAKE ONE CAPSULE EACH DAY, Disp: 90 capsule, Rfl: 3 .  ibuprofen (ADVIL) 800 MG tablet, Take 800 mg by mouth every 6 (six) hours as needed., Disp: , Rfl:  .  montelukast (SINGULAIR) 10 MG tablet, montelukast 10 mg tablet, Disp: , Rfl:  .  Multiple Vitamin (MULTIVITAMIN) capsule, Take 1 capsule by mouth daily., Disp: , Rfl:  .  ondansetron (ZOFRAN) 4 MG tablet, TAKE ONE TABLET EVERY EIGHT HOURS AS NEEDED FOR NAUSEA AND VOMITING -TO PREVENT DIARRHEA MAY USE 2 TABLETS, Disp: 60 tablet, Rfl: 5 .  Probiotic Product (PRO-BIOTIC BLEND PO), Take 1 tablet by mouth daily., Disp: , Rfl:  .  zolpidem (AMBIEN) 10 MG tablet,  TAKE 1/2 TO 1 TABLET AT BEDTIME, Disp: 90 tablet, Rfl: 1  Current Facility-Administered Medications:  .  albuterol (PROVENTIL) (5 MG/ML) 0.5% nebulizer solution 2.5 mg, 2.5 mg, Nebulization, Once, Rita Ohara, MD  Allergies  Allergen Reactions  . Sulfonamide Derivatives Shortness Of Breath and Swelling  . Mucinex [Guaifenesin Er] Other (See Comments)    "Jittery and hypes me up"    History reviewed: allergies, current medications, past family history, past medical history, past social history, past surgical history and problem list  Chronic issues discussed: Compliant with her routine medications  Within the past year she had a heart evaluation, colonoscopy, just recently had mammogram, Pap smear, and has to repeat colonoscopy next year.  She has other imaging for breast coming up soon due to some findings  She just recently did a bone density test as well with gynecology, reportedly stable not showing osteoporosis   Acute issues discussed: The only recent issues, she retired within the past 6 months and is still having a hard time figuring out what to do it herself.  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  She has looked into some volunteer opportunities but she cannot quite get settled  Objective:      Biometrics BP 134/88   Pulse 62   Ht 5' 8"  (1.727 m)   Wt 169 lb 6.4 oz (76.8 kg)   SpO2 98%   BMI 25.76 kg/m   Cognitive Testing  Alert? Yes  Normal Appearance?Yes  Oriented to person? Yes  Place? Yes   Time? Yes  Recall of three objects?  Yes  Can perform simple calculations? Yes  Displays appropriate judgment?Yes  Can read the correct time from a watch face?Yes  General appearance: alert, no distress, WD/WN, white female  Nutritional  Status: Inadequate calore intake? no Loss of muscle mass? no Loss of fat beneath skin? no Localized or general edema? no Diminished functional status? no  Other pertinent exam: HEENT: normocephalic, sclerae  anicteric, TMs pearly, nares patent, no discharge or erythema, pharynx normal Oral cavity: MMM, no lesions Neck: supple, no lymphadenopathy, no thyromegaly, no masses Heart: RRR, normal S1, S2, no murmurs Lungs: CTA bilaterally, no wheezes, rhonchi, or rales Abdomen: +bs, soft, non tender, non distended, no masses, no hepatomegaly, no splenomegaly Musculoskeletal: +contracture, mild of right hand volar surface affecting 3rd and 4th digits along mid to distal metacarpals, no flexion deformity, otherwise UE and LE nontender, no swelling, no obvious deformity Extremities: moderate varicose veins bilat LE, no edema, no cyanosis, no clubbing Pulses: 2+ symmetric, upper and lower extremities, normal cap refill Neurological: alert, oriented x 3, CN2-12 intact, strength normal upper extremities and lower extremities, sensation normal throughout, DTRs 2+ throughout, no cerebellar signs, gait normal Psychiatric: normal affect, behavior normal, pleasant  GU/rectal/breast - deferred to gynecology Skin : scattered macules, no worrisome findings, pink/red scar on right posteromedial superior lower leg/calve region from prior biopsy    Assessment:   Encounter Diagnoses  Name Primary?  . Encounter for health maintenance examination in adult Yes  . Medicare annual wellness visit, subsequent   . Vitamin D deficiency   . Varicose veins of both lower extremities, unspecified whether complicated   . Vaccine counseling   . Allergic rhinitis, unspecified seasonality, unspecified trigger   . Post-menopausal   . Osteopenia, unspecified location   . Lynch syndrome   . Insomnia, unspecified type   . Irritable bowel syndrome with diarrhea   . History of colonic polyps   . Genital herpes simplex, unspecified site   . Generalized anxiety disorder   . Estrogen deficiency   . Essential hypertension   . Dyslipidemia   . Mild intermittent asthma without complication   . Atherosclerosis of abdominal aorta ([place])    . Recurrent cold sores   . Screening for diabetes mellitus   . Encounter for screening for vascular disease   . Advanced directives, counseling/discussion   . Actinic keratoses   . Contracture of right hand      Plan:   A preventative services visit was completed today.  During the course of the visit today, we discussed and counseled about appropriate screening and preventive services.  A health risk assessment was established today that included a review of current medications, allergies, social history, family history, medical and preventative health history, biometrics, and preventative screenings to identify potential safety concerns or impairments.  A personalized plan was printed today for your records and use.   Personalized health advice and education was given today to reduce health risks and promote self management and wellness.  Information regarding end of life planning was discussed today.   Recommendations: Continue to return yearly for your annual wellness and preventative care visits.  This gives [place] a chance to discuss healthy lifestyle, exercise, vaccinations, review your chart record, and perform screenings where appropriate.  I recommend you see your eye doctor yearly for routine vision care.  I recommend you see your dentist yearly for routine dental care including hygiene visits twice yearly.  Advanced directives - discussed nature and purpose of Advanced Directives, encouraged them to complete them if they have not done so and/or encouraged them to get [place] a copy if they have done this already.   Vaccination recommendations were reviewed You are up-to-date on all vaccines  including pneumococcal, COVID, tetanus, shingles, flu   Screening for cancer: Breast cancer screening: You should perform a self breast exam monthly.   We reviewed recommendations for regular mammograms and breast cancer screening. Follow-up with additional screening as advised to  gynecology  Colon cancer screening:  I reviewed your colonoscopy on file that is up to date from February 2022.  Repeat colonoscopy in 1 year per their report You were given stool cards kit to return for hemoccult screening  Cervical cancer screening: We reviewed recommendations for pap smear screening.  Last Pap smear from 2021 reviewed  Skin cancer screening: Check your skin regularly for new changes, growing lesions, or other lesions of concern Continue routine follow-up with dermatology  Lung cancer screening: If you have a greater than 30 pack year history of tobacco use, then you qualify for lung cancer screening with a chest CT scan  We currently don't have screenings for other cancers besides breast, cervical, colon, and lung cancers.  If you have a strong family history of cancer or have other cancer screening concerns, please let me know.    Bone health: Get at least 150 minutes of aerobic exercise weekly Get weight bearing exercise at least once weekly We will request recent bone density test you had through gynecology   Heart health: Get at least 150 minutes of aerobic exercise weekly Limit alcohol It is important to maintain a healthy blood pressure and healthy cholesterol numbers  She sees cardiology  Separate significant issues discussed: Screening for diabetes-hemoglobin A1c today  Advise ABI screen for vascular disease  Aortic atherosclerosis, hyperlipidemia-continue statin, labs today  hypertension continue current medication  Osteopenia-continue vitamin D supplement, we discussed weightbearing exercise adding that in more than she is doing now, continue aerobic activity, we will request recent bone density test from gynecology  Actinic keratosis-continue routine surveillance with dermatology  Varicose veins-she declines to wear compression hose.  Continue regular exercise  Insomnia-uses Ambien without any major issues.  Does fine on this.  She is  aware of risk versus benefits  Asthma-doing fine on albuterol as needed.  Has not used in the last few months.  Continues on Singulair  Allergic rhinitis-continue Singulair  IBS-on medication, managed by GI  Cold sores, genital herpes -  She will consider daily preventative dosing.  Hand contracture - consider hand specialist referral   [place] was seen today for medicare wellness.  Diagnoses and all orders for this visit:  Encounter for health maintenance examination in adult -     Comprehensive metabolic panel -     CBC -     Lipid panel -     Hemoglobin A1c -     VITAMIN D 25 Hydroxy (Vit-D Deficiency, Fractures)  Medicare annual wellness visit, subsequent  Vitamin D deficiency -     VITAMIN D 25 Hydroxy (Vit-D Deficiency, Fractures)  Varicose veins of both lower extremities, unspecified whether complicated  Vaccine counseling  Allergic rhinitis, unspecified seasonality, unspecified trigger  Post-menopausal  Osteopenia, unspecified location  Lynch syndrome  Insomnia, unspecified type  Irritable bowel syndrome with diarrhea  History of colonic polyps  Genital herpes simplex, unspecified site  Generalized anxiety disorder  Estrogen deficiency  Essential hypertension -     Comprehensive metabolic panel -     CBC -     Lipid panel  Dyslipidemia -     Lipid panel  Mild intermittent asthma without complication  Atherosclerosis of abdominal aorta (HCC)  Recurrent cold sores  Screening for diabetes  mellitus -     Hemoglobin A1c  Encounter for screening for vascular disease  Advanced directives, counseling/discussion  Actinic keratoses  Contracture of right hand   Follow-up pending labs, yearly for physical   Medicare Attestation A preventative services visit was completed today.  During the course of the visit the patient was educated and counseled about appropriate screening and preventive services.  A health risk assessment was  established with the patient that included a review of current medications, allergies, social history, family history, medical and preventative health history, biometrics, and preventative screenings to identify potential safety concerns or impairments.  A personalized plan was printed today for the patient's records and use.   Personalized health advice and education was given today to reduce health risks and promote self management and wellness.  Information regarding end of life planning was discussed today.  Dorothea Ogle, PA-C   07/10/2020

## 2020-07-11 ENCOUNTER — Other Ambulatory Visit: Payer: Self-pay | Admitting: Medical

## 2020-07-11 DIAGNOSIS — J4530 Mild persistent asthma, uncomplicated: Secondary | ICD-10-CM

## 2020-07-11 LAB — COMPREHENSIVE METABOLIC PANEL
ALT: 19 IU/L (ref 0–32)
AST: 23 IU/L (ref 0–40)
Albumin/Globulin Ratio: 2 (ref 1.2–2.2)
Albumin: 4.6 g/dL (ref 3.8–4.8)
Alkaline Phosphatase: 74 IU/L (ref 44–121)
BUN/Creatinine Ratio: 18 (ref 12–28)
BUN: 16 mg/dL (ref 8–27)
Bilirubin Total: 0.6 mg/dL (ref 0.0–1.2)
CO2: 24 mmol/L (ref 20–29)
Calcium: 9.4 mg/dL (ref 8.7–10.3)
Chloride: 106 mmol/L (ref 96–106)
Creatinine, Ser: 0.91 mg/dL (ref 0.57–1.00)
Globulin, Total: 2.3 g/dL (ref 1.5–4.5)
Glucose: 103 mg/dL — ABNORMAL HIGH (ref 65–99)
Potassium: 4.5 mmol/L (ref 3.5–5.2)
Sodium: 144 mmol/L (ref 134–144)
Total Protein: 6.9 g/dL (ref 6.0–8.5)
eGFR: 68 mL/min/{1.73_m2} (ref >59)

## 2020-07-11 LAB — CBC
Hematocrit: 38.9 % (ref 34.0–46.6)
Hemoglobin: 12.9 g/dL (ref 11.1–15.9)
MCH: 30.4 pg (ref 26.6–33.0)
MCHC: 33.2 g/dL (ref 31.5–35.7)
MCV: 92 fL (ref 79–97)
Platelets: 182 10*3/uL (ref 150–450)
RBC: 4.25 x10E6/uL (ref 3.77–5.28)
RDW: 12.7 % (ref 11.7–15.4)
WBC: 4.1 10*3/uL (ref 3.4–10.8)

## 2020-07-11 LAB — LIPID PANEL
Chol/HDL Ratio: 2.7 ratio (ref 0.0–4.4)
Cholesterol, Total: 197 mg/dL (ref 100–199)
HDL: 73 mg/dL (ref >39)
LDL Chol Calc (NIH): 102 mg/dL — ABNORMAL HIGH (ref 0–99)
Triglycerides: 127 mg/dL (ref 0–149)
VLDL Cholesterol Cal: 22 mg/dL (ref 5–40)

## 2020-07-11 LAB — HEMOGLOBIN A1C
Est. average glucose Bld gHb Est-mCnc: 105 mg/dL
Hgb A1c MFr Bld: 5.3 % (ref 4.8–5.6)

## 2020-07-11 LAB — VITAMIN D 25 HYDROXY (VIT D DEFICIENCY, FRACTURES): Vit D, 25-Hydroxy: 49.8 ng/mL (ref 30.0–100.0)

## 2020-07-11 MED ORDER — VITAMIN D 25 MCG (1000 UNIT) PO TABS: 1000.0000 [IU] | ORAL_TABLET | Freq: Every day | ORAL | 3 refills | Status: AC

## 2020-07-11 MED ORDER — ALBUTEROL SULFATE HFA 108 (90 BASE) MCG/ACT IN AERS: 2.0000 | INHALATION_SPRAY | RESPIRATORY_TRACT | 2 refills | Status: DC | PRN

## 2020-07-11 MED ORDER — ATENOLOL 25 MG PO TABS: ORAL_TABLET | ORAL | 3 refills | Status: DC

## 2020-07-11 MED ORDER — ASPIRIN EC 81 MG PO TBEC: 81.0000 mg | DELAYED_RELEASE_TABLET | Freq: Every day | ORAL | 3 refills | Status: DC

## 2020-07-11 MED ORDER — ATORVASTATIN CALCIUM 20 MG PO TABS: 20.0000 mg | ORAL_TABLET | Freq: Every day | ORAL | 3 refills | Status: DC

## 2020-07-12 ENCOUNTER — Encounter: Payer: Self-pay | Admitting: Internal Medicine

## 2020-07-13 ENCOUNTER — Other Ambulatory Visit: Payer: Self-pay

## 2020-07-13 ENCOUNTER — Ambulatory Visit
Admission: RE | Admit: 2020-07-13 | Discharge: 2020-07-13 | Disposition: A | Discharge status: Home / Self Care | Payer: PPO | Source: Ambulatory Visit | Attending: Obstetrics and Gynecology | Admitting: Obstetrics and Gynecology

## 2020-07-13 ENCOUNTER — Ambulatory Visit: Payer: PPO

## 2020-07-13 DIAGNOSIS — R928 Other abnormal and inconclusive findings on diagnostic imaging of breast: Secondary | ICD-10-CM

## 2020-07-13 DIAGNOSIS — R922 Inconclusive mammogram: Secondary | ICD-10-CM | POA: Diagnosis not present

## 2020-07-19 ENCOUNTER — Telehealth: Payer: Self-pay | Admitting: Medical

## 2020-07-19 NOTE — Telephone Encounter (Signed)
Quested records received from [facility]

## 2020-08-15 ENCOUNTER — Ambulatory Visit: Payer: PPO | Admitting: Physical Therapy

## 2020-08-16 ENCOUNTER — Encounter: Payer: Self-pay | Admitting: Medical

## 2020-08-19 ENCOUNTER — Other Ambulatory Visit: Payer: Self-pay

## 2020-08-19 ENCOUNTER — Ambulatory Visit (INDEPENDENT_AMBULATORY_CARE_PROVIDER_SITE_OTHER): Payer: PPO | Admitting: Family Medicine

## 2020-08-19 ENCOUNTER — Encounter: Payer: Self-pay | Admitting: Family Medicine

## 2020-08-19 VITALS — BP 120/70 | HR 62 | Temp 97.6°F | Wt 172.4 lb

## 2020-08-19 DIAGNOSIS — Z23 Encounter for immunization: Secondary | ICD-10-CM | POA: Diagnosis not present

## 2020-08-19 DIAGNOSIS — R3915 Urgency of urination: Secondary | ICD-10-CM | POA: Diagnosis not present

## 2020-08-19 DIAGNOSIS — N3000 Acute cystitis without hematuria: Secondary | ICD-10-CM

## 2020-08-19 LAB — POCT URINALYSIS DIP (PROADVANTAGE DEVICE)
Bilirubin, UA: NEGATIVE
Blood, UA: NEGATIVE
Glucose, UA: NEGATIVE mg/dL
Ketones, POC UA: NEGATIVE mg/dL
Nitrite, UA: POSITIVE — AB
Protein Ur, POC: NEGATIVE mg/dL
Specific Gravity, Urine: 1.025
Urobilinogen, Ur: NEGATIVE
pH, UA: 6 (ref 5.0–8.0)

## 2020-08-19 MED ORDER — NITROFURANTOIN MONOHYD MACRO 100 MG PO CAPS: 100.0000 mg | ORAL_CAPSULE | Freq: Two times a day (BID) | ORAL | 0 refills | Status: DC

## 2020-08-19 NOTE — Progress Notes (Signed)
Subjective:  Shelby Randolph is a 70 y.o. female who complains of possible urinary tract infection.  She has had symptoms for 4 days.  Symptoms include dysuria, urinary urgency, and frequency. Patient denies fever, chills, abdominal pain, back pain, N/V/D.  Last UTI was years ago.   Using nothing for current symptoms.    Patient does not have a history of recurrent UTI. Patient does not have a history of pyelonephritis.  No other aggravating or relieving factors.  No other c/o.  Past Medical History:  Diagnosis Date  . Abnormal screening cardiac CT 11/2011   coronary calcium score 13, 24% percentile, mild plaque in prox LAD and ostial RCA  . Adenomatous colon polyp    Dr. Silvano Rusk  . Allergy   . Anemia   . Anxiety   . Arthritis   . Asthma   . Atherosclerosis of aorta ([place]) 03/2014   abnormal on ultrasound, repeat US within 5 years  . Cancer ([place])    Skin cancer squamus   . Depression   . Diverticulosis   . Eye injury    in college, some pertinent loss of vision in left eye  . Genital herpes   . GERD (gastroesophageal reflux disease)   . H/O echocardiogram 10/28/2018   LVEF 45-50%, impaired diastolic dysfunction, mild mitral and tricuspid regurgitation; Dr. Virgina Jock  . History of bone density study 09/2011   normal  . History of mammogram    Dr. Radene Knee  . History of melanoma    sees [facility], dermatology  . History of PFTs 01/20/2011   moderate airflow limitation, no significant response to bronchodilator  . Hyperlipidemia   . Hypertension 12/2011  . Insomnia   . Migraines    Dr. Melton Alar, migraines  . MSH6-related Lynch syndrome (HNPCC5) 08/20/2017  . Nearsightedness    wears glasses  . Osteoporosis   . Post-operative nausea and vomiting   . Routine gynecological examination    Dr. Radene Knee  . Zika virus disease 06/2014    ROS as in subjective  Reviewed allergies, medications, past medical, surgical, and social history.    Objective: Vitals:   08/19/20 1433   BP: 120/70  Pulse: 62  Temp: 97.6 F (36.4 C)    General appearance: alert, no distress, WD/WN, female Abdomen: +bs, soft, non tender, non distended Back: no CVA tenderness GU: declines      Laboratory:  Urine dipstick: trace for leukocyte esterase and +nit     Assessment: Acute cystitis without hematuria - Plan: nitrofurantoin, macrocrystal-monohydrate, (MACROBID) 100 MG capsule, POCT Urinalysis DIP (Proadvantage Device)  Urinary urgency - Plan: POCT Urinalysis DIP (Proadvantage Device)    Plan: Discussed symptoms, diagnosis, possible complications, and usual course of illness.  Macrobid prescribed (sulfa allergy)   Advised increased water intake, can use OTC Tylenol for pain.    Advised she may take AZO for 24 hours if needed.   Urine culture not sent.   Call or return if worse or not improving.

## 2020-09-19 DIAGNOSIS — M25562 Pain in left knee: Secondary | ICD-10-CM | POA: Diagnosis not present

## 2020-09-26 ENCOUNTER — Other Ambulatory Visit: Payer: Self-pay | Admitting: Medical

## 2020-10-21 DIAGNOSIS — H9193 Unspecified hearing loss, bilateral: Secondary | ICD-10-CM

## 2020-11-08 ENCOUNTER — Other Ambulatory Visit: Payer: Self-pay | Admitting: Medical

## 2020-11-11 ENCOUNTER — Telehealth: Payer: Self-pay | Admitting: Medical

## 2020-11-11 NOTE — Telephone Encounter (Signed)
Let her know we are schedule her with audiology for hearing eval

## 2020-11-12 NOTE — Telephone Encounter (Signed)
Pt has history of this and would like this to be sent in

## 2020-11-12 NOTE — Telephone Encounter (Signed)
Will put referral in

## 2020-11-22 DIAGNOSIS — H903 Sensorineural hearing loss, bilateral: Secondary | ICD-10-CM | POA: Diagnosis not present

## 2020-12-20 ENCOUNTER — Ambulatory Visit: Payer: PPO | Admitting: Cardiology

## 2020-12-21 ENCOUNTER — Other Ambulatory Visit: Payer: Self-pay | Admitting: Medical

## 2020-12-31 ENCOUNTER — Encounter: Payer: Self-pay | Admitting: Medical

## 2021-01-01 ENCOUNTER — Encounter: Payer: Self-pay | Admitting: Cardiology

## 2021-01-01 ENCOUNTER — Ambulatory Visit: Payer: PPO | Admitting: Cardiology

## 2021-01-01 ENCOUNTER — Other Ambulatory Visit: Payer: Self-pay

## 2021-01-01 VITALS — BP 123/74 | HR 77 | Temp 97.9°F | Resp 16 | Ht 68.0 in | Wt 171.0 lb

## 2021-01-01 DIAGNOSIS — I2584 Coronary atherosclerosis due to calcified coronary lesion: Secondary | ICD-10-CM

## 2021-01-01 DIAGNOSIS — I10 Essential (primary) hypertension: Secondary | ICD-10-CM

## 2021-01-01 DIAGNOSIS — R9431 Abnormal electrocardiogram [ECG] [EKG]: Secondary | ICD-10-CM | POA: Diagnosis not present

## 2021-01-01 DIAGNOSIS — I2510 Atherosclerotic heart disease of native coronary artery without angina pectoris: Secondary | ICD-10-CM | POA: Diagnosis not present

## 2021-01-01 DIAGNOSIS — R0609 Other forms of dyspnea: Secondary | ICD-10-CM

## 2021-01-01 DIAGNOSIS — Z712 Person consulting for explanation of examination or test findings: Secondary | ICD-10-CM | POA: Diagnosis not present

## 2021-01-01 DIAGNOSIS — E785 Hyperlipidemia, unspecified: Secondary | ICD-10-CM

## 2021-01-01 DIAGNOSIS — R0600 Dyspnea, unspecified: Secondary | ICD-10-CM

## 2021-01-01 MED ORDER — OLMESARTAN MEDOXOMIL 20 MG PO TABS: 20.0000 mg | ORAL_TABLET | Freq: Every evening | ORAL | 0 refills | Status: DC

## 2021-01-01 MED ORDER — METOPROLOL SUCCINATE ER 25 MG PO TB24: 25.0000 mg | ORAL_TABLET | Freq: Every day | ORAL | 0 refills | Status: DC

## 2021-01-01 NOTE — Progress Notes (Signed)
Shelby Randolph Date of Birth: 1950/10/22 MRN: [id number] Primary Care Provider:Tysinger, Camelia Eng, PA-C Former Cardiology Providers: Jeri Lager, APRN, FNP-C Primary Cardiologist: Rex Kras, DO, [facility] (established care 12/22/2019)  Date: 01/01/21 Last Office Visit: 12/22/2019  Chief Complaint  Patient presents with   Coronary Artery Disease   mild reduced LVEF   Follow-up    HPI  Shelby Randolph is a 70 y.o.  female who works as a Designer, jewellery at [employer] and presents to the office with a chief complaint of " 1 year follow-up for mildly reduced LVEF." Patient's past medical history and cardiovascular risk factors include: Hypertension, hyperlipidemia, aortic atherosclerosis, depression, GERD, Lynch syndrome, coronary artery calcification, mildly reduced LVEF, postmenopausal female, advanced age.  Formally under the care of [facility] and work-up noted mildly reduced LVEF.  She underwent coronary CTA which illustrated a coronary artery calcification and minimal CAD.  She has been managed medically and follows up for her 1 year office visit.  Patient denies any chest pain at rest or with effort related activities.  However at times patient states that she does experience dyspnea with overexertion.  She denies any hospitalizations or urgent care visits since last office encounter for cardiovascular symptoms.  Patient initially retired and now is back to work part-time.  She is ambulating at least 7 miles over the course of 1 week without any significant effort related symptoms.  FUNCTIONAL STATUS: Walks one mile per day. About 2 years ago was walking 30 miles a week.    ALLERGIES: Allergies  Allergen Reactions   Sulfonamide Derivatives Shortness Of Breath and Swelling   Mucinex [Guaifenesin Er] Other (See Comments)    "Jittery and hypes me up"     MEDICATION LIST PRIOR TO VISIT: Current Outpatient Medications on File Prior to Visit  Medication Sig Dispense  Refill   acetaminophen (TYLENOL) 500 MG tablet 1-2 tablets twice to three times daily as needed. 100 tablet 5   acyclovir (ZOVIRAX) 400 MG tablet TAKE ONE TABLET TWICE DAILY FOR SUPPRESSION OR 3 TIMES DAILY FOR 5 DAYS FOR FLARE-UP 90 tablet 3   albuterol (VENTOLIN HFA) 108 (90 Base) MCG/ACT inhaler Inhale 2 puffs into the lungs every 4 (four) hours as needed for wheezing or shortness of breath. 18 g 2   alosetron (LOTRONEX) 1 MG tablet Take 1 tablet (1 mg total) by mouth 2 (two) times daily. 60 tablet 11   aspirin EC 81 MG tablet Take 1 tablet (81 mg total) by mouth daily. 90 tablet 3   atorvastatin (LIPITOR) 20 MG tablet Take 1 tablet (20 mg total) by mouth daily. 90 tablet 3   cetirizine (ZYRTEC) 10 MG tablet Take 10 mg by mouth daily.     cholecalciferol (VITAMIN D3) 25 MCG (1000 UNIT) tablet Take 1 tablet (1,000 Units total) by mouth daily. 90 tablet 3   hydrochlorothiazide (MICROZIDE) 12.5 MG capsule TAKE ONE CAPSULE EACH DAY 90 capsule 3   ibuprofen (ADVIL) 800 MG tablet Take 800 mg by mouth every 6 (six) hours as needed.     Probiotic Product (PRO-BIOTIC BLEND PO) Take 1 tablet by mouth daily.     zolpidem (AMBIEN) 10 MG tablet TAKE 1/2 TO 1 TABLET AT BEDTIME 90 tablet 0   Multiple Vitamin (MULTIVITAMIN) capsule Take 1 capsule by mouth daily.     Current Facility-Administered Medications on File Prior to Visit  Medication Dose Route Frequency Provider Last Rate Last Admin   albuterol (PROVENTIL) (5 MG/ML) 0.5% nebulizer solution  2.5 mg  2.5 mg Nebulization Once Rita Ohara, MD        PAST MEDICAL HISTORY: Past Medical History:  Diagnosis Date   Abnormal screening cardiac CT 11/2011   coronary calcium score 13, 24% percentile, mild plaque in prox LAD and ostial RCA   Adenomatous colon polyp    Dr. Silvano Rusk   Allergy    Anemia    Anxiety    Arthritis    Asthma    Atherosclerosis of aorta ([place]) 03/2014   abnormal on ultrasound, repeat US within 5 years   Cancer [facility])    Skin  cancer squamus    Depression    Diverticulosis    Eye injury    in college, some pertinent loss of vision in left eye   Genital herpes    GERD (gastroesophageal reflux disease)    H/O echocardiogram 10/28/2018   LVEF 45-50%, impaired diastolic dysfunction, mild mitral and tricuspid regurgitation; Dr. Virgina Jock   History of bone density study 09/2011   normal   History of mammogram    Dr. Radene Knee   History of melanoma    sees Lavonna Monarch, dermatology   History of PFTs 01/20/2011   moderate airflow limitation, no significant response to bronchodilator   Hyperlipidemia    Hypertension 12/2011   Insomnia    Migraines    Dr. Melton Alar, migraines   MSH6-related Lynch syndrome (HNPCC5) 08/20/2017   Nearsightedness    wears glasses   Osteoporosis    Post-operative nausea and vomiting    Routine gynecological examination    Dr. Sharene Butters virus disease 06/2014    PAST SURGICAL HISTORY: Past Surgical History:  Procedure Laterality Date   COLONOSCOPY  2016   Dr. Silvano Rusk, 2014 Dr. Earlean Shawl   COLONOSCOPY  01/2019   weak anal sphincter tone, moderate rectocele, polyp, scattered diverticula; Dr. Silvano Rusk   ESOPHAGOGASTRODUODENOSCOPY  11/2017   normal, Dr. Silvano Rusk   ROTATOR CUFF REPAIR Right 05/2010   TONSILLECTOMY AND ADENOIDECTOMY     as a kid   [place]   total; abnormal peroids   VARICOSE VEIN SURGERY     bilat    FAMILY HISTORY: The patient's family history includes Breast cancer in her daughter and maternal aunt; COPD in her mother; Colon cancer in her paternal uncle; Colon cancer (age of onset: 51) in her brother; Colon cancer (age of onset: 105) in her father; Colon cancer (age of onset: 24) in her paternal grandmother.   SOCIAL HISTORY:  The patient  reports that she has never smoked. She has never used smokeless tobacco. She reports current alcohol use of about 2.0 standard drinks per week. She reports that she does not  use drugs.  Review of Systems  Constitutional: Negative for chills and fever.  HENT:  Negative for hoarse voice and nosebleeds.   Eyes:  Negative for discharge, double vision and pain.  Cardiovascular:  Positive for dyspnea on exertion (chronic and stable). Negative for chest pain, claudication, leg swelling, near-syncope, orthopnea, palpitations, paroxysmal nocturnal dyspnea and syncope.  Respiratory:  Negative for hemoptysis and shortness of breath.   Musculoskeletal:  Negative for muscle cramps and myalgias.  Gastrointestinal:  Negative for abdominal pain, constipation, diarrhea, hematemesis, hematochezia, melena, nausea and vomiting.  Neurological:  Negative for dizziness and light-headedness.   PHYSICAL EXAM: Vitals with BMI 01/01/2021 08/19/2020 07/10/2020  Height 5' 8"  - 5' 8"   Weight 171 lbs 172 lbs 6  oz 169 lbs 6 oz  BMI 63.78 - 58.85  Systolic 027 741 287  Diastolic 74 70 88  Pulse 77 62 62    CONSTITUTIONAL: Well-developed and well-nourished. No acute distress.  SKIN: Skin is warm and dry. No rash noted. No cyanosis. No pallor. No jaundice HEAD: Normocephalic and atraumatic.  EYES: No scleral icterus MOUTH/THROAT: Moist oral membranes.  NECK: No JVD present. No thyromegaly noted. No carotid bruits  LYMPHATIC: No visible cervical adenopathy.  CHEST Normal respiratory effort. No intercostal retractions  LUNGS: Clear to auscultation bilaterally.  No stridor. No wheezes. No rales.  CARDIOVASCULAR: Regular rate and rhythm, positive S1-S2, no murmurs rubs or gallops appreciated. ABDOMINAL: No apparent ascites.  EXTREMITIES: No peripheral edema, 2+ DP and PT pulses.,  Warm to touch. HEMATOLOGIC: No significant bruising NEUROLOGIC: Oriented to person, place, and time. Nonfocal. Normal muscle tone.  PSYCHIATRIC: Normal mood and affect. Normal behavior. Cooperative  RADIOLOGY: Coronary CTA 11/16/2018:  1. Coronary calcium score of 13. This was 24th percentile for age and sex  matched control. This is noted in the proximal and mid LAD.   2. Normal coronary origin with right dominance.   3. Minimal calcified plaque in the prox LAD and ostial RCA with no significant stenosis.   4. Aggressive medical therapy is recommended.  CARDIAC DATABASE: EKG: 01/01/2021: Normal sinus rhythm, 67 bpm, left axis, LVH per voltage criteria.  Without underlying injury pattern.  Echocardiogram: 12/13/2019:  Left ventricle cavity is normal in size. Mild concentric hypertrophy of the left ventricle. Mildly global hypokinesis. LVEF 45-50%. Normal global wall motion. Doppler evidence of grade I (impaired) diastolic dysfunction, normal LAP.  Mild (Grade I) mitral regurgitation.  Mild tricuspid regurgitation.  No evidence of pulmonary hypertension.  Stress Testing:  Lexiscan Tetrofosmin Stress Test 01/18/2020: Nondiagnostic ECG stress. There is a fixed moderate defect in the inferior and apical regions suggestive of soft tissue attenuation without ischemia or scar.  Overall LV systolic function is normal without regional wall motion abnormalities. Stress LV EF: 61%.  No previous exam available for comparison. Low risk.   Heart Catheterization: None  LABORATORY DATA: CBC Latest Ref Rng & Units 07/10/2020 07/10/2019 01/24/2018  WBC 3.4 - 10.8 x10E3/uL 4.1 5.3 4.6  Hemoglobin 11.1 - 15.9 g/dL 12.9 13.3 13.0  Hematocrit 34.0 - 46.6 % 38.9 38.9 38.5  Platelets 150 - 450 x10E3/uL 182 196 164    CMP Latest Ref Rng & Units 07/10/2020 07/10/2019 11/07/2018  Glucose 65 - 99 mg/dL 103(H) 109(H) 106(H)  BUN 8 - 27 mg/dL 16 26 17   Creatinine 0.57 - 1.00 mg/dL 0.91 0.81 0.90  Sodium 134 - 144 mmol/L 144 143 142  Potassium 3.5 - 5.2 mmol/L 4.5 4.5 4.3  Chloride 96 - 106 mmol/L 106 106 103  CO2 20 - 29 mmol/L 24 21 25   Calcium 8.7 - 10.3 mg/dL 9.4 9.8 9.8  Total Protein 6.0 - 8.5 g/dL 6.9 6.8 -  Total Bilirubin 0.0 - 1.2 mg/dL 0.6 0.4 -  Alkaline Phos 44 - 121 IU/L 74 75 -  AST 0 - 40 IU/L 23 30  -  ALT 0 - 32 IU/L 19 19 -    Lipid Panel  Lab Results  Component Value Date   CHOL 197 07/10/2020   HDL 73 07/10/2020   LDLCALC 102 (H) 07/10/2020   LDLDIRECT 140.1 03/01/2009   TRIG 127 07/10/2020   CHOLHDL 2.7 07/10/2020     Lab Results  Component Value Date   HGBA1C 5.3 07/10/2020  HGBA1C 5.4 05/23/2015   No components found for: NTPROBNP Lab Results  Component Value Date   TSH 2.510 07/10/2019   TSH 1.700 07/07/2018   TSH 1.97 06/25/2016    Cardiac Panel (last 3 results) No results for input(s): CKTOTAL, CKMB, TROPONINIHS, RELINDX in the last 72 hours.  IMPRESSION:    ICD-10-CM   1. Dyspnea on exertion  R06.00 PCV ECHOCARDIOGRAM COMPLETE    2. Essential hypertension  I10 metoprolol succinate (TOPROL XL) 25 MG 24 hr tablet    olmesartan (BENICAR) 20 MG tablet    Basic metabolic panel    Magnesium    3. Coronary atherosclerosis due to calcified coronary lesion of native artery  I25.10 EKG 12-Lead   I25.84     4. Abnormal EKG  R94.31     5. Mild hyperlipidemia  E78.5     6. Encounter to discuss test results  Z71.2        RECOMMENDATIONS: ZYAN MIRKIN is a 70 y.o. female whose past medical history and cardiovascular risk factors include: Hypertension, hyperlipidemia, aortic atherosclerosis, depression, GERD, Lynch syndrome, coronary artery calcification, mildly reduced LVEF, postmenopausal female, advanced age.  Dyspnea on exertion: Chronic and stable.   EKG nonischemic. Noted to have mildly reduced LVEF, in the past.  Discussed up titration of GDMT.   We will transition her from atenolol to metoprolol.   Start olmesartan 20 mg p.o. daily.   Blood work in 1 week to evaluate kidney function and electrolytes. Plan echo prior to the next office visit.    Hypercholesterolemia: Most recent labs from 07/2020 reviewed. Currently managed by primary care provider. Continue statin therapy.  Coronary artery calcification: Total CAC 13 AU Continue  aspirin and statin therapy. Most recent nuclear stress test results reviewed since last office visit.  Overall low risk study.  Remains asymptomatic.  Benign essential hypertension: Office blood pressure is within acceptable range..  Medication reconciled.  Low salt diet recommended. A diet that is rich in fruits, vegetables, legumes, and low-fat dairy products and low in snacks, sweets, and meats (such as the Dietary Approaches to Stop Hypertension [DASH] diet).   FINAL MEDICATION LIST END OF ENCOUNTER: Meds ordered this encounter  Medications   metoprolol succinate (TOPROL XL) 25 MG 24 hr tablet    Sig: Take 1 tablet (25 mg total) by mouth daily.    Dispense:  30 tablet    Refill:  0   olmesartan (BENICAR) 20 MG tablet    Sig: Take 1 tablet (20 mg total) by mouth every evening.    Dispense:  30 tablet    Refill:  0     Current Outpatient Medications:    acetaminophen (TYLENOL) 500 MG tablet, 1-2 tablets twice to three times daily as needed., Disp: 100 tablet, Rfl: 5   acyclovir (ZOVIRAX) 400 MG tablet, TAKE ONE TABLET TWICE DAILY FOR SUPPRESSION OR 3 TIMES DAILY FOR 5 DAYS FOR FLARE-UP, Disp: 90 tablet, Rfl: 3   albuterol (VENTOLIN HFA) 108 (90 Base) MCG/ACT inhaler, Inhale 2 puffs into the lungs every 4 (four) hours as needed for wheezing or shortness of breath., Disp: 18 g, Rfl: 2   alosetron (LOTRONEX) 1 MG tablet, Take 1 tablet (1 mg total) by mouth 2 (two) times daily., Disp: 60 tablet, Rfl: 11   aspirin EC 81 MG tablet, Take 1 tablet (81 mg total) by mouth daily., Disp: 90 tablet, Rfl: 3   atorvastatin (LIPITOR) 20 MG tablet, Take 1 tablet (20 mg total) by mouth daily.,  Disp: 90 tablet, Rfl: 3   cetirizine (ZYRTEC) 10 MG tablet, Take 10 mg by mouth daily., Disp: , Rfl:    cholecalciferol (VITAMIN D3) 25 MCG (1000 UNIT) tablet, Take 1 tablet (1,000 Units total) by mouth daily., Disp: 90 tablet, Rfl: 3   hydrochlorothiazide (MICROZIDE) 12.5 MG capsule, TAKE ONE CAPSULE EACH DAY,  Disp: 90 capsule, Rfl: 3   ibuprofen (ADVIL) 800 MG tablet, Take 800 mg by mouth every 6 (six) hours as needed., Disp: , Rfl:    metoprolol succinate (TOPROL XL) 25 MG 24 hr tablet, Take 1 tablet (25 mg total) by mouth daily., Disp: 30 tablet, Rfl: 0   olmesartan (BENICAR) 20 MG tablet, Take 1 tablet (20 mg total) by mouth every evening., Disp: 30 tablet, Rfl: 0   Probiotic Product (PRO-BIOTIC BLEND PO), Take 1 tablet by mouth daily., Disp: , Rfl:    zolpidem (AMBIEN) 10 MG tablet, TAKE 1/2 TO 1 TABLET AT BEDTIME, Disp: 90 tablet, Rfl: 0   Multiple Vitamin (MULTIVITAMIN) capsule, Take 1 capsule by mouth daily., Disp: , Rfl:    ondansetron (ZOFRAN) 4 MG tablet, TAKE ONE TABLET EVERY EIGHT HOURS AS NEEDED FOR NAUSEA AND VOMITING -TO PREVENT DIARRHEA MAY USE 2 TABLETS, Disp: 60 tablet, Rfl: 5  Current Facility-Administered Medications:    albuterol (PROVENTIL) (5 MG/ML) 0.5% nebulizer solution 2.5 mg, 2.5 mg, Nebulization, Once, Rita Ohara, MD  Orders Placed This Encounter  Procedures   Basic metabolic panel   Magnesium   EKG 12-Lead   PCV ECHOCARDIOGRAM COMPLETE   --Continue cardiac medications as reconciled in final medication list. --Return in about 1 year (around 01/01/2022) for Follow up mildly reduced LVEF. Or sooner if needed. --Continue follow-up with your primary care physician regarding the management of your other chronic comorbid conditions.  Patient's questions and concerns were addressed to her satisfaction. She voices understanding of the instructions provided during this encounter.   This note was created using a voice recognition software as a result there may be grammatical errors inadvertently enclosed that do not reflect the nature of this encounter. Every attempt is made to correct such errors.  Total time spent: 35 minutes.  Rex Kras, [place], [facility]  Pager: [phone] Office: [phone]

## 2021-01-02 ENCOUNTER — Other Ambulatory Visit: Payer: Self-pay | Admitting: Internal Medicine

## 2021-01-09 ENCOUNTER — Other Ambulatory Visit: Payer: Self-pay

## 2021-01-09 DIAGNOSIS — I10 Essential (primary) hypertension: Secondary | ICD-10-CM

## 2021-01-17 DIAGNOSIS — I10 Essential (primary) hypertension: Secondary | ICD-10-CM | POA: Diagnosis not present

## 2021-01-18 LAB — BASIC METABOLIC PANEL
BUN/Creatinine Ratio: 31 — ABNORMAL HIGH (ref 12–28)
BUN: 24 mg/dL (ref 8–27)
CO2: 26 mmol/L (ref 20–29)
Calcium: 9.8 mg/dL (ref 8.7–10.3)
Chloride: 100 mmol/L (ref 96–106)
Creatinine, Ser: 0.77 mg/dL (ref 0.57–1.00)
Glucose: 136 mg/dL — ABNORMAL HIGH (ref 65–99)
Potassium: 3.8 mmol/L (ref 3.5–5.2)
Sodium: 140 mmol/L (ref 134–144)
eGFR: 83 mL/min/{1.73_m2} (ref >59)

## 2021-01-18 LAB — MAGNESIUM: Magnesium: 2.1 mg/dL (ref 1.6–2.3)

## 2021-01-21 ENCOUNTER — Telehealth (INDEPENDENT_AMBULATORY_CARE_PROVIDER_SITE_OTHER): Payer: PPO | Admitting: Medical

## 2021-01-21 ENCOUNTER — Other Ambulatory Visit: Payer: Self-pay

## 2021-01-21 ENCOUNTER — Encounter: Payer: Self-pay | Admitting: Medical

## 2021-01-21 VITALS — Temp 101.0°F | Wt 171.0 lb

## 2021-01-21 DIAGNOSIS — T50Z95A Adverse effect of other vaccines and biological substances, initial encounter: Secondary | ICD-10-CM

## 2021-01-21 DIAGNOSIS — R509 Fever, unspecified: Secondary | ICD-10-CM | POA: Diagnosis not present

## 2021-01-21 DIAGNOSIS — R059 Cough, unspecified: Secondary | ICD-10-CM | POA: Diagnosis not present

## 2021-01-21 NOTE — Progress Notes (Signed)
Subjective:     Patient ID: Shelby Randolph, female   DOB: [birth year], 70 y.o.   MRN: [id number]  This visit type was conducted due to national recommendations for restrictions regarding the COVID-19 Pandemic (e.g. social distancing) in an effort to limit this patient's exposure and mitigate transmission in our community.  Due to their co-morbid illnesses, this patient is at least at moderate risk for complications without adequate follow up.  This format is felt to be most appropriate for this patient at this time.    Documentation for virtual audio and video telecommunications through [place] encounter:  The patient was located at home. The provider was located in the office. The patient did consent to this visit and is aware of possible charges through their insurance for this visit.  The other persons participating in this telemedicine service were none. Time spent on call was 20 minutes and in review of previous records 20 minutes total.  This virtual service is not related to other E/M service within previous 7 days.   HPI Chief Complaint  Patient presents with   sick    Sick- no feeling well. [facility]- fever, cough, runny nose, congestion. Had 2 negative covid tests.   Virtual consult for illness.  This past Friday, about 5 days ago she got a flu shot and the new COVID-vaccine.  She got them at the same time at the pharmacy.  Within the last 2 days she started feeling bad.  She had 2 negative COVID test on 2 separate days just been today.  Her symptoms include headache, fever, body aches, chills, headache, just overall malaise.  No particular sick contacts with COVID but she does work in the emergency department.  Fever last night to 101.  No loss of smell or taste.  Has mild shortness of breath.  Using nothing for symptoms.  No other aggravating or relieving factors. No other complaint.  Past Medical History:  Diagnosis Date   Abnormal screening  cardiac CT 11/2011   coronary calcium score 13, 24% percentile, mild plaque in prox LAD and ostial RCA   Adenomatous colon polyp    Dr. Silvano Rusk   Allergy    Anemia    Anxiety    Arthritis    Asthma    Atherosclerosis of aorta ([place]) 03/2014   abnormal on ultrasound, repeat US within 5 years   Cancer [facility])    Skin cancer squamus    Depression    Diverticulosis    Eye injury    in college, some pertinent loss of vision in left eye   Genital herpes    GERD (gastroesophageal reflux disease)    H/O echocardiogram 10/28/2018   LVEF 45-50%, impaired diastolic dysfunction, mild mitral and tricuspid regurgitation; Dr. Virgina Jock   History of bone density study 09/2011   normal   History of mammogram    Dr. Radene Knee   History of melanoma    sees Lavonna Monarch, dermatology   History of PFTs 01/20/2011   moderate airflow limitation, no significant response to bronchodilator   Hyperlipidemia    Hypertension 12/2011   Insomnia    Migraines    Dr. Melton Alar, migraines   MSH6-related Lynch syndrome (HNPCC5) 08/20/2017   Nearsightedness    wears glasses   Osteoporosis    Post-operative nausea and vomiting    Routine gynecological examination    Dr. Sharene Butters virus disease 06/2014   Current Outpatient Medications on File Prior to  Visit  Medication Sig Dispense Refill   acetaminophen (TYLENOL) 500 MG tablet 1-2 tablets twice to three times daily as needed. 100 tablet 5   acyclovir (ZOVIRAX) 400 MG tablet TAKE ONE TABLET TWICE DAILY FOR SUPPRESSION OR 3 TIMES DAILY FOR 5 DAYS FOR FLARE-UP 90 tablet 3   albuterol (VENTOLIN HFA) 108 (90 Base) MCG/ACT inhaler Inhale 2 puffs into the lungs every 4 (four) hours as needed for wheezing or shortness of breath. 18 g 2   alosetron (LOTRONEX) 1 MG tablet Take 1 tablet (1 mg total) by mouth 2 (two) times daily. 60 tablet 11   aspirin EC 81 MG tablet Take 1 tablet (81 mg total) by mouth daily. 90 tablet 3   atorvastatin (LIPITOR) 20 MG tablet Take 1  tablet (20 mg total) by mouth daily. 90 tablet 3   cetirizine (ZYRTEC) 10 MG tablet Take 10 mg by mouth daily.     cholecalciferol (VITAMIN D3) 25 MCG (1000 UNIT) tablet Take 1 tablet (1,000 Units total) by mouth daily. 90 tablet 3   hydrochlorothiazide (MICROZIDE) 12.5 MG capsule TAKE ONE CAPSULE EACH DAY 90 capsule 3   ibuprofen (ADVIL) 800 MG tablet Take 800 mg by mouth every 6 (six) hours as needed.     metoprolol succinate (TOPROL XL) 25 MG 24 hr tablet Take 1 tablet (25 mg total) by mouth daily. 30 tablet 0   Multiple Vitamin (MULTIVITAMIN) capsule Take 1 capsule by mouth daily.     olmesartan (BENICAR) 20 MG tablet Take 1 tablet (20 mg total) by mouth every evening. 30 tablet 0   Probiotic Product (PRO-BIOTIC BLEND PO) Take 1 tablet by mouth daily.     zolpidem (AMBIEN) 10 MG tablet TAKE 1/2 TO 1 TABLET AT BEDTIME 90 tablet 0   ondansetron (ZOFRAN) 4 MG tablet TAKE ONE TABLET EVERY EIGHT HOURS AS NEEDED FOR NAUSEA AND VOMITING -TO PREVENT DIARRHEA MAY USE 2 TABLETS (Patient not taking: Reported on 01/21/2021) 60 tablet 5   No current facility-administered medications on file prior to visit.     Review of Systems As in subjective    Objective:   Physical Exam Due to coronavirus pandemic stay at home measures, patient visit was virtual and they were not examined in person.   Temp (!) 101 F (38.3 C)   Wt 171 lb (77.6 kg)   BMI 26.00 kg/m   General: Somewhat ill-appearing, congested sounding.  Otherwise answers questions in complete sentences, no wheezing here labored breathing    Assessment:     Encounter Diagnoses  Name Primary?   Fever, unspecified fever cause Yes   Cough    Adverse effect of vaccine, initial encounter         Plan:     We discussed symptoms and concerns.  I gave her the option going for chest x-ray and coming in here in our back parking lot for COVID PCR test.  Now she declines.  Advised rest, hydration, can use Tylenol over-the-counter for aches  and pains, begin emergenC immune plus vitamin pack.  Differential could include viral illness, pneumonia, vaccine reaction.  Given the timing of the symptoms shortly after getting the new COVID-vaccine and flu shot, and having symptoms within a few days I suspect this is a vaccine reaction or adverse effect.  Advised if worse or other new symptoms in the next 2 to 3 days then call back or recheck.  Camara was seen today for sick.  Diagnoses and all orders for this visit:  Fever, unspecified fever cause  Cough  Adverse effect of vaccine, initial encounter  F/u within a few days

## 2021-01-23 ENCOUNTER — Telehealth: Payer: Self-pay | Admitting: Medical

## 2021-01-23 NOTE — Telephone Encounter (Signed)
Please call her pharmacy and have them file a VAERS vaccine adverse event report for the new COVID-vaccine that she got at a local pharmacy.  She has flulike symptoms that began within 2 or 3 days of the just received last week.  Her symptoms include fever, body aches, chills, headache, congestion, sore throat.

## 2021-01-24 MED ORDER — BENZONATATE 100 MG PO CAPS: 200.0000 mg | ORAL_CAPSULE | Freq: Three times a day (TID) | ORAL | 0 refills | Status: DC | PRN

## 2021-01-24 NOTE — Telephone Encounter (Signed)
Pt is coughing really bad and having some sob. She had some hydrocodone cough syrup but states she can not take that and needs something else for her cough. Please send in to [facility] and notify pt when done

## 2021-01-24 NOTE — Telephone Encounter (Signed)
Tried to call pt but she did not answer. I sent a mychart message

## 2021-01-24 NOTE — Addendum Note (Signed)
Addended by: Minette Headland A on: 01/24/2021 12:21 PM   Modules accepted: Orders

## 2021-01-24 NOTE — Telephone Encounter (Signed)
[facility] and had them file reaction report for covid vaccine

## 2021-01-27 ENCOUNTER — Other Ambulatory Visit: Payer: Self-pay | Admitting: Medical

## 2021-01-27 ENCOUNTER — Telehealth: Payer: Self-pay | Admitting: Medical

## 2021-01-27 DIAGNOSIS — R059 Cough, unspecified: Secondary | ICD-10-CM

## 2021-01-27 MED ORDER — CLARITHROMYCIN 500 MG PO TABS: 500.0000 mg | ORAL_TABLET | Freq: Two times a day (BID) | ORAL | 0 refills | Status: DC

## 2021-01-27 MED ORDER — PROMETHAZINE-DM 6.25-15 MG/5ML PO SYRP: 5.0000 mL | ORAL_SOLUTION | Freq: Four times a day (QID) | ORAL | 0 refills | Status: DC | PRN

## 2021-01-27 NOTE — Telephone Encounter (Signed)
Left detailed message for pt about meds sent to pharmacy

## 2021-01-27 NOTE — Telephone Encounter (Signed)
Pt called and states that she is not any better. She states last week she was advised by Audelia Acton to call if no better and he would order a xray. She is requesting that be done, pt can be reached at [phone].

## 2021-01-27 NOTE — Telephone Encounter (Signed)
Pt was notified to go for xray. Pt has cough, no fever and congestion. Cough med like hydrocodone and the perles just make her so sleepy. She think she needs antibiotic. She will go for xray later on today for it

## 2021-01-28 ENCOUNTER — Other Ambulatory Visit: Payer: Self-pay

## 2021-01-28 ENCOUNTER — Ambulatory Visit (HOSPITAL_COMMUNITY)
Admission: RE | Admit: 2021-01-28 | Discharge: 2021-01-28 | Disposition: A | Discharge status: Home / Self Care | Payer: PPO | Source: Ambulatory Visit | Attending: Medical | Admitting: Medical

## 2021-01-28 DIAGNOSIS — R059 Cough, unspecified: Secondary | ICD-10-CM | POA: Diagnosis not present

## 2021-01-28 DIAGNOSIS — R0602 Shortness of breath: Secondary | ICD-10-CM | POA: Diagnosis not present

## 2021-02-04 ENCOUNTER — Other Ambulatory Visit: Payer: Self-pay | Admitting: Cardiology

## 2021-02-04 ENCOUNTER — Telehealth: Payer: Self-pay

## 2021-02-04 DIAGNOSIS — I10 Essential (primary) hypertension: Secondary | ICD-10-CM

## 2021-02-04 NOTE — Telephone Encounter (Signed)
Pt. Called stating that she has poison ivy and she wanted to know if you could pend the rules and call something in for her. I told her she would need an apt and I got her scheduled Thursday with Dr. Tomi Bamberger.

## 2021-02-05 ENCOUNTER — Other Ambulatory Visit: Payer: Self-pay | Admitting: Medical

## 2021-02-05 MED ORDER — PREDNISONE 10 MG PO TABS: ORAL_TABLET | ORAL | 0 refills | Status: DC

## 2021-02-05 MED ORDER — TRIAMCINOLONE ACETONIDE 0.1 % EX CREA: 1.0000 "application " | TOPICAL_CREAM | Freq: Two times a day (BID) | CUTANEOUS | 0 refills | Status: DC

## 2021-02-06 ENCOUNTER — Ambulatory Visit: Payer: PPO | Admitting: Family Medicine

## 2021-02-09 ENCOUNTER — Other Ambulatory Visit: Payer: Self-pay | Admitting: Medical

## 2021-02-11 DIAGNOSIS — L570 Actinic keratosis: Secondary | ICD-10-CM | POA: Diagnosis not present

## 2021-02-11 DIAGNOSIS — C44529 Squamous cell carcinoma of skin of other part of trunk: Secondary | ICD-10-CM | POA: Diagnosis not present

## 2021-02-11 DIAGNOSIS — D225 Melanocytic nevi of trunk: Secondary | ICD-10-CM | POA: Diagnosis not present

## 2021-02-11 DIAGNOSIS — L814 Other melanin hyperpigmentation: Secondary | ICD-10-CM | POA: Diagnosis not present

## 2021-02-11 DIAGNOSIS — D485 Neoplasm of uncertain behavior of skin: Secondary | ICD-10-CM | POA: Diagnosis not present

## 2021-02-11 DIAGNOSIS — Z85828 Personal history of other malignant neoplasm of skin: Secondary | ICD-10-CM | POA: Diagnosis not present

## 2021-02-11 DIAGNOSIS — L821 Other seborrheic keratosis: Secondary | ICD-10-CM | POA: Diagnosis not present

## 2021-02-11 DIAGNOSIS — L578 Other skin changes due to chronic exposure to nonionizing radiation: Secondary | ICD-10-CM | POA: Diagnosis not present

## 2021-02-11 DIAGNOSIS — Z23 Encounter for immunization: Secondary | ICD-10-CM | POA: Diagnosis not present

## 2021-02-11 DIAGNOSIS — L237 Allergic contact dermatitis due to plants, except food: Secondary | ICD-10-CM | POA: Diagnosis not present

## 2021-02-18 ENCOUNTER — Encounter: Payer: Self-pay | Admitting: Internal Medicine

## 2021-03-08 ENCOUNTER — Other Ambulatory Visit: Payer: Self-pay | Admitting: Cardiology

## 2021-03-08 DIAGNOSIS — I10 Essential (primary) hypertension: Secondary | ICD-10-CM

## 2021-03-13 ENCOUNTER — Other Ambulatory Visit: Payer: Self-pay | Admitting: Dermatology

## 2021-03-13 DIAGNOSIS — C44529 Squamous cell carcinoma of skin of other part of trunk: Secondary | ICD-10-CM | POA: Diagnosis not present

## 2021-03-14 ENCOUNTER — Encounter: Payer: Self-pay | Admitting: Medical

## 2021-03-14 LAB — DERMATOPATHOLOGY REPORT

## 2021-03-14 LAB — DERMPATH, SPECIMEN A

## 2021-03-15 ENCOUNTER — Other Ambulatory Visit: Payer: Self-pay | Admitting: Internal Medicine

## 2021-03-17 ENCOUNTER — Other Ambulatory Visit: Payer: Self-pay | Admitting: Medical

## 2021-04-12 ENCOUNTER — Other Ambulatory Visit: Payer: Self-pay | Admitting: Cardiology

## 2021-04-12 DIAGNOSIS — I10 Essential (primary) hypertension: Secondary | ICD-10-CM

## 2021-04-14 ENCOUNTER — Other Ambulatory Visit: Payer: Self-pay | Admitting: Cardiology

## 2021-04-14 DIAGNOSIS — I10 Essential (primary) hypertension: Secondary | ICD-10-CM

## 2021-05-11 HISTORY — PX: COLONOSCOPY: SHX174

## 2021-05-12 ENCOUNTER — Other Ambulatory Visit: Payer: Self-pay | Admitting: Cardiology

## 2021-05-12 DIAGNOSIS — I10 Essential (primary) hypertension: Secondary | ICD-10-CM

## 2021-05-20 ENCOUNTER — Other Ambulatory Visit: Payer: Self-pay | Admitting: Cardiology

## 2021-05-20 DIAGNOSIS — I10 Essential (primary) hypertension: Secondary | ICD-10-CM

## 2021-05-28 DIAGNOSIS — M19111 Post-traumatic osteoarthritis, right shoulder: Secondary | ICD-10-CM | POA: Diagnosis not present

## 2021-05-28 DIAGNOSIS — M25511 Pain in right shoulder: Secondary | ICD-10-CM | POA: Diagnosis not present

## 2021-06-10 ENCOUNTER — Telehealth: Payer: Self-pay

## 2021-06-10 NOTE — Telephone Encounter (Signed)
Pt called and stated that the Olmesartan has been giving her extremely terrible nightmares and crazy dreans. This started about 2- 3 weeks ago. Pt has not taken the olmesartan in about a week and has not have any crazy dreams. Pt is unsure of what she should do. Please advise.

## 2021-06-11 ENCOUNTER — Other Ambulatory Visit: Payer: Self-pay | Admitting: Medical

## 2021-06-11 NOTE — Telephone Encounter (Signed)
Has upcoming appt °

## 2021-06-12 DIAGNOSIS — M25511 Pain in right shoulder: Secondary | ICD-10-CM | POA: Diagnosis not present

## 2021-06-12 NOTE — Telephone Encounter (Signed)
Called pt, no answer. Left vm requesting call back?

## 2021-06-12 NOTE — Telephone Encounter (Signed)
Olmesartan should not cause terrible nightmares or crazy dreams. However, if the symptoms have been resolved upon discontinuation of olmesartan have her hold it for now. Would advise her to keep a log of her blood pressures to see if additional medication is warranted.  Oris Staffieri [place], DO, [facility]

## 2021-06-13 NOTE — Telephone Encounter (Signed)
Called and spoke to pt, pt voiced understanding and agreed to keep a long of her BP. Pt will call us if her BP is abnormal.

## 2021-06-18 ENCOUNTER — Other Ambulatory Visit: Payer: Self-pay | Admitting: Medical

## 2021-06-18 DIAGNOSIS — M25511 Pain in right shoulder: Secondary | ICD-10-CM | POA: Diagnosis not present

## 2021-06-20 DIAGNOSIS — M25511 Pain in right shoulder: Secondary | ICD-10-CM | POA: Diagnosis not present

## 2021-06-25 ENCOUNTER — Encounter: Payer: Self-pay | Admitting: Medical

## 2021-06-25 ENCOUNTER — Telehealth (INDEPENDENT_AMBULATORY_CARE_PROVIDER_SITE_OTHER): Payer: PPO | Admitting: Medical

## 2021-06-25 VITALS — Wt 165.0 lb

## 2021-06-25 DIAGNOSIS — Z20818 Contact with and (suspected) exposure to other bacterial communicable diseases: Secondary | ICD-10-CM

## 2021-06-25 DIAGNOSIS — J029 Acute pharyngitis, unspecified: Secondary | ICD-10-CM

## 2021-06-25 DIAGNOSIS — R519 Headache, unspecified: Secondary | ICD-10-CM | POA: Diagnosis not present

## 2021-06-25 MED ORDER — AMOXICILLIN 500 MG PO TABS: 500.0000 mg | ORAL_TABLET | Freq: Three times a day (TID) | ORAL | 0 refills | Status: DC

## 2021-06-25 NOTE — Progress Notes (Signed)
Subjective:     Patient ID: Shelby Randolph, female   DOB: 03-03-1951, 71 y.o.   MRN: [id number]  This visit type was conducted due to national recommendations for restrictions regarding the COVID-19 Pandemic (e.g. social distancing) in an effort to limit this patient's exposure and mitigate transmission in our community.  Due to their co-morbid illnesses, this patient is at least at moderate risk for complications without adequate follow up.  This format is felt to be most appropriate for this patient at this time.    Documentation for virtual audio and video telecommunications through [facility] encounter:  The patient was located at home. The provider was located in the office. The patient did consent to this visit and is aware of possible charges through their insurance for this visit.  The other persons participating in this telemedicine service were none. Time spent on call was 20 minutes and in review of previous records 20 minutes total.  This virtual service is not related to other E/M service within previous 7 days.   HPI Chief Complaint  Patient presents with   Sore Throat    Sore throat, Headache and SOB, symptoms started Saturday. Grandchild had strep when she was around him over the weekend. Tested negative for covid today   Virtual consult for illness.  Symptoms started 4 days ago and have included sore throat, headache, some shortness of breath, not feeling well in general.  No fever, no body aches or chills.  No nausea vomiting or diarrhea.  Was around grandson who was sick recently.  He was + for strep at urgent care.  Using Tylenol for current symptoms.  She also notes that cardiology started her on olmesartan in recent months because she felt like she was getting night terrors on this.  She stopped it after calling the cardiology office.  She is not sure if she needs to be on something else.  She has had some stressors recently.  Her dog actually bit her neighbors  dog.  She had some issues where police had to come out to her house related to this incident but after the fact.  She is still working part-time.  Trying to get through the stressors.  Her daughter's partner who has 3 children is having a rough time with the daughter who is in middle school and has bad anxiety.   Past Medical History:  Diagnosis Date   Abnormal screening cardiac CT 11/2011   coronary calcium score 13, 24% percentile, mild plaque in prox LAD and ostial RCA   Adenomatous colon polyp    Dr. Silvano Rusk   Allergy    Anemia    Anxiety    Arthritis    Asthma    Atherosclerosis of aorta ([place]) 03/2014   abnormal on ultrasound, repeat US within 5 years   Cancer [facility])    Skin cancer squamus    Depression    Diverticulosis    Eye injury    in college, some pertinent loss of vision in left eye   Genital herpes    GERD (gastroesophageal reflux disease)    H/O echocardiogram 10/28/2018   LVEF 45-50%, impaired diastolic dysfunction, mild mitral and tricuspid regurgitation; Dr. Virgina Jock   History of bone density study 09/2011   normal   History of mammogram    Dr. Radene Knee   History of melanoma    sees Lavonna Monarch, dermatology   History of PFTs 01/20/2011   moderate airflow limitation, no significant response to bronchodilator  Hyperlipidemia    Hypertension 12/2011   Insomnia    Migraines    Dr. Melton Alar, migraines   MSH6-related Lynch syndrome [facility]) 08/20/2017   Nearsightedness    wears glasses   Osteoporosis    Post-operative nausea and vomiting    Routine gynecological examination    Dr. Sharene Butters virus disease 06/2014     Current Outpatient Medications on File Prior to Visit  Medication Sig Dispense Refill   acetaminophen (TYLENOL) 500 MG tablet 1-2 tablets twice to three times daily as needed. 100 tablet 5   acyclovir (ZOVIRAX) 400 MG tablet TAKE ONE TABLET TWICE DAILY FOR SUPPRESSION OR 3 TIMES DAILY FOR 5 DAYS FOR FLARE-UP 90 tablet 3   albuterol  (VENTOLIN HFA) 108 (90 Base) MCG/ACT inhaler Inhale 2 puffs into the lungs every 4 (four) hours as needed for wheezing or shortness of breath. 18 g 2   alosetron (LOTRONEX) 0.5 MG tablet TAKE TWO TABLETS TWICE DAILY 120 tablet 1   alosetron (LOTRONEX) 1 MG tablet Take 1 tablet (1 mg total) by mouth 2 (two) times daily. 60 tablet 11   aspirin EC 81 MG tablet Take 1 tablet (81 mg total) by mouth daily. 90 tablet 3   atorvastatin (LIPITOR) 20 MG tablet TAKE ONE TABLET BY MOUTH ONCE DAILY 30 tablet 0   benzonatate (TESSALON) 100 MG capsule Take 2 capsules (200 mg total) by mouth 3 (three) times daily as needed for cough. 20 capsule 0   cetirizine (ZYRTEC) 10 MG tablet Take 10 mg by mouth daily.     cholecalciferol (VITAMIN D3) 25 MCG (1000 UNIT) tablet Take 1 tablet (1,000 Units total) by mouth daily. 90 tablet 3   hydrochlorothiazide (MICROZIDE) 12.5 MG capsule TAKE ONE CAPSULE EACH DAY 90 capsule 1   ibuprofen (ADVIL) 800 MG tablet Take 800 mg by mouth every 6 (six) hours as needed.     metoprolol succinate (TOPROL-XL) 25 MG 24 hr tablet TAKE ONE TABLET BY MOUTH ONCE DAILY 90 tablet 1   Multiple Vitamin (MULTIVITAMIN) capsule Take 1 capsule by mouth daily.     olmesartan (BENICAR) 20 MG tablet TAKE ONE TABLET EVERY EVENING 30 tablet 0   ondansetron (ZOFRAN) 4 MG tablet TAKE ONE TABLET EVERY EIGHT HOURS AS NEEDED FOR NAUSEA AND VOMITING -TO PREVENT DIARRHEA MAY USE 2 TABLETS 60 tablet 5   Probiotic Product (PRO-BIOTIC BLEND PO) Take 1 tablet by mouth daily.     zolpidem (AMBIEN) 10 MG tablet TAKE 1/2 TO 1 TABLET AT BEDTIME 90 tablet 0   No current facility-administered medications on file prior to visit.    Review of Systems As in subjective    Objective:   Physical Exam Due to coronavirus pandemic stay at home measures, patient visit was virtual and they were not examined in person.   Wt 165 lb (74.8 kg)    BMI 25.09 kg/m   Gen: wd, wn, nad No obvious labored breathing or wheezing Psych:  pleasant, answers questions appropriately      Assessment:     Encounter Diagnoses  Name Primary?   Streptococcus exposure Yes   Sore throat    Nonintractable headache, unspecified chronicity pattern, unspecified headache type        Plan:     Advise rest, hydration, salt water gargles, warm fluids, and given strep exposure and current symptoms begin amoxicillin as below.  We discussed her stressors and other concerns.  I will see her back in March for a physical.  We can recheck her blood pressure at that point and help determine if she needs to go back on a different ACE or ARB.  She was not tolerating olmesartan.    Lameka was seen today for sore throat.  Diagnoses and all orders for this visit:  Streptococcus exposure  Sore throat  Nonintractable headache, unspecified chronicity pattern, unspecified headache type  Other orders -     amoxicillin (AMOXIL) 500 MG tablet; Take 1 tablet (500 mg total) by mouth in the morning, at noon, and at bedtime.  F/u prn

## 2021-06-27 ENCOUNTER — Telehealth: Payer: Self-pay

## 2021-06-27 NOTE — Telephone Encounter (Signed)
I received a fax that [employer] needed more info about a tiering exception for patients alosetron tablet. The form was filled out and faxed to [phone]. Will await their decision.

## 2021-07-02 DIAGNOSIS — Z1151 Encounter for screening for human papillomavirus (HPV): Secondary | ICD-10-CM | POA: Diagnosis not present

## 2021-07-02 DIAGNOSIS — Z1231 Encounter for screening mammogram for malignant neoplasm of breast: Secondary | ICD-10-CM | POA: Diagnosis not present

## 2021-07-02 DIAGNOSIS — Z124 Encounter for screening for malignant neoplasm of cervix: Secondary | ICD-10-CM | POA: Diagnosis not present

## 2021-07-02 DIAGNOSIS — Z1272 Encounter for screening for malignant neoplasm of vagina: Secondary | ICD-10-CM | POA: Diagnosis not present

## 2021-07-02 DIAGNOSIS — Z6827 Body mass index (BMI) 27.0-27.9, adult: Secondary | ICD-10-CM | POA: Diagnosis not present

## 2021-07-02 LAB — HM MAMMOGRAPHY

## 2021-07-03 LAB — HM PAP SMEAR: HM Pap smear: NEGATIVE

## 2021-07-03 LAB — RESULTS CONSOLE HPV: CHL HPV: NEGATIVE

## 2021-07-03 NOTE — Telephone Encounter (Signed)
Form re-faxed. Have not received a response.

## 2021-07-11 ENCOUNTER — Encounter: Payer: Self-pay | Admitting: Internal Medicine

## 2021-07-11 ENCOUNTER — Ambulatory Visit (INDEPENDENT_AMBULATORY_CARE_PROVIDER_SITE_OTHER): Payer: PPO | Admitting: Medical

## 2021-07-11 ENCOUNTER — Encounter: Payer: Self-pay | Admitting: Medical

## 2021-07-11 ENCOUNTER — Other Ambulatory Visit: Payer: Self-pay

## 2021-07-11 VITALS — BP 132/80 | HR 66 | Ht 67.0 in | Wt 173.8 lb

## 2021-07-11 DIAGNOSIS — Z860100 Personal history of colon polyps, unspecified: Secondary | ICD-10-CM

## 2021-07-11 DIAGNOSIS — Z131 Encounter for screening for diabetes mellitus: Secondary | ICD-10-CM

## 2021-07-11 DIAGNOSIS — B001 Herpesviral vesicular dermatitis: Secondary | ICD-10-CM

## 2021-07-11 DIAGNOSIS — E559 Vitamin D deficiency, unspecified: Secondary | ICD-10-CM

## 2021-07-11 DIAGNOSIS — I8393 Asymptomatic varicose veins of bilateral lower extremities: Secondary | ICD-10-CM | POA: Diagnosis not present

## 2021-07-11 DIAGNOSIS — I10 Essential (primary) hypertension: Secondary | ICD-10-CM

## 2021-07-11 DIAGNOSIS — E2839 Other primary ovarian failure: Secondary | ICD-10-CM | POA: Diagnosis not present

## 2021-07-11 DIAGNOSIS — Z1509 Genetic susceptibility to other malignant neoplasm: Secondary | ICD-10-CM

## 2021-07-11 DIAGNOSIS — E785 Hyperlipidemia, unspecified: Secondary | ICD-10-CM

## 2021-07-11 DIAGNOSIS — Z780 Asymptomatic menopausal state: Secondary | ICD-10-CM

## 2021-07-11 DIAGNOSIS — Z0000 Encounter for general adult medical examination without abnormal findings: Secondary | ICD-10-CM | POA: Diagnosis not present

## 2021-07-11 DIAGNOSIS — K580 Irritable bowel syndrome with diarrhea: Secondary | ICD-10-CM

## 2021-07-11 DIAGNOSIS — F411 Generalized anxiety disorder: Secondary | ICD-10-CM | POA: Diagnosis not present

## 2021-07-11 DIAGNOSIS — M24541 Contracture, right hand: Secondary | ICD-10-CM

## 2021-07-11 DIAGNOSIS — M8580 Other specified disorders of bone density and structure, unspecified site: Secondary | ICD-10-CM | POA: Diagnosis not present

## 2021-07-11 DIAGNOSIS — J4520 Mild intermittent asthma, uncomplicated: Secondary | ICD-10-CM

## 2021-07-11 DIAGNOSIS — I700 Atherosclerosis of aorta: Secondary | ICD-10-CM

## 2021-07-11 NOTE — Patient Instructions (Signed)
A personalized plan was printed today for your records and use.   Personalized health advice and education was given today to reduce health risks and promote self management and wellness.  Information regarding end of life planning was discussed today. ? ? ?Recommendations: ?Continue to return yearly for your annual wellness and preventative care visits.  This gives [place] a chance to discuss healthy lifestyle, exercise, vaccinations, review your chart record, and perform screenings where appropriate. ? ?I recommend you see your eye doctor yearly for routine vision care. ? ?I recommend you see your dentist yearly for routine dental care including hygiene visits twice yearly. ? ?See your gynecologist yearly for routine gynecological care. ? ? ?Vaccination recommendations were reviewed ?Immunization History  ?Administered Date(s) Administered  ? Fluad Quad(high Dose 65+) 02/24/2020  ? Hepatitis A 12/09/2017  ? Hepatitis A, Adult 10/09/2017  ? Hepatitis B 10/09/2017, 12/09/2017  ? Influenza Split 02/16/2011  ? Influenza Whole 02/21/2013  ? Influenza-Unspecified 02/20/2014, 02/20/2015, 01/26/2017, 02/08/2018, 02/17/2019  ? Meningococcal Conjugate 06/12/2017  ? PFIZER(Purple Top)SARS-COV-2 Vaccination 06/15/2019, 07/10/2019, 01/26/2020  ? Pneumococcal Conjugate-13 05/23/2015  ? Pneumococcal Polysaccharide-23 02/03/2012, 07/07/2018  ? Pneumococcal-Unspecified 06/12/2017  ? Td 07/18/2007  ? Tdap 01/16/2011, 05/11/2017  ? Varicella 04/10/2012  ? Zoster Recombinat (Shingrix) 12/15/2017, 02/13/2018  ? Zoster, Live 03/18/2011, 12/09/2017  ? ? ? ?Screening for cancer: ?Breast cancer screening: ?You should perform a self breast exam monthly.   ?We reviewed recommendations for regular mammograms and breast cancer screening. ? ?Colon cancer screening:  ?I reviewed your colonoscopy on file that is up to date from February 2022.  Due for repeat colonoscopy at this time.   Follow up with gastroenterology. ? ?Cervical cancer  screening: ?We reviewed recommendations for pap smear screening.  Last Pap smear from 2021 reviewed ? ?Skin cancer screening: ?Check your skin regularly for new changes, growing lesions, or other lesions of concern ?Continue routine follow-up with dermatology ? ?Lung cancer screening: ?If you have a greater than 30 pack year history of tobacco use, then you qualify for lung cancer screening with a chest CT scan ? ?We currently don't have screenings for other cancers besides breast, cervical, colon, and lung cancers.  If you have a strong family history of cancer or have other cancer screening concerns, please let me know.  ? ? ?Bone health: ?Get at least 150 minutes of aerobic exercise weekly ?Get weight bearing exercise at least once weekly ?2020 bone density test showed osteopenia.   You should have a repeat bone density now if you haven't already done one with gynecology in the last year or so. ? ? ?Heart health: ?Get at least 150 minutes of aerobic exercise weekly ?Limit alcohol ?It is important to maintain a healthy blood pressure and healthy cholesterol numbers ? ?She sees cardiology.  I reviewed 2021 nuclear stress test and echo as noted above in exam. ? ? ? ?Separate significant issues discussed: ?Screening for diabetes-hemoglobin A1c today ? ?Consider ABI screen for vascular disease ? ?Aortic atherosclerosis, hyperlipidemia-continue statin, labs today ? ?hypertension - continue current medication ? ?Osteopenia-continue vitamin D supplement, get weightbearing exercise and aerobic activity ? ?Actinic keratosis-continue routine surveillance with dermatology ? ?Varicose veins- consider compression hose.  Continue regular exercise ? ?Insomnia-uses Ambien without any major issues.  Does fine on this.  She is aware of risk versus benefits ? ?Asthma-doing fine on albuterol as needed.   ? ?Allergic rhinitis-continue zyrtec ? ?IBS-on medication, managed by GI ? ?Cold sores, genital herpes -  uses acyclovir  as  needed ? ?Hand contracture - consider hand specialist referral ?

## 2021-07-11 NOTE — Progress Notes (Signed)
Subjective:    Shelby Randolph is a 71 y.o. female who presents for Preventative Services visit and chronic medical problems/med check visit.    Primary Care Provider Salisha Bardsley, Camelia Eng, PA-C here for primary care  Current Health Care Team: Dentist, Dr. Mariel Sleet  Eye doctor, Dr. Leonie Man Dr. Silvano Rusk, gastroenterology Dr. Virgina Jock, Dr. Rex Kras, and Binnie Kand nurse practitioner at [facility] cardiovascular Dr. Renda Rolls at dermatology Dr. Radene Knee, [facility] Dr. Sherre Poot, orthopedics, [facility]  Medical Services you may have received from other than [facility] providers in the past year (date may be approximate) Dermatology, eye doctor and dentist, gyn, ortho  Exercise Current exercise habits: Gym/ health club routine includes swimming, walking on track  and spin class. 3 days per week.  Nutrition/Diet Current diet: in general, a "healthy" diet    Depression Screen Depression screen [facility] 2/9 07/11/2021  Decreased Interest 0  Down, Depressed, Hopeless 0  PHQ - 2 Score 0  Altered sleeping -  Tired, decreased energy -  Change in appetite -  Feeling bad or failure about yourself  -  Trouble concentrating -  Moving slowly or fidgety/restless -  Suicidal thoughts -  PHQ-9 Score -  Difficult doing work/chores -  Some recent data might be hidden    Activities of Daily Living Screen/Functional Status Survey Is the patient deaf or have difficulty hearing?: Yes (needs hearing aids) Does the patient have difficulty seeing, even when wearing glasses/contacts?: No Does the patient have difficulty concentrating, remembering, or making decisions?: No Does the patient have difficulty walking or climbing stairs?: No Does the patient have difficulty dressing or bathing?: No Does the patient have difficulty doing errands alone such as visiting a doctor's office or shopping?: No  Can patient draw a clock face showing 11:00 oclock, yes  Fall Risk Screen Fall Risk   07/11/2021 06/25/2021 01/21/2021 07/10/2020 07/10/2019  Falls in the past year? 0 0 0 0 0  Number falls in past yr: 0 0 0 - -  Injury with Fall? 0 0 0 - -  Comment - - - - -  Risk for fall due to : No Fall Risks No Fall Risks No Fall Risks No Fall Risks -  Follow up Falls evaluation completed Falls evaluation completed Falls evaluation completed Falls evaluation completed -    Gait Assessment: Normal gait observed - yes  Advanced directives Does patient have a [place]? Yes Does patient have a Living Will? Yes  Past Medical History:  Diagnosis Date   Abnormal screening cardiac CT 11/2011   coronary calcium score 13, 24% percentile, mild plaque in prox LAD and ostial RCA   Adenomatous colon polyp    Dr. Silvano Rusk   Allergy    Anemia    Anxiety    Arthritis    Asthma    Atherosclerosis of aorta ([place]) 03/2014   abnormal on ultrasound, repeat US within 5 years   Cancer [facility])    Skin cancer [place]    Depression    Diverticulosis    Eye injury    in college, some pertinent loss of vision in left eye   Genital herpes    GERD (gastroesophageal reflux disease)    H/O echocardiogram 10/28/2018   LVEF 45-50%, impaired diastolic dysfunction, mild mitral and tricuspid regurgitation; Dr. Virgina Jock   History of bone density study 09/2011   normal   History of mammogram    Dr. Radene Knee   History of melanoma  sees Lavonna Monarch, dermatology   History of PFTs 01/20/2011   moderate airflow limitation, no significant response to bronchodilator   Hyperlipidemia    Hypertension 12/2011   Insomnia    Migraines    Dr. Melton Alar, migraines   MSH6-related Lynch syndrome (HNPCC5) 08/20/2017   Nearsightedness    wears glasses   Osteoporosis    Post-operative nausea and vomiting    Routine gynecological examination    Dr. Sharene Butters virus disease 06/2014    Past Surgical History:  Procedure Laterality Date   COLONOSCOPY  2016   Dr. Silvano Rusk, 2014 Dr. Earlean Shawl    COLONOSCOPY  01/2019   weak anal sphincter tone, moderate rectocele, polyp, scattered diverticula; Dr. Silvano Rusk   ESOPHAGOGASTRODUODENOSCOPY  11/2017   normal, Dr. Silvano Rusk   ROTATOR CUFF REPAIR Right 05/2010   TONSILLECTOMY AND ADENOIDECTOMY     as a kid   [place]   total; abnormal peroids   VARICOSE VEIN SURGERY     bilat    Social History   Socioeconomic History   Marital status: Widowed    Spouse name: Not on file   Number of children: 5   Years of education: Not on file   Highest education level: Not on file  Occupational History   Occupation: compliance agent (RN)    Employer: [employer]    Comment: Nurse Practitioner  Tobacco Use   Smoking status: Never   Smokeless tobacco: Never  Vaping Use   Vaping Use: Never used  Substance and Sexual Activity   Alcohol use: Yes    Alcohol/week: 2.0 standard drinks    Types: 2 Glasses of wine per week    Comment: occa   Drug use: No   Sexual activity: Not on file    Comment: exercise - walks, [place], cardio at gym 3 x/wk, yoga, widowed   Other Topics Concern   Not on file  Social History Narrative   Lives alone, has 5 children (Stephan, Adin, Cunningham), has several grandchildren, daughter Junie Panning in [place] in the [employer], exercises 5 days per week - walking, kickboxing, yoga; [employer];  Works for [employer] in [employer]   Social Determinants of Health   Financial Resource Strain: Not on file  Food Insecurity: Not on file  Transportation Needs: Not on file  Physical Activity: Not on file  Stress: Not on file  Social Connections: Not on file  Intimate Partner Violence: Not on file    Family History  Problem Relation Age of Onset   COPD Mother        died age 73yo.  emphysema   Colon cancer Father 30        died age 45yo   Colon cancer Brother 48   Breast cancer Maternal Aunt    Colon cancer Paternal Uncle        2    Breast cancer Daughter    Colon cancer Paternal Grandmother 64       colon cancer    Diabetes Neg Hx    Heart disease Neg Hx    Hypertension Neg Hx    Stroke Neg Hx    Rectal cancer Neg Hx    Stomach cancer Neg Hx      Current Outpatient Medications:    acetaminophen (TYLENOL) 500 MG tablet, 1-2 tablets twice to three times daily as needed., Disp: 100 tablet, Rfl: 5  acyclovir (ZOVIRAX) 400 MG tablet, TAKE ONE TABLET TWICE DAILY FOR SUPPRESSION OR 3 TIMES DAILY FOR 5 DAYS FOR FLARE-UP, Disp: 90 tablet, Rfl: 3   albuterol (VENTOLIN HFA) 108 (90 Base) MCG/ACT inhaler, Inhale 2 puffs into the lungs every 4 (four) hours as needed for wheezing or shortness of breath., Disp: 18 g, Rfl: 2   alosetron (LOTRONEX) 1 MG tablet, Take 1 tablet (1 mg total) by mouth 2 (two) times daily., Disp: 60 tablet, Rfl: 11   aspirin EC 81 MG tablet, Take 1 tablet (81 mg total) by mouth daily., Disp: 90 tablet, Rfl: 3   atorvastatin (LIPITOR) 20 MG tablet, TAKE ONE TABLET BY MOUTH ONCE DAILY, Disp: 30 tablet, Rfl: 0   cetirizine (ZYRTEC) 10 MG tablet, Take 10 mg by mouth daily., Disp: , Rfl:    cholecalciferol (VITAMIN D3) 25 MCG (1000 UNIT) tablet, Take 1 tablet (1,000 Units total) by mouth daily., Disp: 90 tablet, Rfl: 3   hydrochlorothiazide (MICROZIDE) 12.5 MG capsule, TAKE ONE CAPSULE EACH DAY, Disp: 90 capsule, Rfl: 1   ibuprofen (ADVIL) 800 MG tablet, Take 800 mg by mouth every 6 (six) hours as needed., Disp: , Rfl:    Multiple Vitamin (MULTIVITAMIN) capsule, Take 1 capsule by mouth daily., Disp: , Rfl:    olmesartan (BENICAR) 20 MG tablet, TAKE ONE TABLET EVERY EVENING, Disp: 30 tablet, Rfl: 0   ondansetron (ZOFRAN) 4 MG tablet, TAKE ONE TABLET EVERY EIGHT HOURS AS NEEDED FOR NAUSEA AND VOMITING -TO PREVENT DIARRHEA MAY USE 2 TABLETS, Disp: 60 tablet, Rfl: 5   Probiotic Product (PRO-BIOTIC BLEND PO), Take 1 tablet by mouth daily., Disp: , Rfl:    zolpidem (AMBIEN) 10 MG tablet, TAKE 1/2 TO 1 TABLET AT  BEDTIME, Disp: 90 tablet, Rfl: 0  Allergies  Allergen Reactions   Sulfonamide Derivatives Shortness Of Breath and Swelling   Mucinex [Guaifenesin Er] Other (See Comments)    "Jittery and hypes me up"    History reviewed: allergies, current medications, past family history, past medical history, past social history, past surgical history and problem list  Chronic issues discussed: Asthma-no recent concerns or problems  Only recent issues right shoulder issues partial tear of rotator cuff.  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 Yes  Normal Appearance?Yes  Oriented to person? Yes  Place? Yes   Time? Yes  Recall of three objects?  Yes  Can perform simple calculations? Yes  Displays appropriate judgment?Yes  Can read the correct time from a watch face?Yes  General appearance: alert, no distress, WD/WN, white female  Nutritional Status: Inadequate calore intake? no Loss of muscle mass? no Loss of fat beneath skin? no Localized or general edema? no Diminished functional status? no  Other pertinent exam: HEENT: normocephalic, sclerae  anicteric Neck: supple, no lymphadenopathy, no thyromegaly, no masses, no bruits Heart: RRR, normal S1, S2, no murmurs Lungs: CTA bilaterally, no wheezes, rhonchi, or rales Abdomen: +bs, soft, non tender, non distended, no masses, no hepatomegaly, no splenomegaly Musculoskeletal: +contracture, mild of right hand volar surface affecting 3rd and 4th digits along mid to distal metacarpals, no flexion deformity, otherwise UE and LE nontender, no swelling, no obvious deformity Extremities: moderate varicose veins bilat LE, no edema, no cyanosis, no clubbing Pulses: 2+ symmetric, upper and lower extremities, normal cap refill Neurological: alert, oriented x 3, CN2-12 intact, strength normal upper extremities and lower extremities, sensation normal throughout, DTRs 2+ throughout, no cerebellar signs, gait normal Psychiatric: normal affect, behavior normal, pleasant  GU/rectal/breast - deferred to gynecology Skin : scattered macules, right lower leg with 1 cm patch of salmon colored skin but within this is a 72m x 2104mpink/brownish raised somewhat asymmetric lesion   Nuclear stress test September 2021 Nondiagnostic ECG stress. There is a fixed moderate defect in the inferior and apical regions suggestive of soft tissue attenuation without ischemia or scar.  Overall LV systolic function is normal without regional wall motion abnormalities. Stress LV EF: 61%.  No previous exam available for comparison. Low risk. Dr. JaAdrian ProwsEchocardiogram 12/2019 1. Left ventricle cavity is normal in size. Mild concentric hypertrophy of the left ventricle. Mildly global hypokinesis. LVEF 45-50%. Normal global wall motion. Doppler evidence of grade I (impaired) diastolic dysfunction, normal LAP. 2. Mild (Grade I) mitral regurgitation. 3. Mild tricuspid regurgitation. 4. No evidence of pulmonary hypertension. 5. No significant change compared to previous study on 10/28/2018. Conclusions: MaVernell Leep MD   Assessment:   Encounter Diagnoses  Name Primary?   Encounter for health maintenance examination in adult Yes   Essential hypertension    Estrogen deficiency    Generalized anxiety disorder    History of colonic polyps    Irritable bowel syndrome with diarrhea    Lynch syndrome    Osteopenia, unspecified location    Post-menopausal    Recurrent cold sores    Vitamin D deficiency    Varicose veins of both lower extremities, unspecified whether complicated    Mild intermittent asthma without complication    Atherosclerosis of abdominal aorta (HCC)    Contracture of right hand    Dyslipidemia    Screening for diabetes mellitus    Medicare annual wellness visit, subsequent       Plan:   A preventative services visit was completed today.  During the course of the visit today, we discussed and counseled about appropriate screening and preventive services.  A health risk assessment was established today that included a review of current medications, allergies, social history, family history, medical and preventative health history, biometrics, and preventative screenings to identify potential safety concerns or impairments.  A personalized plan was printed today for your records and use.   Personalized health advice and education was given today to reduce health risks and promote self management and wellness.  Information regarding end of life planning was discussed today.   Recommendations: Continue to return yearly for your annual wellness and preventative care visits.  This [place] chance to discuss healthy lifestyle, exercise, vaccinations, review your chart record, and perform screenings where appropriate.  I recommend  you see your eye doctor yearly for routine vision care.  I recommend you see your dentist yearly for routine dental care including hygiene visits twice yearly.  See your gynecologist yearly for routine gynecological care.   Vaccination recommendations  were reviewed Immunization History  Administered Date(s) Administered   Fluad Quad(high Dose 65+) 02/24/2020   Hepatitis A 12/09/2017   Hepatitis A, Adult 10/09/2017   Hepatitis B 10/09/2017, 12/09/2017   Influenza Split 02/16/2011, 02/11/2021   Influenza Whole 02/21/2013   Influenza-Unspecified 02/20/2014, 02/20/2015, 01/26/2017, 02/08/2018, 02/17/2019   Meningococcal Conjugate 06/12/2017   PFIZER(Purple Top)SARS-COV-2 Vaccination 06/15/2019, 07/10/2019, 01/26/2020   Pfizer Covid-19 Vaccine Bivalent Booster 8yr & up 08/16/2020   Pneumococcal Conjugate-13 05/23/2015   Pneumococcal Polysaccharide-23 02/03/2012, 07/07/2018   Pneumococcal-Unspecified 06/12/2017   Td 07/18/2007   Tdap 01/16/2011, 05/11/2017   Varicella 04/10/2012   Zoster Recombinat (Shingrix) 12/15/2017, 02/13/2018   Zoster, Live 03/18/2011, 12/09/2017     Screening for cancer: Breast cancer screening: You should perform a self breast exam monthly.   We reviewed recommendations for regular mammograms and breast cancer screening.  Colon cancer screening:  I reviewed your colonoscopy on file that is up to date from February 2022.  Due for repeat colonoscopy at this time.   Follow up with gastroenterology.  Cervical cancer screening: We reviewed recommendations for pap smear screening.  Last Pap smear from 2021 reviewed  Skin cancer screening: Check your skin regularly for new changes, growing lesions, or other lesions of concern Continue routine follow-up with dermatology  Lung cancer screening: If you have a greater than 30 pack year history of tobacco use, then you qualify for lung cancer screening with a chest CT scan  We currently don't have screenings for other cancers besides breast, cervical, colon, and lung cancers.  If you have a strong family history of cancer or have other cancer screening concerns, please let me know.    Bone health: Get at least 150 minutes of aerobic exercise weekly Get  weight bearing exercise at least once weekly 2020 bone density test showed osteopenia.   You should have a repeat bone density now if you haven't already done one with gynecology in the last year or so.   Heart health: Get at least 150 minutes of aerobic exercise weekly Limit alcohol It is important to maintain a healthy blood pressure and healthy cholesterol numbers  She sees cardiology.  I reviewed 2021 nuclear stress test and echo as noted above in exam.    Separate significant issues discussed: Screening for diabetes-hemoglobin A1c today  Consider ABI screen for vascular disease  Aortic atherosclerosis, hyperlipidemia-continue statin, labs today  hypertension - continue current medication  Osteopenia-continue vitamin D supplement, get weightbearing exercise and aerobic activity  Actinic keratosis-continue routine surveillance with dermatology  Varicose veins- consider compression hose.  Continue regular exercise  Insomnia-uses Ambien without any major issues.  Does fine on this.  She is aware of risk versus benefits  Asthma-doing fine on albuterol as needed.    Allergic rhinitis-continue zyrtec  IBS-on medication, managed by GI  Cold sores, genital herpes -  uses acyclovir as needed  Hand contracture - consider hand specialist referral  I sent an electronic E consult for specialty provider review through [facility] on their behalf regarding right lower leg anterior skin lesion.  I will get back in touch with patient pending recommendations from the [facility] consult.  Elliyah was seen today for fasting cpe.  Diagnoses and all orders for this visit:  Encounter for health maintenance examination in adult -     Comprehensive metabolic panel -     CBC -     Lipid panel -     Hemoglobin A1c -     Urinalysis  Essential hypertension -     Comprehensive metabolic panel  Estrogen deficiency  Generalized anxiety disorder  History of colonic polyps  Irritable  bowel syndrome with diarrhea  Lynch syndrome  Osteopenia, unspecified location  Post-menopausal  Recurrent cold sores  Vitamin D deficiency  Varicose veins of both lower extremities, unspecified whether complicated  Mild intermittent asthma without complication  Atherosclerosis of abdominal aorta (HCC)  Contracture of right hand  Dyslipidemia -     Lipid panel  Screening for diabetes mellitus -     Hemoglobin A1c  Medicare annual wellness visit, subsequent    Follow-up pending labs, yearly for physical   Medicare Attestation A preventative services visit was completed today.  During the course of the visit the patient was educated and counseled about appropriate screening and preventive services.  A health risk assessment was established with the patient that included a review of current medications, allergies, social history, family history, medical and preventative health history, biometrics, and preventative screenings to identify potential safety concerns or impairments.  A personalized plan was printed today for the patient's records and use.   Personalized health advice and education was given today to reduce health risks and promote self management and wellness.  Information regarding end of life planning was discussed today.  Dorothea Ogle, PA-C   07/11/2021

## 2021-07-12 LAB — LIPID PANEL
Chol/HDL Ratio: 2.6 ratio (ref 0.0–4.4)
Cholesterol, Total: 187 mg/dL (ref 100–199)
HDL: 73 mg/dL (ref >39)
LDL Chol Calc (NIH): 101 mg/dL — ABNORMAL HIGH (ref 0–99)
Triglycerides: 73 mg/dL (ref 0–149)
VLDL Cholesterol Cal: 13 mg/dL (ref 5–40)

## 2021-07-12 LAB — URINALYSIS
Bilirubin, UA: NEGATIVE
Glucose, UA: NEGATIVE
Ketones, UA: NEGATIVE
Nitrite, UA: NEGATIVE
RBC, UA: NEGATIVE
Specific Gravity, UA: 1.029 (ref 1.005–1.030)
Urobilinogen, Ur: 0.2 mg/dL (ref 0.2–1.0)
pH, UA: 5 (ref 5.0–7.5)

## 2021-07-12 LAB — COMPREHENSIVE METABOLIC PANEL
ALT: 18 IU/L (ref 0–32)
AST: 24 IU/L (ref 0–40)
Albumin/Globulin Ratio: 2.6 — ABNORMAL HIGH (ref 1.2–2.2)
Albumin: 4.7 g/dL (ref 3.7–4.7)
Alkaline Phosphatase: 62 IU/L (ref 44–121)
BUN/Creatinine Ratio: 24 (ref 12–28)
BUN: 19 mg/dL (ref 8–27)
Bilirubin Total: 0.5 mg/dL (ref 0.0–1.2)
CO2: 26 mmol/L (ref 20–29)
Calcium: 9.6 mg/dL (ref 8.7–10.3)
Chloride: 104 mmol/L (ref 96–106)
Creatinine, Ser: 0.79 mg/dL (ref 0.57–1.00)
Globulin, Total: 1.8 g/dL (ref 1.5–4.5)
Glucose: 111 mg/dL — ABNORMAL HIGH (ref 70–99)
Potassium: 4.4 mmol/L (ref 3.5–5.2)
Sodium: 142 mmol/L (ref 134–144)
Total Protein: 6.5 g/dL (ref 6.0–8.5)
eGFR: 80 mL/min/{1.73_m2} (ref >59)

## 2021-07-12 LAB — CBC
Hematocrit: 36.4 % (ref 34.0–46.6)
Hemoglobin: 12.6 g/dL (ref 11.1–15.9)
MCH: 30.7 pg (ref 26.6–33.0)
MCHC: 34.6 g/dL (ref 31.5–35.7)
MCV: 89 fL (ref 79–97)
Platelets: 161 10*3/uL (ref 150–450)
RBC: 4.1 x10E6/uL (ref 3.77–5.28)
RDW: 12.7 % (ref 11.7–15.4)
WBC: 3.3 10*3/uL — ABNORMAL LOW (ref 3.4–10.8)

## 2021-07-12 LAB — HEMOGLOBIN A1C
Est. average glucose Bld gHb Est-mCnc: 105 mg/dL
Hgb A1c MFr Bld: 5.3 % (ref 4.8–5.6)

## 2021-07-14 ENCOUNTER — Telehealth: Payer: Self-pay | Admitting: Medical

## 2021-07-14 ENCOUNTER — Other Ambulatory Visit: Payer: Self-pay | Admitting: Medical

## 2021-07-14 DIAGNOSIS — I700 Atherosclerosis of aorta: Secondary | ICD-10-CM

## 2021-07-14 DIAGNOSIS — Z136 Encounter for screening for cardiovascular disorders: Secondary | ICD-10-CM

## 2021-07-14 DIAGNOSIS — I10 Essential (primary) hypertension: Secondary | ICD-10-CM

## 2021-07-14 DIAGNOSIS — R0989 Other specified symptoms and signs involving the circulatory and respiratory systems: Secondary | ICD-10-CM

## 2021-07-14 MED ORDER — OLMESARTAN MEDOXOMIL-HCTZ 20-12.5 MG PO TABS: 1.0000 | ORAL_TABLET | Freq: Every day | ORAL | 3 refills | Status: DC

## 2021-07-14 MED ORDER — ATORVASTATIN CALCIUM 40 MG PO TABS: 40.0000 mg | ORAL_TABLET | Freq: Every day | ORAL | 3 refills | Status: DC

## 2021-07-14 NOTE — Telephone Encounter (Signed)
Pt does need a refill on her hctz and vitamin d. (Looks like it was filled today) ? ?Also pt is coming in Friday for cryotherapy ?

## 2021-07-14 NOTE — Telephone Encounter (Signed)
I reached out to [employer] at [phone] and her alosetron 0.'5mg'$  has been approved for the tiering exception good from 06/28/21-12/26/2021. ?

## 2021-07-14 NOTE — Telephone Encounter (Signed)
See other lab message from today.  Does she need a refill on HCTZ and vitamin D? ? ?I spoke to the dermatologist.  They felt like her skin lesion was a seborrheic keratosis.  I recommended a round of cryotherapy.  If she wants to come back for this, schedule a time that works for her.  Although the dermatologist did not feel like they were precancerous, they felt like if the lesion did not resolve with cryotherapy today shave biopsy would be in order. ?

## 2021-07-14 NOTE — Telephone Encounter (Signed)
Error/dupliate ?

## 2021-07-14 NOTE — Telephone Encounter (Signed)
Left message for pt to call me back 

## 2021-07-18 ENCOUNTER — Encounter: Payer: Self-pay | Admitting: Medical

## 2021-07-18 ENCOUNTER — Encounter: Payer: Self-pay | Admitting: Internal Medicine

## 2021-07-18 ENCOUNTER — Ambulatory Visit (INDEPENDENT_AMBULATORY_CARE_PROVIDER_SITE_OTHER): Payer: PPO | Admitting: Medical

## 2021-07-18 ENCOUNTER — Other Ambulatory Visit: Payer: Self-pay

## 2021-07-18 VITALS — BP 132/84 | HR 70 | Temp 96.4°F | Ht 66.0 in | Wt 173.4 lb

## 2021-07-18 DIAGNOSIS — L989 Disorder of the skin and subcutaneous tissue, unspecified: Secondary | ICD-10-CM | POA: Diagnosis not present

## 2021-07-18 DIAGNOSIS — L821 Other seborrheic keratosis: Secondary | ICD-10-CM

## 2021-07-18 NOTE — Progress Notes (Signed)
Subjective: ? Shelby Randolph is a 71 y.o. female who presents for ?Chief Complaint  ?Patient presents with  ? Procedure  ?  Skin lesion on right leg  ?   ?Here for cryotherapy. I saw her recently for physical and there was some skin lesions of concern.  She is here for treatment per dermatology recommendations.  No other aggravating or relieving factors.   ? ?No other c/o. ? ?The following portions of the patient's history were reviewed and updated as appropriate: allergies, current medications, past family history, past medical history, past social history, past surgical history and problem list. ? ?ROS ?Otherwise as in subjective above ? ?Objective: ?BP 132/84   Pulse 70   Temp (!) 96.4 ?F (35.8 ?C)   Ht '5\' 6"'$  (1.676 m)   Wt 173 lb 6.4 oz (78.7 kg)   SpO2 99%   BMI 27.99 kg/m?  ? ?General appearance: alert, no distress, well developed, well nourished ?Skin: right lower leg with 87m diameter pink lesion with superior slight raised brown [id number] within the pink lesion.   ?Numerous other benign appearing seborrheic keratotic lesions thought back, both lower legs and few on bilat forearms.  No other worrisome lesions ? ? ? ?Assessment: ?Encounter Diagnoses  ?Name Primary?  ? Skin lesion Yes  ? Seborrheic keratoses   ? ? ? ?Plan: ?I had consulted with [facility] dermatologist about this particular lesion including photo.  They recommended cryotherapy and suggested it was a seborrheic keratosis.  She consents and agrees to procedure.  Applied cryotherapy to the lesion.  We discussed aftercare including using triple antibiotic and bandage if it blisters up.  Use regular soap and water hygeine.  The lesions should gradually fade away hopefully over the next 30 days.  If no resolution of the lesion within a month then recheck ? ?[name] was seen today for procedure. ? ?Diagnoses and all orders for this visit: ? ?Skin lesion ? ?Seborrheic keratoses ? ? ? ?Follow up: prn ? ?

## 2021-07-21 ENCOUNTER — Encounter: Payer: Self-pay | Admitting: Medical

## 2021-07-30 ENCOUNTER — Ambulatory Visit: Payer: PPO | Admitting: Medical

## 2021-07-30 ENCOUNTER — Other Ambulatory Visit: Payer: Self-pay

## 2021-07-30 VITALS — BP 120/70 | HR 73 | Temp 97.3°F | Wt 175.2 lb

## 2021-07-30 DIAGNOSIS — Z5189 Encounter for other specified aftercare: Secondary | ICD-10-CM

## 2021-07-30 DIAGNOSIS — L089 Local infection of the skin and subcutaneous tissue, unspecified: Secondary | ICD-10-CM

## 2021-07-30 MED ORDER — SILVER SULFADIAZINE 1 % EX CREA: 1.0000 "application " | TOPICAL_CREAM | Freq: Every day | CUTANEOUS | 0 refills | Status: DC

## 2021-07-30 MED ORDER — AMOXICILLIN-POT CLAVULANATE 875-125 MG PO TABS: 1.0000 | ORAL_TABLET | Freq: Two times a day (BID) | ORAL | 0 refills | Status: DC

## 2021-07-30 NOTE — Patient Instructions (Signed)
Recommendations:  ?Begin Augmentin antibiotic by mouth. ?Use warm compresses and leg elevation a few times daily for the next few days ?You can use over-the-counter analgesic as needed such as Tylenol or ibuprofen ?During the daytime use Silvadene cream, gauze and bandages dressing.  You can change out the cream and dressing once or twice daily ?Assuming things improve over the next several days, you can stop the Silvadene cream and wrap once you start to see significant improvement ?Once a scab forms and there is no redness or swelling or signs of infection then you can leave it uncovered ?If no improvement or worsening the next 2 days let me know as we would refer to general surgery for wound clinic at that point ? ? ? ?Chapped lips ?Use a lip balm that that is made with bees wax and includes zinc oxide for sunscreen protection ? ?

## 2021-07-30 NOTE — Progress Notes (Signed)
Subjective: ? Selita CHAMYA HUNTON is a 71 y.o. female who presents for ?Chief Complaint  ?Patient presents with  ? spot on leg  ?  Burned off spot but now red and black and hurts. Had low grade fever yesterday  ?   ?Here for wound check.  I saw her on March 10 for cryotherapy of lesion on her right lower leg.  In recent days it has been somewhat swollen, red, dark center some pus drainage and painful.  She felt little feverish and achy.  No other aggravating or relieving factors.   ? ?No other c/o. ? ?The following portions of the patient's history were reviewed and updated as appropriate: allergies, current medications, past family history, past medical history, past social history, past surgical history and problem list. ? ?ROS ?Otherwise as in subjective above ? ?Objective: ?BP 120/70   Pulse 73   Temp (!) 97.3 ?F (36.3 ?C)   Wt 175 lb 3.2 oz (79.5 kg)   BMI 28.28 kg/m?  ? ?General appearance: alert, no distress, well developed, well nourished ?Right lower leg distal third anteriorly with 4 mm round darkish central area with surrounding slightly indurated erythema circular, no pus drainage but she is tender to touch ?Leg neurovascularly intact ? ?Assessment: ?Encounter Diagnoses  ?Name Primary?  ? Visit for wound check Yes  ? Skin infection   ? ? ? ?Plan: ?We discussed the findings.   ? ?Recommendations:  ?Begin Augmentin antibiotic by mouth. ?Use warm compresses and leg elevation a few times daily for the next few days ?You can use over-the-counter analgesic as needed such as Tylenol or ibuprofen ?During the daytime use Silvadene cream, gauze and bandages dressing.  You can change out the cream and dressing once or twice daily ?Assuming things improve over the next several days, you can stop the Silvadene cream and wrap once you start to see significant improvement ?Once a scab forms and there is no redness or swelling or signs of infection then you can leave it uncovered ?If no improvement or worsening the next  2 days let me know as we would refer to general surgery for wound clinic at that point ? ? ?Jillaine was seen today for spot on leg. ? ?Diagnoses and all orders for this visit: ? ?Visit for wound check ? ?Skin infection ? ?Other orders ?-     amoxicillin-clavulanate (AUGMENTIN) 875-125 MG tablet; Take 1 tablet by mouth 2 (two) times daily. ?-     silver sulfADIAZINE (SILVADENE) 1 % cream; Apply 1 application. topically daily. ? ? ? ?Follow up: 3- 4 weeks ?

## 2021-08-10 ENCOUNTER — Other Ambulatory Visit: Payer: Self-pay | Admitting: Medical

## 2021-08-14 ENCOUNTER — Ambulatory Visit (AMBULATORY_SURGERY_CENTER): Payer: PPO | Admitting: *Deleted

## 2021-08-14 VITALS — Ht 68.0 in | Wt 168.0 lb

## 2021-08-14 DIAGNOSIS — Z800 Family history of malignant neoplasm of digestive organs: Secondary | ICD-10-CM

## 2021-08-14 DIAGNOSIS — Z86010 Personal history of colonic polyps: Secondary | ICD-10-CM

## 2021-08-14 DIAGNOSIS — Z1509 Genetic susceptibility to other malignant neoplasm: Secondary | ICD-10-CM

## 2021-08-14 NOTE — Progress Notes (Signed)
No egg or soy allergy known to patient  ?Issues known to pt with past sedation with any surgeries or procedures- PONV  ?Patient denies ever being told they had issues or difficulty with intubation  ?No FH of Malignant Hyperthermia ?Pt is not on diet pills ?Pt is not on  home 02  ?Pt is not on blood thinners  ?Pt denies issues with constipation  ?No A fib or A flutter ? ?  NO PA's for preps discussed with pt In PV today  ?Discussed with pt there will be an out-of-pocket cost for prep and that varies from $0 to 70 +  dollars - pt verbalized understanding  ? ?Due to the COVID-19 pandemic we are asking patients to follow certain guidelines in PV and the [facility]   ?Pt aware of COVID protocols and [facility] guidelines  ? ?PV completed over the phone. Pt verified name, DOB, address and insurance during PV today.  ?Pt mailed instruction packet with copy of consent form to read and not return, and instructions.  ?Pt encouraged to call with questions or issues.  ?If pt has My chart, procedure instructions sent via My Chart  ? ? ?

## 2021-08-17 IMAGING — CR DG CHEST 2V
2 series · 2 of 2 positions shown · non-contrast
Comparison: Chest x-ray 08/14/2016.

CLINICAL DATA: 69-year-old female with history of worsening cough
for the past week.

EXAM:
CHEST - 2 VIEW

[w chest pa]
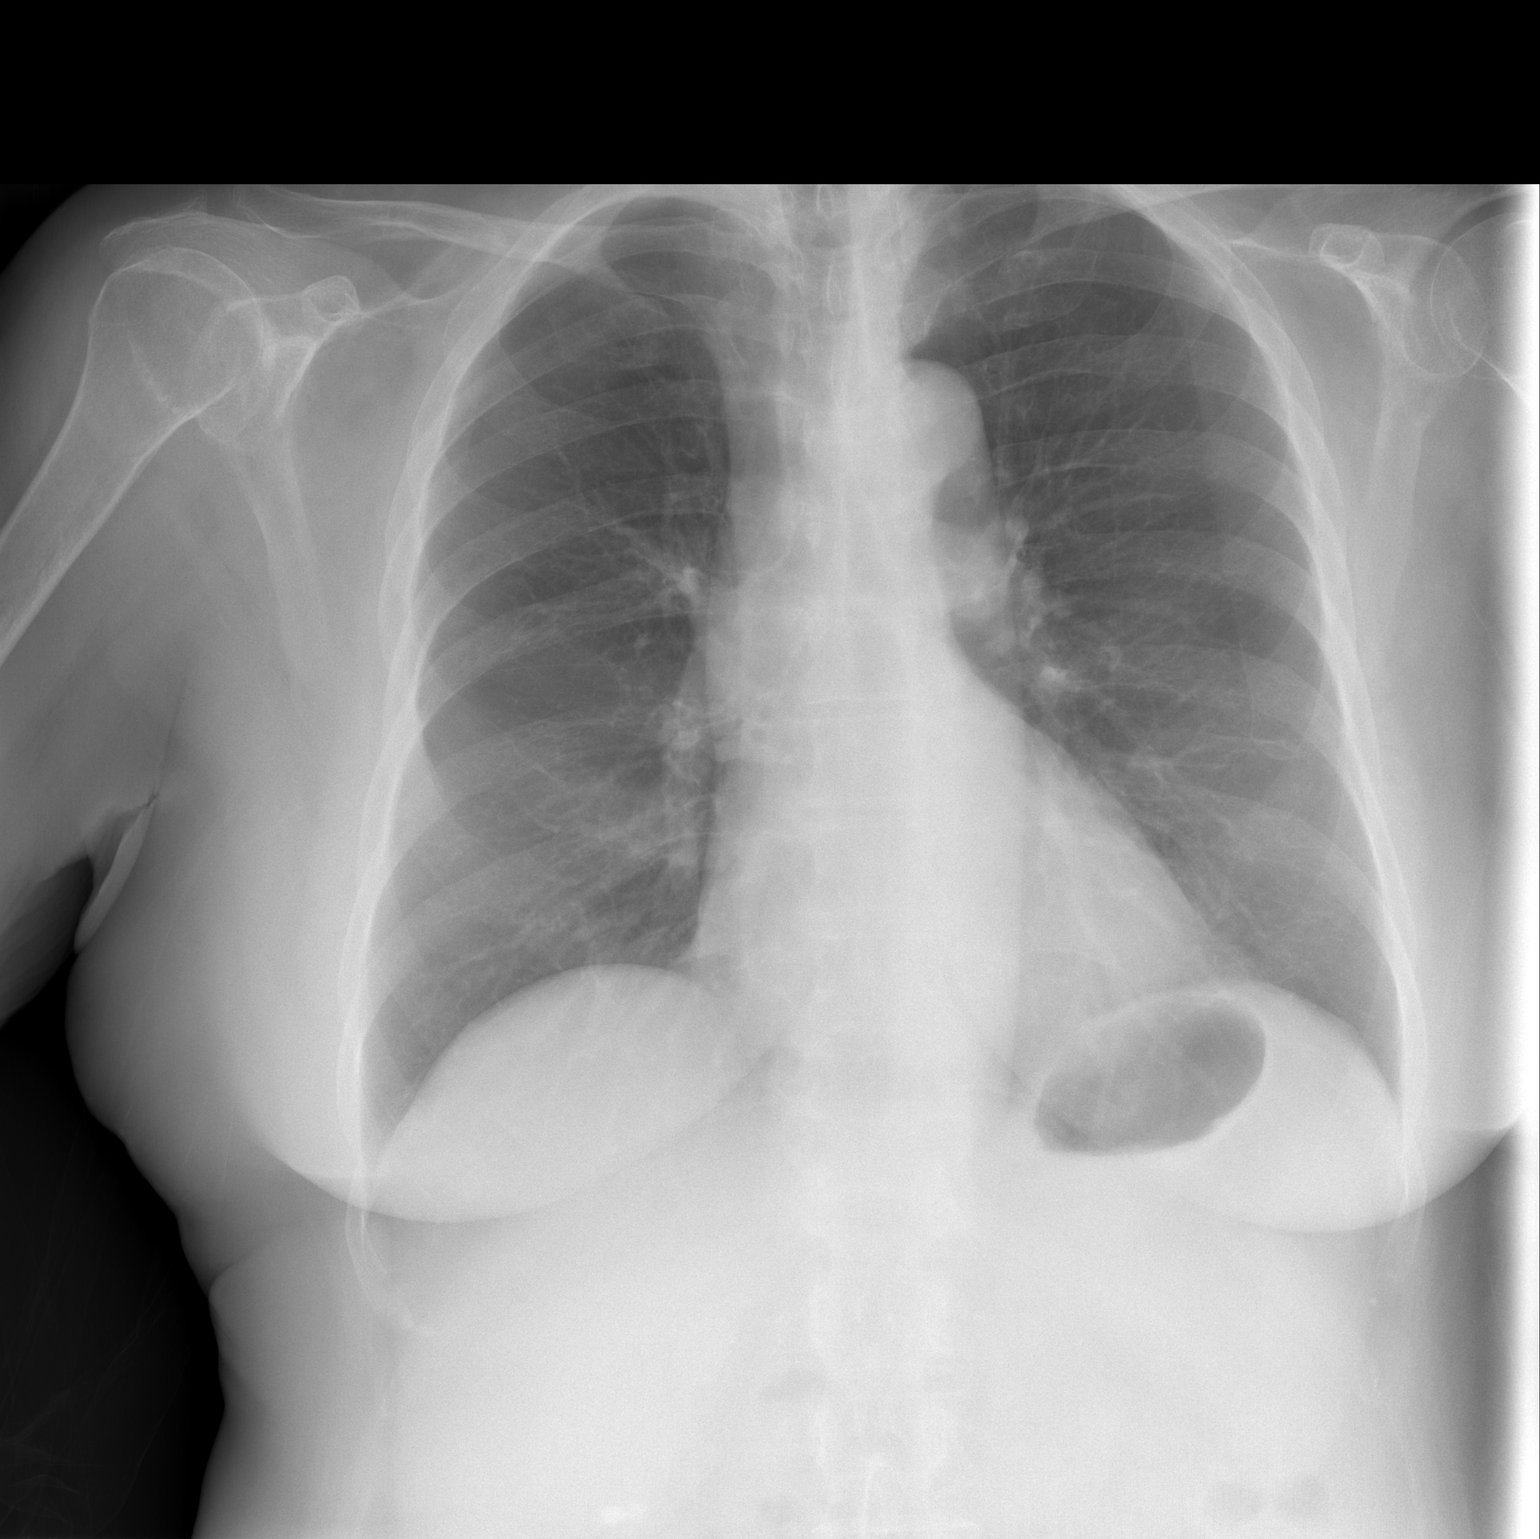

[w chest lat]
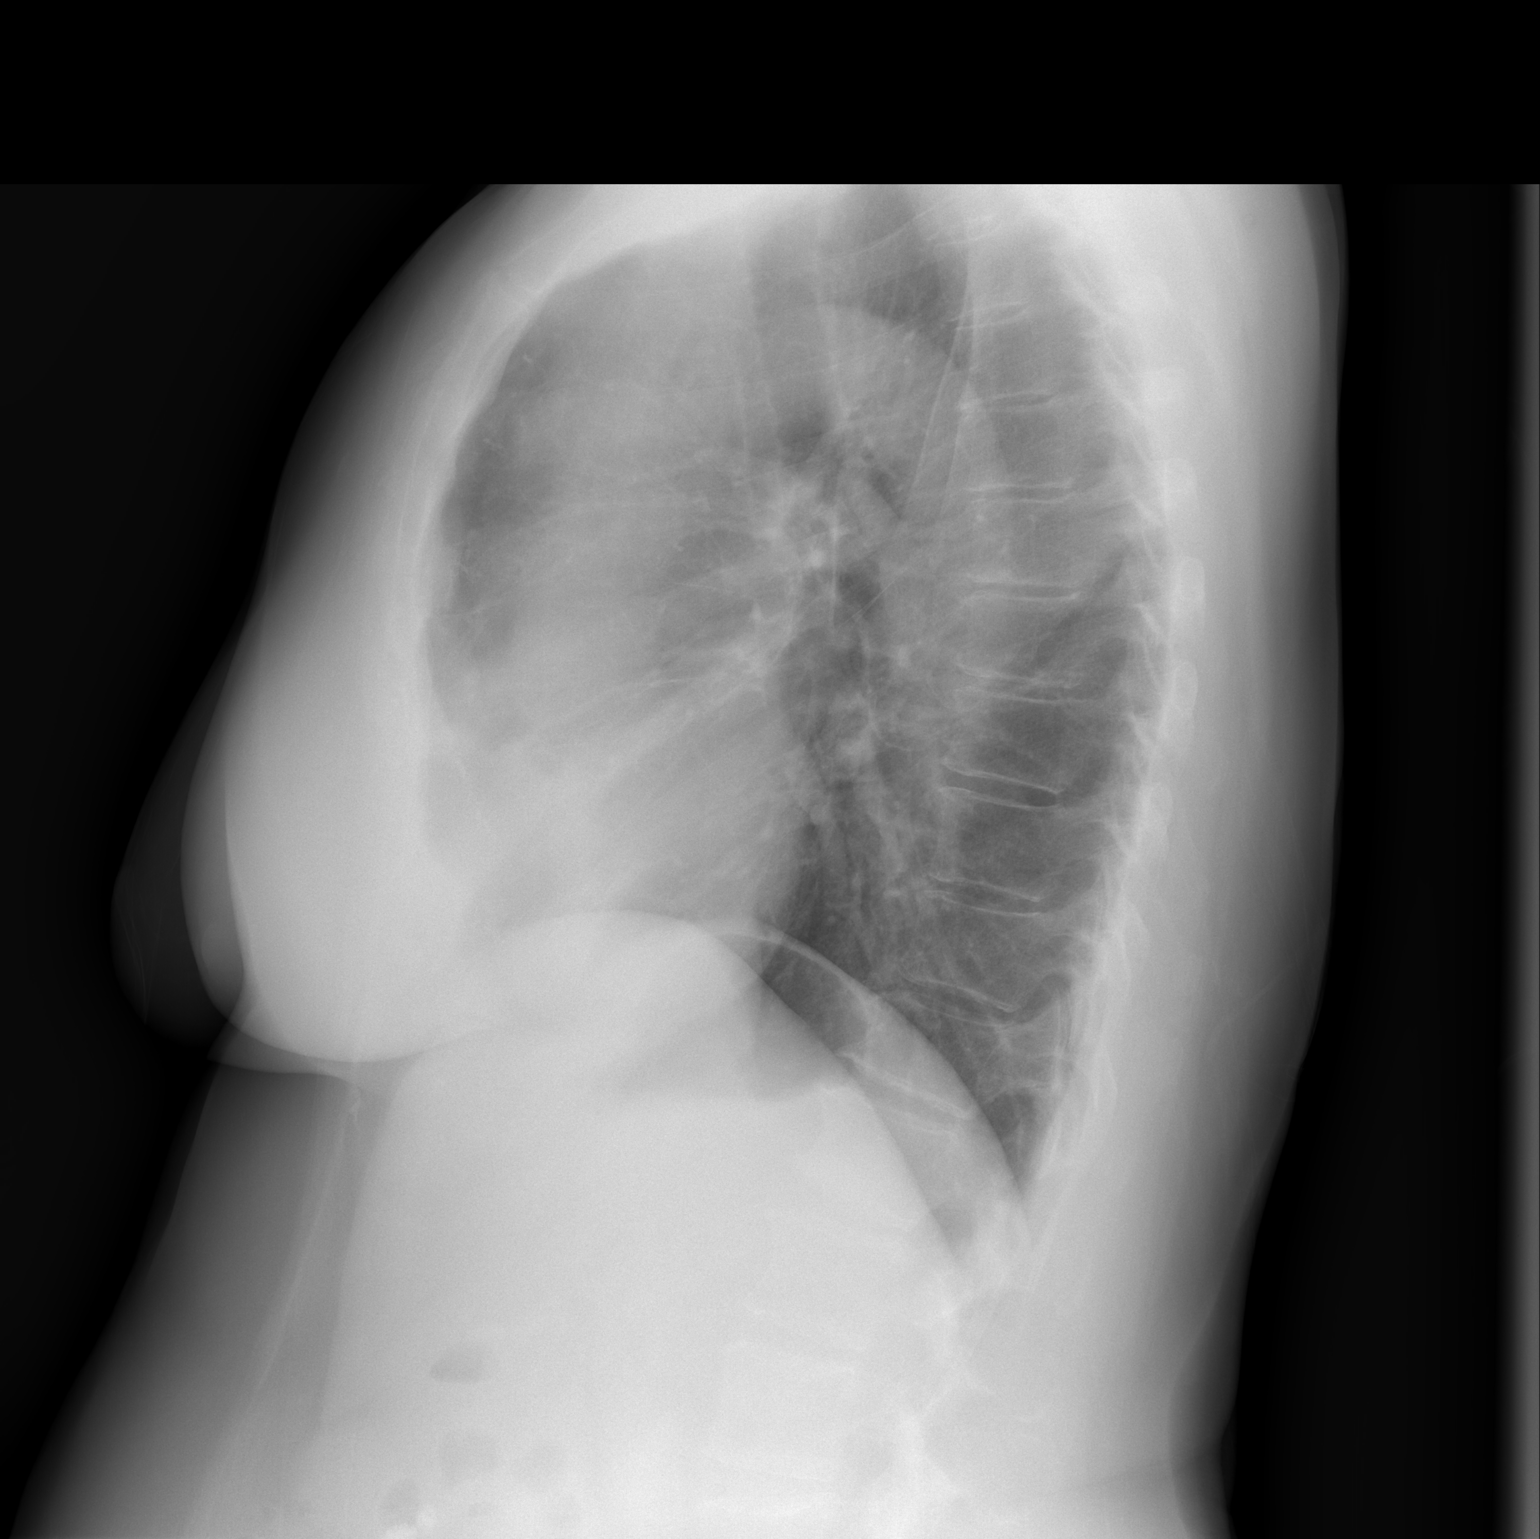

[2 of 2 positions shown; findings below may reference images not displayed]

FINDINGS: Lung volumes are normal. No consolidative airspace disease. No
pleural effusions. No pneumothorax. No pulmonary nodule or mass
noted. Pulmonary vasculature and the cardiomediastinal silhouette
are within normal limits.
IMPRESSION: 1. No significant incidental noncardiac findings are noted.

## 2021-08-20 DIAGNOSIS — D485 Neoplasm of uncertain behavior of skin: Secondary | ICD-10-CM | POA: Diagnosis not present

## 2021-08-20 DIAGNOSIS — C44722 Squamous cell carcinoma of skin of right lower limb, including hip: Secondary | ICD-10-CM | POA: Diagnosis not present

## 2021-08-20 DIAGNOSIS — L24A9 Irritant contact dermatitis due friction or contact with other specified body fluids: Secondary | ICD-10-CM | POA: Diagnosis not present

## 2021-08-25 ENCOUNTER — Telehealth: Payer: Self-pay

## 2021-08-25 NOTE — Telephone Encounter (Signed)
Pt. Needs TB blood test renewal for work if you could put the order in for that. I will call the pt. Back to schedule apt.  ?

## 2021-08-26 ENCOUNTER — Other Ambulatory Visit: Payer: Self-pay | Admitting: Medical

## 2021-08-26 ENCOUNTER — Other Ambulatory Visit: Payer: PPO

## 2021-08-26 DIAGNOSIS — Z111 Encounter for screening for respiratory tuberculosis: Secondary | ICD-10-CM

## 2021-08-27 ENCOUNTER — Encounter: Payer: Self-pay | Admitting: Certified Registered Nurse Anesthetist

## 2021-08-28 ENCOUNTER — Encounter: Payer: Self-pay | Admitting: Internal Medicine

## 2021-08-30 LAB — QUANTIFERON-TB GOLD PLUS
QuantiFERON Mitogen Value: >10 IU/mL
QuantiFERON Nil Value: 0.06 IU/mL
QuantiFERON TB1 Ag Value: 0.05 IU/mL
QuantiFERON TB2 Ag Value: 0.07 IU/mL
QuantiFERON-TB Gold Plus: NEGATIVE

## 2021-09-05 ENCOUNTER — Encounter: Payer: Self-pay | Admitting: Internal Medicine

## 2021-09-05 ENCOUNTER — Ambulatory Visit (AMBULATORY_SURGERY_CENTER): Payer: PPO | Admitting: Internal Medicine

## 2021-09-05 VITALS — BP 116/66 | HR 56 | Temp 96.6°F | Resp 15 | Ht 68.0 in | Wt 168.0 lb

## 2021-09-05 DIAGNOSIS — Z1509 Genetic susceptibility to other malignant neoplasm: Secondary | ICD-10-CM | POA: Diagnosis not present

## 2021-09-05 DIAGNOSIS — I10 Essential (primary) hypertension: Secondary | ICD-10-CM | POA: Diagnosis not present

## 2021-09-05 DIAGNOSIS — Z800 Family history of malignant neoplasm of digestive organs: Secondary | ICD-10-CM | POA: Diagnosis not present

## 2021-09-05 DIAGNOSIS — Z86010 Personal history of colonic polyps: Secondary | ICD-10-CM

## 2021-09-05 MED ORDER — SODIUM CHLORIDE 0.9 % IV SOLN: 500.0000 mL | Freq: Once | INTRAVENOUS | Status: DC

## 2021-09-05 NOTE — Progress Notes (Signed)
[facility] History and Physical ? ? ?Primary Care Physician:  Carlena Hurl, PA-C ? ? ?Reason for Procedure:   Lynch syndrome and Hx colon polyps ? ?Plan:    colonoscopy ? ? ? ? ?HPI: Shelby Randolph is a 71 y.o. female here for f/u colonoscopy - has Lynch Syndrome and hx colon polyps ? ? ?Past Medical History:  ?Diagnosis Date  ? Abnormal screening cardiac CT 11/2011  ? coronary calcium score 13, 24% percentile, mild plaque in prox LAD and ostial RCA  ? Adenomatous colon polyp   ? Dr. Silvano Rusk  ? Allergy   ? Anemia   ? Anxiety   ? Arthritis   ? Asthma   ? Atherosclerosis of aorta ([place]) 03/2014  ? abnormal on ultrasound, repeat US within 5 years  ? Cancer [facility])   ? Skin cancer squamous  ? Depression   ? Diverticulosis   ? Eye injury   ? in college, some pertinent loss of vision in left eye  ? Genital herpes   ? GERD (gastroesophageal reflux disease)   ? H/O echocardiogram 10/28/2018  ? LVEF 45-50%, impaired diastolic dysfunction, mild mitral and tricuspid regurgitation; Dr. Virgina Jock  ? History of bone density study 09/2011  ? normal  ? History of mammogram   ? Dr. Radene Knee  ? History of melanoma   ? sees Lavonna Monarch, dermatology  ? History of PFTs 01/20/2011  ? moderate airflow limitation, no significant response to bronchodilator  ? Hyperlipidemia   ? Hypertension 12/2011  ? Insomnia   ? Migraines   ? Dr. Melton Alar, migraines- PAST HX  ? MSH6-related Lynch syndrome (HNPCC5) 08/20/2017  ? Nearsightedness   ? wears glasses  ? Osteopenia 2020  ? Post-operative nausea and vomiting   ? Routine gynecological examination   ? Dr. Radene Knee  ? 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 ? ?Past Surgical History:  ?Procedure Laterality Date  ? COLONOSCOPY  2016  ? Dr. Silvano Rusk, 2014 Dr. Earlean Shawl  ? COLONOSCOPY  01/2019  ? weak anal sphincter tone, moderate rectocele, polyp, scattered diverticula; Dr. Silvano Rusk  ? ESOPHAGOGASTRODUODENOSCOPY  11/2017  ? normal, Dr. Silvano Rusk  ? POLYPECTOMY    ? ROTATOR CUFF REPAIR  Right 05/2010  ? TONSILLECTOMY AND ADENOIDECTOMY    ? as a kid  ? TUBAL LIGATION  1985  ? VAGINAL HYSTERECTOMY  1998  ? total; abnormal peroids  ? VARICOSE VEIN SURGERY    ? bilat  ? ? ?Prior to Admission medications   ?Medication Sig Start Date End Date Taking? Authorizing Provider  ?acetaminophen (TYLENOL) 500 MG tablet 1-2 tablets twice to three times daily as needed. 02/03/12  Yes Tysinger, Camelia Eng, PA-C  ?alosetron (LOTRONEX) 1 MG tablet Take 1 tablet (1 mg total) by mouth 2 (two) times daily. 07/03/20  Yes Gatha Mayer, MD  ?aspirin EC 81 MG tablet Take 1 tablet (81 mg total) by mouth daily. 07/11/20  Yes Tysinger, Camelia Eng, PA-C  ?atorvastatin (LIPITOR) 40 MG tablet Take 1 tablet (40 mg total) by mouth daily. 07/14/21 07/14/22 Yes Tysinger, Camelia Eng, PA-C  ?cetirizine (ZYRTEC) 10 MG tablet Take 10 mg by mouth daily.   Yes [provider]  ?cholecalciferol (VITAMIN D3) 25 MCG (1000 UNIT) tablet Take 1 tablet (1,000 Units total) by mouth daily. 07/11/20  Yes Tysinger, Camelia Eng, PA-C  ?hydrochlorothiazide (MICROZIDE) 12.5 MG capsule TAKE ONE CAPSULE EACH DAY 08/11/21  Yes Tysinger, Camelia Eng, PA-C  ?ibuprofen (ADVIL) 800 MG tablet Take  800 mg by mouth every 6 (six) hours as needed. 07/02/20  Yes [provider]  ?Multiple Vitamin (MULTIVITAMIN ADULT PO) Multivitamin 50 Plus   Yes [provider]  ?mupirocin ointment (BACTROBAN) 2 % Apply topically 2 (two) times daily. 08/20/21  Yes [provider]  ?olmesartan-hydrochlorothiazide (BENICAR HCT) 20-12.5 MG tablet Take 1 tablet by mouth daily. 07/14/21  Yes Tysinger, Camelia Eng, PA-C  ?ondansetron (ZOFRAN) 4 MG tablet TAKE ONE TABLET EVERY EIGHT HOURS AS NEEDED FOR NAUSEA AND VOMITING -TO PREVENT DIARRHEA MAY USE 2 TABLETS 01/02/21  Yes Gatha Mayer, MD  ?Probiotic Product (PRO-BIOTIC BLEND PO) Take 1 tablet by mouth daily.   Yes [provider]  ?zolpidem (AMBIEN) 10 MG tablet TAKE 1/2 TO 1 TABLET AT BEDTIME 06/19/21  Yes Tysinger, Camelia Eng,  PA-C  ?acyclovir (ZOVIRAX) 400 MG tablet TAKE ONE TABLET TWICE DAILY FOR SUPPRESSION OR 3 TIMES DAILY FOR 5 DAYS FOR FLARE-UP 11/11/20   Tysinger, Camelia Eng, PA-C  ?silver sulfADIAZINE (SILVADENE) 1 % cream Apply 1 application. topically daily. 07/30/21   Tysinger, Camelia Eng, PA-C  ? ? ?Current Outpatient Medications  ?Medication Sig Dispense Refill  ? acetaminophen (TYLENOL) 500 MG tablet 1-2 tablets twice to three times daily as needed. 100 tablet 5  ? alosetron (LOTRONEX) 1 MG tablet Take 1 tablet (1 mg total) by mouth 2 (two) times daily. 60 tablet 11  ? aspirin EC 81 MG tablet Take 1 tablet (81 mg total) by mouth daily. 90 tablet 3  ? atorvastatin (LIPITOR) 40 MG tablet Take 1 tablet (40 mg total) by mouth daily. 90 tablet 3  ? cetirizine (ZYRTEC) 10 MG tablet Take 10 mg by mouth daily.    ? cholecalciferol (VITAMIN D3) 25 MCG (1000 UNIT) tablet Take 1 tablet (1,000 Units total) by mouth daily. 90 tablet 3  ? hydrochlorothiazide (MICROZIDE) 12.5 MG capsule TAKE ONE CAPSULE EACH DAY 90 capsule 1  ? ibuprofen (ADVIL) 800 MG tablet Take 800 mg by mouth every 6 (six) hours as needed.    ? Multiple Vitamin (MULTIVITAMIN ADULT PO) Multivitamin 50 Plus    ? mupirocin ointment (BACTROBAN) 2 % Apply topically 2 (two) times daily.    ? olmesartan-hydrochlorothiazide (BENICAR HCT) 20-12.5 MG tablet Take 1 tablet by mouth daily. 90 tablet 3  ? ondansetron (ZOFRAN) 4 MG tablet TAKE ONE TABLET EVERY EIGHT HOURS AS NEEDED FOR NAUSEA AND VOMITING -TO PREVENT DIARRHEA MAY USE 2 TABLETS 60 tablet 5  ? Probiotic Product (PRO-BIOTIC BLEND PO) Take 1 tablet by mouth daily.    ? zolpidem (AMBIEN) 10 MG tablet TAKE 1/2 TO 1 TABLET AT BEDTIME 90 tablet 0  ? acyclovir (ZOVIRAX) 400 MG tablet TAKE ONE TABLET TWICE DAILY FOR SUPPRESSION OR 3 TIMES DAILY FOR 5 DAYS FOR FLARE-UP 90 tablet 3  ? silver sulfADIAZINE (SILVADENE) 1 % cream Apply 1 application. topically daily. 50 g 0  ? ?Current Facility-Administered Medications  ?Medication Dose  Route Frequency Provider Last Rate Last Admin  ? 0.9 %  sodium chloride infusion  500 mL Intravenous Once Gatha Mayer, MD      ? ? ?Allergies as of 09/05/2021 - Review Complete 09/05/2021  ?Allergen Reaction Noted  ? Sulfonamide derivatives Shortness Of Breath and Swelling 08/21/2008  ? Mucinex [guaifenesin er] Other (See Comments) 05/09/2012  ? ? ?Family History  ?Problem Relation Age of Onset  ? COPD Mother   ?     died age 66yo.  emphysema  ? Colon cancer Father  66  ?      died age 78yo  ? Colon cancer Brother 14  ? Breast cancer Maternal Aunt   ? Colon cancer Paternal Uncle   ?     2  ? Colon cancer Paternal Grandmother 52  ?     colon cancer   ? Breast cancer Daughter   ? Diabetes Neg Hx   ? Heart disease Neg Hx   ? Hypertension Neg Hx   ? Stroke Neg Hx   ? Rectal cancer Neg Hx   ? Stomach cancer Neg Hx   ? Colon polyps Neg Hx   ? Esophageal cancer Neg Hx   ? ? ?Social History  ? ?Socioeconomic History  ? Marital status: Widowed  ?  Spouse name: Not on file  ? Number of children: 5  ? Years of education: Not on file  ? Highest education level: Not on file  ?Occupational History  ? Occupation: compliance agent Investment banker, corporate)  ?  Employer: [employer]  ?  Comment: Nurse Practitioner  ?Tobacco Use  ? Smoking status: Never  ? Smokeless tobacco: Never  ?Vaping Use  ? Vaping Use: Never used  ?Substance and Sexual Activity  ? Alcohol use: Yes  ?  Alcohol/week: 2.0 standard drinks  ?  Types: 2 Glasses of wine per week  ?  Comment: occa  ? Drug use: No  ? Sexual activity: Not on file  ?  Comment: exercise - walks, [place], cardio at gym 3 x/wk, yoga, widowed   ?Other Topics Concern  ? Not on file  ?Social History Narrative  ? 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 ?Social Determinants of  Health  ? ?Financial Resource Strain: Not on file  ?Food Insecurity: Not on file  ?Transportation Needs: Not on file  ?Physical Activity: Not on file  ?Stress: Not on file  ?Social Connections: Not on file  ?I

## 2021-09-05 NOTE — Patient Instructions (Addendum)
No polyps today. ? ?Repeat in 1 year and will check the stomach then also. ? ?I appreciate the opportunity to care for you. ?Gatha Mayer, MD, [facility] ? ?Handouts given for diverticulosis.  Resume previous diet and medications.   ? ?YOU HAD AN ENDOSCOPIC PROCEDURE TODAY AT [facility]:   Refer to the procedure report that was given to you for any specific questions about what was found during the examination.  If the procedure report does not answer your questions, please call your gastroenterologist to clarify.  If you requested that your care partner not be given the details of your procedure findings, then the procedure report has been included in a sealed envelope for you to review at your convenience later. ? ?YOU SHOULD EXPECT: Some feelings of bloating in the abdomen. Passage of more gas than usual.  Walking can help get rid of the air that was put into your GI tract during the procedure and reduce the bloating. If you had a lower endoscopy (such as a colonoscopy or flexible sigmoidoscopy) you may notice spotting of blood in your stool or on the toilet paper. If you underwent a bowel prep for your procedure, you may not have a normal bowel movement for a few days. ? ?Please Note:  You might notice some irritation and congestion in your nose or some drainage.  This is from the oxygen used during your procedure.  There is no need for concern and it should clear up in a day or so. ? ?SYMPTOMS TO REPORT IMMEDIATELY: ? ?Following lower endoscopy (colonoscopy or flexible sigmoidoscopy): ? Excessive amounts of blood in the stool ? Significant tenderness or worsening of abdominal pains ? Swelling of the abdomen that is new, acute ? Fever of 100?F or higher ? ? ?For urgent or emergent issues, a gastroenterologist can be reached at any hour by calling [phone]. ?Do not use MyChart messaging for urgent concerns.  ? ? ?DIET:  We do recommend a small meal at first, but then you may proceed to your  regular diet.  Drink plenty of fluids but you should avoid alcoholic beverages for 24 hours. ? ?ACTIVITY:  You should plan to take it easy for the rest of today and you should NOT DRIVE or use heavy machinery until tomorrow (because of the sedation medicines used during the test).   ? ?FOLLOW UP: ?Our staff will call the number listed on your records 48-72 hours following your procedure to check on you and address any questions or concerns that you may have regarding the information given to you following your procedure. If we do not reach you, we will leave a message.  We will attempt to reach you two times.  During this call, we will ask if you have developed any symptoms of COVID 19. If you develop any symptoms (ie: fever, flu-like symptoms, shortness of breath, cough etc.) before then, please call [phone].  If you test positive for Covid 19 in the 2 weeks post procedure, please call and report this information to [place].   ? ?If any biopsies were taken you will be contacted by phone or by letter within the next 1-3 weeks.  Please call us at [phone] if you have not heard about the biopsies in 3 weeks.  ? ? ?SIGNATURES/CONFIDENTIALITY: ?You and/or your care partner have signed paperwork which will be entered into your electronic medical record.  These signatures attest to the fact that that the information above on your After  Visit Summary has been reviewed and is understood.  Full responsibility of the confidentiality of this discharge information lies with you and/or your care-partner.  ?

## 2021-09-05 NOTE — Progress Notes (Signed)
VS by CW  Pt's states no medical or surgical changes since previsit or office visit.  

## 2021-09-05 NOTE — Op Note (Signed)
[facility] ?Patient Name: Shelby Randolph ?Procedure Date: 09/05/2021 11:13 AM ?MRN: [id number] ?Endoscopist: Gatha Mayer , MD ?Age: 71 ?Referring MD:  ?Date of Birth: [birth year] ?Gender: Female ?Account #: [id number] ?Procedure:                Colonoscopy ?Indications:              High risk colon cancer surveillance: Personal  ?                          history of hereditary nonpolyposis colorectal  ?                          cancer (Lynch Syndrome) ?Medicines:                Monitored Anesthesia Care ?Procedure:                Pre-Anesthesia Assessment: ?                          - Prior to the procedure, a History and Physical  ?                          was performed, and patient medications and  ?                          allergies were reviewed. The patient's tolerance of  ?                          previous anesthesia was also reviewed. The risks  ?                          and benefits of the procedure and the sedation  ?                          options and risks were discussed with the patient.  ?                          All questions were answered, and informed consent  ?                          was obtained. Prior Anticoagulants: The patient has  ?                          taken no previous anticoagulant or antiplatelet  ?                          agents. ASA Grade Assessment: II - A patient with  ?                          mild systemic disease. After reviewing the risks  ?                          and benefits, the patient was deemed in  ?  satisfactory condition to undergo the procedure. ?                          After obtaining informed consent, the colonoscope  ?                          was passed under direct vision. Throughout the  ?                          procedure, the patient's blood pressure, pulse, and  ?                          oxygen saturations were monitored continuously. The  ?                          CF [id number] #[id number] was introduced through  the anus  ?                          and advanced to the the cecum, identified by  ?                          appendiceal orifice and ileocecal valve. The  ?                          colonoscopy was performed without difficulty. The  ?                          patient tolerated the procedure well. The quality  ?                          of the bowel preparation was adequate. The  ?                          ileocecal valve, appendiceal orifice, and rectum  ?                          were photographed. The bowel preparation used was  ?                          Miralax via split dose instruction. ?Scope In: 11:24:46 AM ?Scope Out: 11:40:56 AM ?Scope Withdrawal Time: 0 hours 11 minutes 48 seconds  ?Total Procedure Duration: 0 hours 16 minutes 10 seconds  ?Findings:                 The perianal and digital rectal examinations were  ?                          normal. ?                          Many small and large-mouthed diverticula were found  ?                          in the entire colon. ?  The exam was otherwise without abnormality on  ?                          direct and retroflexion views. ?Complications:            No immediate complications. ?Estimated Blood Loss:     Estimated blood loss: none. ?Impression:               - Severe diverticulosis in the entire examined  ?                          colon. ?                          - The examination was otherwise normal on direct  ?                          and retroflexion views. ?                          - No specimens collected. ?Recommendation:           - Patient has a contact number available for  ?                          emergencies. The signs and symptoms of potential  ?                          delayed complications were discussed with the  ?                          patient. Return to normal activities tomorrow.  ?                          Written discharge instructions were provided to the  ?                           patient. ?                          - Resume previous diet. ?                          - Continue present medications. ?                          - Repeat colonoscopy in 1 year. Lynch syndrome  ?                          (also has hx colon polyps) ?Gatha Mayer, MD ?09/05/2021 11:46:43 AM ?This report has been signed electronically. ?

## 2021-09-05 NOTE — Progress Notes (Signed)
Report given to PACU, vss 

## 2021-09-09 ENCOUNTER — Telehealth: Payer: Self-pay

## 2021-09-09 NOTE — Telephone Encounter (Signed)
?  Follow up Call- ? ? ?  09/05/2021  ? 10:32 AM 07/03/2020  ?  7:50 AM 02/06/2019  ?  8:48 AM  ?Call back number  ?Post procedure Call Back phone  # [phone] cell  ?Permission to leave phone message Yes Yes Yes  ?  ? ?Patient questions: ? ?Do you have a fever, pain , or abdominal swelling? No. ?Pain Score  0 * ? ?Have you tolerated food without any problems? Yes.   ? ?Have you been able to return to your normal activities? Yes.   ? ?Do you have any questions about your discharge instructions: ?Diet   No. ?Medications  No. ?Follow up visit  No. ? ?Do you have questions or concerns about your Care? No. ? ?Actions: ?* If pain score is 4 or above: ?No action needed, pain <4. ? ? ?

## 2021-09-18 DIAGNOSIS — C44722 Squamous cell carcinoma of skin of right lower limb, including hip: Secondary | ICD-10-CM | POA: Diagnosis not present

## 2021-09-18 DIAGNOSIS — I872 Venous insufficiency (chronic) (peripheral): Secondary | ICD-10-CM | POA: Diagnosis not present

## 2021-09-20 ENCOUNTER — Other Ambulatory Visit: Payer: Self-pay | Admitting: Medical

## 2021-11-03 ENCOUNTER — Telehealth: Payer: Self-pay | Admitting: Internal Medicine

## 2021-11-03 ENCOUNTER — Other Ambulatory Visit: Payer: Self-pay

## 2021-11-03 MED ORDER — ALOSETRON HCL 0.5 MG PO TABS: 0.5000 mg | ORAL_TABLET | Freq: Two times a day (BID) | ORAL | 11 refills | Status: DC

## 2021-11-03 MED ORDER — ALOSETRON HCL 1 MG PO TABS: 1.0000 mg | ORAL_TABLET | Freq: Two times a day (BID) | ORAL | 11 refills | Status: DC

## 2021-11-03 NOTE — Telephone Encounter (Signed)
Alosetron refilled,  Castella is up to date on her visits.

## 2021-11-06 DIAGNOSIS — H04123 Dry eye syndrome of bilateral lacrimal glands: Secondary | ICD-10-CM | POA: Diagnosis not present

## 2021-11-06 DIAGNOSIS — H2513 Age-related nuclear cataract, bilateral: Secondary | ICD-10-CM | POA: Diagnosis not present

## 2021-12-06 DIAGNOSIS — R051 Acute cough: Secondary | ICD-10-CM | POA: Diagnosis not present

## 2021-12-06 DIAGNOSIS — Z03818 Encounter for observation for suspected exposure to other biological agents ruled out: Secondary | ICD-10-CM | POA: Diagnosis not present

## 2021-12-06 DIAGNOSIS — Z6825 Body mass index (BMI) 25.0-25.9, adult: Secondary | ICD-10-CM | POA: Diagnosis not present

## 2021-12-06 DIAGNOSIS — I10 Essential (primary) hypertension: Secondary | ICD-10-CM | POA: Diagnosis not present

## 2021-12-08 ENCOUNTER — Ambulatory Visit (INDEPENDENT_AMBULATORY_CARE_PROVIDER_SITE_OTHER): Payer: PPO | Admitting: Medical

## 2021-12-08 ENCOUNTER — Encounter: Payer: Self-pay | Admitting: Medical

## 2021-12-08 VITALS — BP 102/70 | HR 63 | Temp 97.8°F | Resp 18 | Wt 176.6 lb

## 2021-12-08 DIAGNOSIS — J45909 Unspecified asthma, uncomplicated: Secondary | ICD-10-CM | POA: Diagnosis not present

## 2021-12-08 DIAGNOSIS — J40 Bronchitis, not specified as acute or chronic: Secondary | ICD-10-CM

## 2021-12-08 MED ORDER — TRELEGY ELLIPTA 100-62.5-25 MCG/ACT IN AEPB: 1.0000 | INHALATION_SPRAY | Freq: Every day | RESPIRATORY_TRACT | 0 refills | Status: DC

## 2021-12-08 MED ORDER — PREDNISONE 10 MG PO TABS: ORAL_TABLET | ORAL | 0 refills | Status: DC

## 2021-12-08 MED ORDER — HYDROCODONE BIT-HOMATROP MBR 5-1.5 MG/5ML PO SOLN: 5.0000 mL | Freq: Three times a day (TID) | ORAL | 0 refills | Status: AC | PRN

## 2021-12-08 NOTE — Patient Instructions (Addendum)
   Recommendations: Continue albuterol inhaler 2 puffs every 4-6 hours as needed for wheezing or shortness of breath or cough fits Begin Trelegy sample, 1 puff daily for 1 to 2 weeks.  Rinse mouth after water for 2 minutes after use.  This is a long-acting inhaler to help reduce inflammation and open of the air sacs in the lungs I prescribed Hycodan cough syrup you can use for cough suppression if the [employer] are not helping all that much I recommend you only take 1/2 tablet of olmesartan HCT for the next 3 to 4 days as you look a little dehydrated and your blood pressure still on the low side I do see separate hydrochlorothiazide 12.5 mg listed in your med list in addition to olmesartan HCT.  If you have not been having a lot of problems with swelling then I recommend we discontinue this altogether since you have fluid pill already in your combination blood pressure pill Continue good water intake and rest for the next few days Over the next 48 hours if not improving or if new colored mucus or productive cough and call back as we would potentially add an antibiotic if things are worsening

## 2021-12-08 NOTE — Progress Notes (Signed)
Subjective: Chief Complaint  Patient presents with   cough    Cough, SOB, chest congestion- no fever. Symptoms started Thursday. Covid negative Saturday. Went to [facility] Saturday got inhaler and tessalon perles and not helping.    Here for chest cold,  [place] out at the beach last week.  Been feeling bad x 5 days.  Had covid test over weekend that was negative.  [facility], had consult.  Was put on medicaiton for bronchitis tessalon Perles, albuterol inhaler.  She notes cough, headache, SOB.  No fever, no nausea, no vomiting but some loose stools.  No sore throat, + sinus pressure, no productive sputum.  No ear pain. Has asthma and was on Advair in the past but hasn't had to use this in years.   No other aggravating or relieving factors. No other complaint.   Past Medical History:  Diagnosis Date   Abnormal screening cardiac CT 11/2011   coronary calcium score 13, 24% percentile, mild plaque in prox LAD and ostial RCA   Adenomatous colon polyp    Dr. Silvano Rusk   Allergy    Anemia    Anxiety    Arthritis    Asthma    Atherosclerosis of aorta ([place]) 03/2014   abnormal on ultrasound, repeat US within 5 years   Cancer [facility])    Skin cancer squamous   Depression    Diverticulosis    Eye injury    in college, some pertinent loss of vision in left eye   Genital herpes    GERD (gastroesophageal reflux disease)    H/O echocardiogram 10/28/2018   LVEF 45-50%, impaired diastolic dysfunction, mild mitral and tricuspid regurgitation; Dr. Virgina Jock   History of bone density study 09/2011   normal   History of mammogram    Dr. Radene Knee   History of melanoma    sees Lavonna Monarch, dermatology   History of PFTs 01/20/2011   moderate airflow limitation, no significant response to bronchodilator   Hyperlipidemia    Hypertension 12/2011   Insomnia    Migraines    Dr. Melton Alar, migraines- PAST HX   MSH6-related Lynch syndrome (HNPCC5) 08/20/2017   Nearsightedness    wears glasses    Osteopenia 2020   Post-operative nausea and vomiting    Routine gynecological examination    Dr. Sharene Butters virus disease 06/2014   Current Outpatient Medications on File Prior to Visit  Medication Sig Dispense Refill   acetaminophen (TYLENOL) 500 MG tablet 1-2 tablets twice to three times daily as needed. 100 tablet 5   acyclovir (ZOVIRAX) 400 MG tablet TAKE ONE TABLET TWICE DAILY FOR SUPPRESSION OR 3 TIMES DAILY FOR 5 DAYS FOR FLARE-UP 90 tablet 3   aspirin EC 81 MG tablet Take 1 tablet (81 mg total) by mouth daily. 90 tablet 3   atorvastatin (LIPITOR) 40 MG tablet Take 1 tablet (40 mg total) by mouth daily. 90 tablet 3   cetirizine (ZYRTEC) 10 MG tablet Take 10 mg by mouth daily.     cholecalciferol (VITAMIN D3) 25 MCG (1000 UNIT) tablet Take 1 tablet (1,000 Units total) by mouth daily. 90 tablet 3   hydrochlorothiazide (MICROZIDE) 12.5 MG capsule TAKE ONE CAPSULE EACH DAY 90 capsule 1   ibuprofen (ADVIL) 800 MG tablet Take 800 mg by mouth every 6 (six) hours as needed.     Multiple Vitamin (MULTIVITAMIN ADULT PO) Multivitamin 50 Plus     mupirocin ointment (BACTROBAN) 2 % Apply topically 2 (two) times  daily.     olmesartan-hydrochlorothiazide (BENICAR HCT) 20-12.5 MG tablet Take 1 tablet by mouth daily. 90 tablet 3   ondansetron (ZOFRAN) 4 MG tablet TAKE ONE TABLET EVERY EIGHT HOURS AS NEEDED FOR NAUSEA AND VOMITING -TO PREVENT DIARRHEA MAY USE 2 TABLETS 60 tablet 5   Probiotic Product (PRO-BIOTIC BLEND PO) Take 1 tablet by mouth daily.     zolpidem (AMBIEN) 10 MG tablet TAKE 1/2 TO 1 TABLET AT BEDTIME 90 tablet 0   alosetron (LOTRONEX) 0.5 MG tablet Take 1 tablet (0.5 mg total) by mouth 2 (two) times daily. 60 tablet 11   No current facility-administered medications on file prior to visit.   ROS as in subjective    Objective: BP 102/70   Pulse 63   Temp 97.8 F (36.6 C)   Resp 18   Wt 176 lb 9.6 oz (80.1 kg)   SpO2 97%   BMI 26.85 kg/m   BP Readings from Last 3  Encounters:  12/08/21 102/70  09/05/21 116/66  07/30/21 120/70   Wt Readings from Last 3 Encounters:  12/08/21 176 lb 9.6 oz (80.1 kg)  09/05/21 168 lb (76.2 kg)  08/14/21 168 lb (76.2 kg)    Gen: wd, wn, nad HEENT unremarkable Neck supple, nontender, no lymphadenopathy Lungs with few faint scattered wheezes, bronchial sounding throughout, no dullness No leg edema    Assessment: Encounter Diagnoses  Name Primary?   Bronchitis Yes   Moderate asthma, unspecified whether complicated, unspecified whether persistent       Plan: Discussed symptoms and concerns.  Begin recommendations as below.  Recommendations: Continue albuterol inhaler 2 puffs every 4-6 hours as needed for wheezing or shortness of breath or cough fits Begin Trelegy sample, 1 puff daily for 1 to 2 weeks.  Rinse mouth after water for 2 minutes after use.  This is a long-acting inhaler to help reduce inflammation and open of the air sacs in the lungs I prescribed Hycodan cough syrup you can use for cough suppression if the [employer] are not helping all that much I recommend you only take 1/2 tablet of olmesartan HCT for the next 3 to 4 days as you look a little dehydrated and your blood pressure still on the low side I do see separate hydrochlorothiazide 12.5 mg listed in your med list in addition to olmesartan HCT.  If you have not been having a lot of problems with swelling then I recommend we discontinue this altogether since you have fluid pill already in your combination blood pressure pill Continue good water intake and rest for the next few days Over the next 48 hours if not improving or if new colored mucus or productive cough and call back as we would potentially add an antibiotic if things are worsening Shelby Randolph was seen today for cough.  Diagnoses and all orders for this visit:  Bronchitis  Moderate asthma, unspecified whether complicated, unspecified whether persistent  Other orders -      Fluticasone-Umeclidin-Vilant (TRELEGY ELLIPTA) 100-62.5-25 MCG/ACT AEPB; Inhale 1 puff into the lungs daily. -     predniSONE (DELTASONE) 10 MG tablet; 6 tablets day 1, 5 tablets day 2, 4 tablets day 3, 3 tablets day 4, 2 tablets day 5, 1 tablet day 6 -     HYDROcodone bit-homatropine (HYCODAN) 5-1.5 MG/5ML syrup; Take 5 mLs by mouth every 8 (eight) hours as needed for up to 5 days for cough.    F/u prn

## 2021-12-09 ENCOUNTER — Other Ambulatory Visit: Payer: Self-pay | Admitting: Medical

## 2021-12-10 ENCOUNTER — Other Ambulatory Visit: Payer: Self-pay | Admitting: Medical

## 2021-12-10 MED ORDER — AZITHROMYCIN 250 MG PO TABS: ORAL_TABLET | ORAL | 0 refills | Status: DC

## 2021-12-20 ENCOUNTER — Other Ambulatory Visit: Payer: Self-pay | Admitting: Medical

## 2021-12-31 ENCOUNTER — Ambulatory Visit: Payer: PPO | Admitting: Cardiology

## 2021-12-31 ENCOUNTER — Encounter: Payer: Self-pay | Admitting: Cardiology

## 2021-12-31 VITALS — BP 110/65 | HR 76 | Temp 98.2°F | Resp 16 | Ht 68.0 in | Wt 181.0 lb

## 2021-12-31 DIAGNOSIS — I2584 Coronary atherosclerosis due to calcified coronary lesion: Secondary | ICD-10-CM | POA: Diagnosis not present

## 2021-12-31 DIAGNOSIS — E785 Hyperlipidemia, unspecified: Secondary | ICD-10-CM | POA: Diagnosis not present

## 2021-12-31 DIAGNOSIS — I2510 Atherosclerotic heart disease of native coronary artery without angina pectoris: Secondary | ICD-10-CM

## 2021-12-31 DIAGNOSIS — I10 Essential (primary) hypertension: Secondary | ICD-10-CM

## 2021-12-31 DIAGNOSIS — R0609 Other forms of dyspnea: Secondary | ICD-10-CM | POA: Diagnosis not present

## 2021-12-31 NOTE — Progress Notes (Signed)
Shelby Randolph Date of Birth: 18-Nov-1950 MRN: [id number] Primary Care Provider:Tysinger, Camelia Eng, PA-C Former Cardiology Providers: Jeri Lager, APRN, FNP-C Primary Cardiologist: Rex Kras, DO, [facility] (established care 12/22/2019)  Date: 12/31/21 Last Office Visit: 01/01/2021  Chief Complaint  Patient presents with   Follow-up    Annual follow-up    HPI  Shelby Randolph is a 71 y.o.  female who works as a Designer, jewellery at [employer] who presents today for annual follow-up for mildly reduced LVEF. Her past medical history and cardiovascular risk factors include: Hypertension, hyperlipidemia, aortic atherosclerosis, depression, GERD, Lynch syndrome, coronary artery calcification, mildly reduced LVEF, postmenopausal female, advanced age.  Initially referred to the practice for abnormal EKG evaluation and echocardiogram and ischemic work-up was performed.  Echocardiogram noted mildly reduced LVEF and coronary CTA notes CAC and minimal CAD.  She now presents for annual follow-up visit.  Over the last 1 year patient has not had any anginal discomfort but shortness of breath with effort related activities has become a little bit more progressive.  Confounding factors also include frequent bouts of bronchitis.  Since last office visit she was having episodes of hypotension and had discontinued metoprolol and is currently on olmesartan/hydrochlorothiazide.  Echocardiogram is still pending.  She denies orthopnea, paroxysmal nocturnal dyspnea or lower extremity swelling.  No hospitalizations for cardiovascular symptoms since last office encounter.  Due to her shortness of breath her overall physical activity level has reduced.  In the past she used to ambulate at least 7 miles per day and now it is reduced to 4.5 miles approximately.  ALLERGIES: Allergies  Allergen Reactions   Sulfonamide Derivatives Shortness Of Breath and Swelling   Mucinex [Guaifenesin Er] Other (See Comments)     "Jittery and hypes me up"     MEDICATION LIST PRIOR TO VISIT: Current Outpatient Medications on File Prior to Visit  Medication Sig Dispense Refill   alosetron (LOTRONEX) 0.5 MG tablet Take 1 tablet (0.5 mg total) by mouth 2 (two) times daily. 60 tablet 11   aspirin EC 81 MG tablet Take 1 tablet (81 mg total) by mouth daily. 90 tablet 3   atorvastatin (LIPITOR) 40 MG tablet Take 1 tablet (40 mg total) by mouth daily. 90 tablet 3   cetirizine (ZYRTEC) 10 MG tablet Take 10 mg by mouth daily.     cholecalciferol (VITAMIN D3) 25 MCG (1000 UNIT) tablet Take 1 tablet (1,000 Units total) by mouth daily. 90 tablet 3   Multiple Vitamin (MULTIVITAMIN ADULT PO) Multivitamin 50 Plus     mupirocin ointment (BACTROBAN) 2 % Apply topically 2 (two) times daily.     olmesartan-hydrochlorothiazide (BENICAR HCT) 20-12.5 MG tablet Take 1 tablet by mouth daily. 90 tablet 3   ondansetron (ZOFRAN) 4 MG tablet TAKE ONE TABLET EVERY EIGHT HOURS AS NEEDED FOR NAUSEA AND VOMITING -TO PREVENT DIARRHEA MAY USE 2 TABLETS 60 tablet 5   Probiotic Product (PRO-BIOTIC BLEND PO) Take 1 tablet by mouth daily.     zolpidem (AMBIEN) 10 MG tablet TAKE 1/2 TO 1 TABLET AT BEDTIME 90 tablet 0   acetaminophen (TYLENOL) 500 MG tablet 1-2 tablets twice to three times daily as needed. 100 tablet 5   acyclovir (ZOVIRAX) 200 MG capsule TAKE 2 CAPSULES BY MOUTH 3 TIMES A DAY FOR 5 DAYS AS NEEDED FOR FLARE UPS     albuterol (VENTOLIN HFA) 108 (90 Base) MCG/ACT inhaler SMARTSIG:1-2 Puff(s) Via Inhaler Every 4-6 Hours PRN     No current facility-administered  medications on file prior to visit.    PAST MEDICAL HISTORY: Past Medical History:  Diagnosis Date   Abnormal screening cardiac CT 11/2011   coronary calcium score 13, 24% percentile, mild plaque in prox LAD and ostial RCA   Adenomatous colon polyp    Dr. Silvano Rusk   Allergy    Anemia    Anxiety    Arthritis    Asthma    Atherosclerosis of aorta ([place]) 03/2014    abnormal on ultrasound, repeat US within 5 years   Cancer [facility])    Skin cancer squamous   Depression    Diverticulosis    Eye injury    in college, some pertinent loss of vision in left eye   Genital herpes    GERD (gastroesophageal reflux disease)    H/O echocardiogram 10/28/2018   LVEF 45-50%, impaired diastolic dysfunction, mild mitral and tricuspid regurgitation; Dr. Virgina Jock   History of bone density study 09/2011   normal   History of mammogram    Dr. Radene Knee   History of melanoma    sees Lavonna Monarch, dermatology   History of PFTs 01/20/2011   moderate airflow limitation, no significant response to bronchodilator   Hyperlipidemia    Hypertension 12/2011   Insomnia    Migraines    Dr. Melton Alar, migraines- PAST HX   MSH6-related Lynch syndrome (HNPCC5) 08/20/2017   Nearsightedness    wears glasses   Osteopenia 2020   Post-operative nausea and vomiting    Routine gynecological examination    Dr. Sharene Butters virus disease 06/2014    PAST SURGICAL HISTORY: Past Surgical History:  Procedure Laterality Date   COLONOSCOPY  2016   Dr. Silvano Rusk, 2014 Dr. Earlean Shawl   COLONOSCOPY  01/2019   weak anal sphincter tone, moderate rectocele, polyp, scattered diverticula; Dr. Silvano Rusk   ESOPHAGOGASTRODUODENOSCOPY  11/2017   normal, Dr. Silvano Rusk   POLYPECTOMY     ROTATOR CUFF REPAIR Right 05/2010   TONSILLECTOMY AND ADENOIDECTOMY     as a kid   [place]   total; abnormal peroids   VARICOSE VEIN SURGERY     bilat    FAMILY HISTORY: The patient's family history includes Breast cancer in her daughter and maternal aunt; COPD in her mother; Colon cancer in her paternal uncle; Colon cancer (age of onset: 37) in her brother; Colon cancer (age of onset: 49) in her father; Colon cancer (age of onset: 72) in her paternal grandmother.   SOCIAL HISTORY:  The patient  reports that she has never smoked. She has never used smokeless  tobacco. She reports current alcohol use of about 2.0 standard drinks of alcohol per week. She reports that she does not use drugs.  Review of Systems  Constitutional: Negative for chills and fever.  HENT:  Negative for hoarse voice and nosebleeds.   Eyes:  Negative for discharge, double vision and pain.  Cardiovascular:  Positive for dyspnea on exertion (More prominent). Negative for chest pain, claudication, leg swelling, near-syncope, orthopnea, palpitations, paroxysmal nocturnal dyspnea and syncope.  Respiratory:  Positive for cough. Negative for hemoptysis and shortness of breath.   Musculoskeletal:  Negative for muscle cramps and myalgias.  Gastrointestinal:  Negative for abdominal pain, constipation, diarrhea, hematemesis, hematochezia, melena, nausea and vomiting.  Neurological:  Negative for dizziness and light-headedness.   PHYSICAL EXAM:    12/31/2021   10:16 AM 12/08/2021    2:45 PM 09/05/2021   12:05  PM  Vitals with BMI  Height 5' 8"     Weight 181 lbs 176 lbs 10 oz   BMI 00.45    Systolic 997 741 423  Diastolic 65 70 66  Pulse 76 63 56   Physical Exam  Constitutional: No distress.  Age appropriate, hemodynamically stable.   Neck: No JVD present.  Cardiovascular: Normal rate, regular rhythm, S1 normal, S2 normal, intact distal pulses and normal pulses. Exam reveals no gallop, no S3 and no S4.  No murmur heard. Pulmonary/Chest: Effort normal and breath sounds normal. No stridor. She has no wheezes. She has no rales.  Abdominal: Soft. Bowel sounds are normal. She exhibits no distension. There is no abdominal tenderness.  Musculoskeletal:        General: No edema.     Cervical back: Neck supple.  Neurological: She is alert and oriented to person, place, and time. She has intact cranial nerves (2-12).  Skin: Skin is warm and moist.   RADIOLOGY: Coronary CTA 11/16/2018:  1. Coronary calcium score of 13. This was 24th percentile for age and sex matched control. This is  noted in the proximal and mid LAD.   2. Normal coronary origin with right dominance.   3. Minimal calcified plaque in the prox LAD and ostial RCA with no significant stenosis.   4. Aggressive medical therapy is recommended.  CARDIAC DATABASE: EKG: 12/31/2021: Normal sinus rhythm, 81 bpm, left axis, left anterior fascicular, LVH per voltage criteria, without underlying injury pattern.  Echocardiogram: 12/13/2019:  Left ventricle cavity is normal in size. Mild concentric hypertrophy of the left ventricle. Mildly global hypokinesis. LVEF 45-50%. Normal global wall motion. Doppler evidence of grade I (impaired) diastolic dysfunction, normal LAP.  Mild (Grade I) mitral regurgitation.  Mild tricuspid regurgitation.  No evidence of pulmonary hypertension.  Stress Testing:  Lexiscan Tetrofosmin Stress Test 01/18/2020: Nondiagnostic ECG stress. There is a fixed moderate defect in the inferior and apical regions suggestive of soft tissue attenuation without ischemia or scar.  Overall LV systolic function is normal without regional wall motion abnormalities. Stress LV EF: 61%.  No previous exam available for comparison. Low risk.   Heart Catheterization: None  LABORATORY DATA:    Latest Ref Rng & Units 07/11/2021    8:54 AM 07/10/2020    9:31 AM 07/10/2019    8:55 AM  CBC  WBC 3.4 - 10.8 x10E3/uL 3.3  4.1  5.3   Hemoglobin 11.1 - 15.9 g/dL 12.6  12.9  13.3   Hematocrit 34.0 - 46.6 % 36.4  38.9  38.9   Platelets 150 - 450 x10E3/uL 161  182  196        Latest Ref Rng & Units 07/11/2021    8:54 AM 01/17/2021    1:18 PM 07/10/2020    9:31 AM  CMP  Glucose 70 - 99 mg/dL 111  136  103   BUN 8 - 27 mg/dL 19  24  16    Creatinine 0.57 - 1.00 mg/dL 0.79  0.77  0.91   Sodium 134 - 144 mmol/L 142  140  144   Potassium 3.5 - 5.2 mmol/L 4.4  3.8  4.5   Chloride 96 - 106 mmol/L 104  100  106   CO2 20 - 29 mmol/L 26  26  24    Calcium 8.7 - 10.3 mg/dL 9.6  9.8  9.4   Total Protein 6.0 - 8.5 g/dL 6.5    6.9   Total Bilirubin 0.0 - 1.2 mg/dL 0.5  0.6   Alkaline Phos 44 - 121 IU/L 62   74   AST 0 - 40 IU/L 24   23   ALT 0 - 32 IU/L 18   19     Lipid Panel  Lab Results  Component Value Date   CHOL 187 07/11/2021   HDL 73 07/11/2021   LDLCALC 101 (H) 07/11/2021   LDLDIRECT 140.1 03/01/2009   TRIG 73 07/11/2021   CHOLHDL 2.6 07/11/2021     Lab Results  Component Value Date   HGBA1C 5.3 07/11/2021   HGBA1C 5.3 07/10/2020   HGBA1C 5.4 05/23/2015   No components found for: "NTPROBNP" Lab Results  Component Value Date   TSH 2.510 07/10/2019   TSH 1.700 07/07/2018   TSH 1.97 06/25/2016    Cardiac Panel (last 3 results) No results for input(s): "CKTOTAL", "CKMB", "TROPONINIHS", "RELINDX" in the last 72 hours.  IMPRESSION:    ICD-10-CM   1. Dyspnea on exertion  R06.09     2. Coronary atherosclerosis due to calcified coronary lesion of native artery  I25.10    I25.84     3. Essential hypertension  I10 EKG 12-Lead    4. Mild hyperlipidemia  E78.5        RECOMMENDATIONS: CIONNA COLLANTES is a 71 y.o. female whose past medical history and cardiovascular risk factors include: Hypertension, hyperlipidemia, aortic atherosclerosis, depression, GERD, Lynch syndrome, coronary artery calcification, mildly reduced LVEF, postmenopausal female, advanced age.  Dyspnea on exertion Multifactorial: Recent episode of bronchitis, HFpEF, weight gain, etc. We will defer her bronchitis treatment to PCP for now. With regards to mildly reduced LVEF and grade 1 diastolic impairment which was documented back in 2021 would like her to repeat an echocardiogram due to change in clinical status.  Based on the results of the echocardiogram would like to uptitrate her GDMT. Patient is not in overt heart failure. Patient is asked to increase physical activity as tolerated to help facilitate weight loss.  Coronary atherosclerosis due to calcified coronary lesion of native artery Continue aspirin and  statin therapy Independently reviewed lipids from March 2023. LDL has improved from 141 mg/dL to 101 mg/dL. No anginal discomfort. EKG: Nonischemic  Essential hypertension Blood pressures are currently stable on current medical therapy. No changes warranted at this time. Of note, patient did have a history of hypotension since last office visit and therefore beta-blockers were discontinued.  Mild hyperlipidemia See above   FINAL MEDICATION LIST END OF ENCOUNTER: No orders of the defined types were placed in this encounter.    Current Outpatient Medications:    alosetron (LOTRONEX) 0.5 MG tablet, Take 1 tablet (0.5 mg total) by mouth 2 (two) times daily., Disp: 60 tablet, Rfl: 11   aspirin EC 81 MG tablet, Take 1 tablet (81 mg total) by mouth daily., Disp: 90 tablet, Rfl: 3   atorvastatin (LIPITOR) 40 MG tablet, Take 1 tablet (40 mg total) by mouth daily., Disp: 90 tablet, Rfl: 3   cetirizine (ZYRTEC) 10 MG tablet, Take 10 mg by mouth daily., Disp: , Rfl:    cholecalciferol (VITAMIN D3) 25 MCG (1000 UNIT) tablet, Take 1 tablet (1,000 Units total) by mouth daily., Disp: 90 tablet, Rfl: 3   Multiple Vitamin (MULTIVITAMIN ADULT PO), Multivitamin 50 Plus, Disp: , Rfl:    mupirocin ointment (BACTROBAN) 2 %, Apply topically 2 (two) times daily., Disp: , Rfl:    olmesartan-hydrochlorothiazide (BENICAR HCT) 20-12.5 MG tablet, Take 1 tablet by mouth daily., Disp: 90 tablet, Rfl: 3  ondansetron (ZOFRAN) 4 MG tablet, TAKE ONE TABLET EVERY EIGHT HOURS AS NEEDED FOR NAUSEA AND VOMITING -TO PREVENT DIARRHEA MAY USE 2 TABLETS, Disp: 60 tablet, Rfl: 5   Probiotic Product (PRO-BIOTIC BLEND PO), Take 1 tablet by mouth daily., Disp: , Rfl:    zolpidem (AMBIEN) 10 MG tablet, TAKE 1/2 TO 1 TABLET AT BEDTIME, Disp: 90 tablet, Rfl: 0   acetaminophen (TYLENOL) 500 MG tablet, 1-2 tablets twice to three times daily as needed., Disp: 100 tablet, Rfl: 5   acyclovir (ZOVIRAX) 200 MG capsule, TAKE 2 CAPSULES BY  MOUTH 3 TIMES A DAY FOR 5 DAYS AS NEEDED FOR FLARE UPS, Disp: , Rfl:    albuterol (VENTOLIN HFA) 108 (90 Base) MCG/ACT inhaler, SMARTSIG:1-2 Puff(s) Via Inhaler Every 4-6 Hours PRN, Disp: , Rfl:   Orders Placed This Encounter  Procedures   EKG 12-Lead   --Continue cardiac medications as reconciled in final medication list. --Return in about 3 months (around 04/02/2022) for Follow up, Dyspnea. Or sooner if needed. --Continue follow-up with your primary care physician regarding the management of your other chronic comorbid conditions.  Patient's questions and concerns were addressed to her satisfaction. She voices understanding of the instructions provided during this encounter.   This note was created using a voice recognition software as a result there may be grammatical errors inadvertently enclosed that do not reflect the nature of this encounter. Every attempt is made to correct such errors.  Rex Kras, [place], [facility]  Pager: [phone] Office: [phone]

## 2022-01-01 ENCOUNTER — Ambulatory Visit: Payer: PPO

## 2022-01-01 DIAGNOSIS — R0609 Other forms of dyspnea: Secondary | ICD-10-CM | POA: Diagnosis not present

## 2022-01-07 ENCOUNTER — Telehealth: Payer: Self-pay | Admitting: Internal Medicine

## 2022-01-07 ENCOUNTER — Telehealth: Payer: Self-pay

## 2022-01-07 NOTE — Telephone Encounter (Unsigned)
Pt needs pre auth for her rx alusetron. Thank you

## 2022-01-07 NOTE — Telephone Encounter (Signed)
Patient Advocate Encounter   Received notification from [facility] that prior authorization is required for Alosetron.  Submitted: 01/07/2022 [place], CPhT Rx Patient Advocate Phone: [phone]

## 2022-01-07 NOTE — Telephone Encounter (Signed)
I called and updated Shelby Randolph and she said Thank you.

## 2022-01-07 NOTE — Telephone Encounter (Signed)
Will forward this message to our prior authorization team.

## 2022-01-08 ENCOUNTER — Other Ambulatory Visit (HOSPITAL_COMMUNITY): Payer: Self-pay

## 2022-01-08 NOTE — Telephone Encounter (Signed)
Patient Advocate Encounter  Prior Authorization for Alosetron has been approved.   Effective: 01/07/2022 to 07/09/2022  Clista Bernhardt, CPhT Rx Patient Advocate Phone: [phone]

## 2022-01-08 NOTE — Telephone Encounter (Signed)
Shelby Randolph informed and said "Thank You"

## 2022-01-11 ENCOUNTER — Encounter: Payer: Self-pay | Admitting: Cardiology

## 2022-01-14 ENCOUNTER — Encounter: Payer: Self-pay | Admitting: Internal Medicine

## 2022-01-16 NOTE — Telephone Encounter (Signed)
From pt

## 2022-01-17 ENCOUNTER — Other Ambulatory Visit: Payer: Self-pay | Admitting: Cardiology

## 2022-01-17 DIAGNOSIS — R0609 Other forms of dyspnea: Secondary | ICD-10-CM

## 2022-01-28 ENCOUNTER — Other Ambulatory Visit (INDEPENDENT_AMBULATORY_CARE_PROVIDER_SITE_OTHER): Payer: PPO

## 2022-01-28 DIAGNOSIS — Z23 Encounter for immunization: Secondary | ICD-10-CM | POA: Diagnosis not present

## 2022-02-09 ENCOUNTER — Other Ambulatory Visit: Payer: Self-pay | Admitting: Internal Medicine

## 2022-02-10 DIAGNOSIS — L821 Other seborrheic keratosis: Secondary | ICD-10-CM | POA: Diagnosis not present

## 2022-02-10 DIAGNOSIS — D225 Melanocytic nevi of trunk: Secondary | ICD-10-CM | POA: Diagnosis not present

## 2022-02-10 DIAGNOSIS — D485 Neoplasm of uncertain behavior of skin: Secondary | ICD-10-CM | POA: Diagnosis not present

## 2022-02-10 DIAGNOSIS — Z85828 Personal history of other malignant neoplasm of skin: Secondary | ICD-10-CM | POA: Diagnosis not present

## 2022-02-10 DIAGNOSIS — L814 Other melanin hyperpigmentation: Secondary | ICD-10-CM | POA: Diagnosis not present

## 2022-02-10 DIAGNOSIS — L578 Other skin changes due to chronic exposure to nonionizing radiation: Secondary | ICD-10-CM | POA: Diagnosis not present

## 2022-02-16 ENCOUNTER — Encounter: Payer: Self-pay | Admitting: Internal Medicine

## 2022-02-16 DIAGNOSIS — R197 Diarrhea, unspecified: Secondary | ICD-10-CM

## 2022-02-17 ENCOUNTER — Encounter: Payer: Self-pay | Admitting: Internal Medicine

## 2022-02-17 NOTE — Telephone Encounter (Signed)
Olmesaten not new  Will check C diff and GI profile test  She will come to lab tomorrow

## 2022-02-18 ENCOUNTER — Other Ambulatory Visit: Payer: PPO

## 2022-02-18 DIAGNOSIS — R197 Diarrhea, unspecified: Secondary | ICD-10-CM

## 2022-02-19 DIAGNOSIS — M5416 Radiculopathy, lumbar region: Secondary | ICD-10-CM | POA: Diagnosis not present

## 2022-02-20 LAB — CLOSTRIDIUM DIFFICILE BY PCR: Toxigenic C. Difficile by PCR: NEGATIVE

## 2022-02-21 LAB — GI PROFILE, STOOL, PCR

## 2022-03-11 HISTORY — PX: ELBOW SURGERY: SHX618

## 2022-03-17 ENCOUNTER — Encounter: Payer: Self-pay | Admitting: Internal Medicine

## 2022-03-17 ENCOUNTER — Other Ambulatory Visit (INDEPENDENT_AMBULATORY_CARE_PROVIDER_SITE_OTHER): Payer: PPO

## 2022-03-17 ENCOUNTER — Other Ambulatory Visit: Payer: Self-pay | Admitting: Medical

## 2022-03-17 ENCOUNTER — Ambulatory Visit: Payer: PPO | Admitting: Internal Medicine

## 2022-03-17 VITALS — BP 102/64 | HR 84 | Ht 68.0 in | Wt 177.4 lb

## 2022-03-17 DIAGNOSIS — K580 Irritable bowel syndrome with diarrhea: Secondary | ICD-10-CM | POA: Diagnosis not present

## 2022-03-17 DIAGNOSIS — K529 Noninfective gastroenteritis and colitis, unspecified: Secondary | ICD-10-CM

## 2022-03-17 LAB — CBC WITH DIFFERENTIAL/PLATELET
Basophils Absolute: 0 10*3/uL (ref 0.0–0.1)
Basophils Relative: 0.5 % (ref 0.0–3.0)
Eosinophils Absolute: 0.1 10*3/uL (ref 0.0–0.7)
Eosinophils Relative: 1.4 % (ref 0.0–5.0)
HCT: 36.9 % (ref 36.0–46.0)
Hemoglobin: 12.5 g/dL (ref 12.0–15.0)
Lymphocytes Relative: 31.6 % (ref 12.0–46.0)
Lymphs Abs: 1.3 10*3/uL (ref 0.7–4.0)
MCHC: 33.9 g/dL (ref 30.0–36.0)
MCV: 90.3 fl (ref 78.0–100.0)
Monocytes Absolute: 0.4 10*3/uL (ref 0.1–1.0)
Monocytes Relative: 8.9 % (ref 3.0–12.0)
Neutro Abs: 2.4 10*3/uL (ref 1.4–7.7)
Neutrophils Relative %: 57.6 % (ref 43.0–77.0)
Platelets: 172 10*3/uL (ref 150.0–400.0)
RBC: 4.09 Mil/uL (ref 3.87–5.11)
RDW: 13.6 % (ref 11.5–15.5)
WBC: 4.1 10*3/uL (ref 4.0–10.5)

## 2022-03-17 LAB — COMPREHENSIVE METABOLIC PANEL
ALT: 16 U/L (ref 0–35)
AST: 23 U/L (ref 0–37)
Albumin: 4.5 g/dL (ref 3.5–5.2)
Alkaline Phosphatase: 62 U/L (ref 39–117)
BUN: 22 mg/dL (ref 6–23)
CO2: 30 mEq/L (ref 19–32)
Calcium: 9.9 mg/dL (ref 8.4–10.5)
Chloride: 105 mEq/L (ref 96–112)
Creatinine, Ser: 0.99 mg/dL (ref 0.40–1.20)
GFR: 57.15 mL/min — ABNORMAL LOW (ref >60.00)
Glucose, Bld: 100 mg/dL — ABNORMAL HIGH (ref 70–99)
Potassium: 4.3 mEq/L (ref 3.5–5.1)
Sodium: 141 mEq/L (ref 135–145)
Total Bilirubin: 0.6 mg/dL (ref 0.2–1.2)
Total Protein: 7 g/dL (ref 6.0–8.3)

## 2022-03-17 LAB — SEDIMENTATION RATE: Sed Rate: 3 mm/hr (ref 0–30)

## 2022-03-17 LAB — C-REACTIVE PROTEIN: CRP: <1 mg/dL (ref 0.5–20.0)

## 2022-03-17 MED ORDER — ALOSETRON HCL 1 MG PO TABS: 1.0000 mg | ORAL_TABLET | Freq: Two times a day (BID) | ORAL | 5 refills | Status: DC

## 2022-03-17 NOTE — Patient Instructions (Addendum)
Your provider has requested that you go to the basement level for lab work before leaving today. Press "B" on the elevator. The lab is located at the first door on the left as you exit the elevator.  Due to recent changes in healthcare laws, you may see the results of your imaging and laboratory studies on MyChart before your provider has had a chance to review them.  We understand that in some cases there may be results that are confusing or concerning to you. Not all laboratory results come back in the same time frame and the provider may be waiting for multiple results in order to interpret others.  Please give [place] 48 hours in order for your provider to thoroughly review all the results before contacting the office for clarification of your results.   Stop your olmesartan/HCTZ per Dr Carlean Purl and see if the diarrhea improves.   I appreciate the opportunity to care for you. Silvano Rusk, MD, [facility]

## 2022-03-17 NOTE — Progress Notes (Signed)
Shelby Randolph 71 y.o. 08/27/1950 [id number]  Assessment & Plan:   Encounter Diagnoses  Name Primary?   Chronic diarrhea Yes   Irritable bowel syndrome with diarrhea    She has a history of IBS.  2016 random colon biopsies were negative for microscopic colitis.  Infection screening negative now.  Slight response to increasing alosetron dose question if olmesartan is driving this.  It can cause a sprue-like illness with diarrhea.  Labs today to include CBC, CMET, C-reactive protein and sed rate  I am having her hold her olmesartan HCTZ to see what that does if the diarrhea stops I think we will have an answer.  We may need to proceed to colonoscopy and repeat random biopsies looking for colitis or it could be evident perhaps.  She is to monitor her blood pressure and notify if it is excessively high.  CC: Carlena Hurl, PA-C  Subjective:  Gastroenterology summary:  IBS-D  Negative random bxs, celiac testing 2016 Xifaxan not that helpful, dicyclomine and loperamide not helpful FODMAPS and ondansetron do hlep 06/10/2016 try alosetron-helped  Lynch syndrome and personal history of colon polyps  Last colonoscopy April 2023 no polyps  EGD 2022 negative except mild gastritis  Chief Complaint: Diarrhea  HPI 71 year old white woman with a history of Lynch syndrome and IBS-D treated with alosetron who has been having several weeks or longer of increasing diarrhea problems.  Also associated nausea but no cramps.  We had been in contact in September and I worked her up with C. difficile PCR testing and GI pathogen profile which were negative.  I advised that she increase alosetron from 0.5 mg twice daily to 1 mg twice daily.  She has done so and some days she does not have diarrhea and she had 1 formed stool recently but she still having a lot of problems.  She also feels bad on the days that she has terrible diarrhea with aches and just generalized unwellness but does not report  fever and there is no bleeding.  Prior to the onset she had a nonspecific rash on her extremities as well.  No inflammatory joint symptoms reported.  She is not symptomatic at night except rarely.  Unfortunately there has been urgency and fecal incontinence.  Last colonoscopy in April 2023 no polyps no visible signs of colitis.  She has been tested for celiac disease in the past and had random colon biopsies in 2016 that were negative.  An EGD in 2022 showed noninfectious mild gastritis Wt Readings from Last 3 Encounters:  03/17/22 177 lb 6.4 oz (80.5 kg)  12/31/21 181 lb (82.1 kg)  12/08/21 176 lb 9.6 oz (80.1 kg)    Allergies  Allergen Reactions   Sulfonamide Derivatives Shortness Of Breath and Swelling   Mucinex [Guaifenesin Er] Other (See Comments)    "Jittery and hypes me up"   Current Meds  Medication Sig   acetaminophen (TYLENOL) 500 MG tablet 1-2 tablets twice to three times daily as needed.   acyclovir (ZOVIRAX) 200 MG capsule TAKE 2 CAPSULES BY MOUTH 3 TIMES A DAY FOR 5 DAYS AS NEEDED FOR FLARE UPS   albuterol (VENTOLIN HFA) 108 (90 Base) MCG/ACT inhaler SMARTSIG:1-2 Puff(s) Via Inhaler Every 4-6 Hours PRN   aspirin EC 81 MG tablet Take 1 tablet (81 mg total) by mouth daily.   atorvastatin (LIPITOR) 40 MG tablet Take 1 tablet (40 mg total) by mouth daily.   cetirizine (ZYRTEC) 10 MG tablet Take 10 mg by mouth  daily.   cholecalciferol (VITAMIN D3) 25 MCG (1000 UNIT) tablet Take 1 tablet (1,000 Units total) by mouth daily.   Multiple Vitamin (MULTIVITAMIN ADULT PO) Multivitamin 50 Plus   olmesartan-hydrochlorothiazide (BENICAR HCT) 20-12.5 MG tablet Take 1 tablet by mouth daily.   ondansetron (ZOFRAN) 4 MG tablet TAKE ONE TABLET EVERY EIGHT HOURS AS NEEDED FOR NAUSEA AND VOMITING -TO PREVENT DIARRHEA MAY USE 2 TABLETS   Probiotic Product (PRO-BIOTIC BLEND PO) Take 1 tablet by mouth daily.   alosetron (LOTRONEX) 0.5 MG tablet Take 1 tablet (0.5 mg total) by mouth 2 (two) times  daily.           Past Medical History:  Diagnosis Date   Abnormal screening cardiac CT 11/2011   coronary calcium score 13, 24% percentile, mild plaque in prox LAD and ostial RCA   Adenomatous colon polyp    Dr. Silvano Rusk   Allergy    Anemia    Anxiety    Arthritis    Asthma    Atherosclerosis of aorta ([place]) 03/2014   abnormal on ultrasound, repeat US within 5 years   Cancer [facility])    Skin cancer squamous   Depression    Diverticulosis    Eye injury    in college, some pertinent loss of vision in left eye   Genital herpes    GERD (gastroesophageal reflux disease)    H/O echocardiogram 10/28/2018   LVEF 45-50%, impaired diastolic dysfunction, mild mitral and tricuspid regurgitation; Dr. Virgina Jock   History of bone density study 09/2011   normal   History of mammogram    Dr. Radene Knee   History of melanoma    sees Lavonna Monarch, dermatology   History of PFTs 01/20/2011   moderate airflow limitation, no significant response to bronchodilator   Hyperlipidemia    Hypertension 12/2011   Insomnia    Migraines    Dr. Melton Alar, migraines- PAST HX   MSH6-related Lynch syndrome ([facility]NP[facility]5) 08/20/2017   Nearsightedness    wears glasses   Osteopenia 2020   Post-operative nausea and vomiting    Routine gynecological examination    Dr. Sharene Butters virus disease 06/2014   Past Surgical History:  Procedure Laterality Date   COLONOSCOPY  2016   Dr. Silvano Rusk, 2014 Dr. Earlean Shawl   COLONOSCOPY  01/2019   weak anal sphincter tone, moderate rectocele, polyp, scattered diverticula; Dr. Silvano Rusk   ESOPHAGOGASTRODUODENOSCOPY  11/2017   normal, Dr. 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   Review of Systems As per HPI  Objective:   Physical Exam BP 102/64   Pulse 84   Ht _0  (1.727 m)  Wt 177 lb 6.4 oz (80.5 kg)   SpO2 96%   BMI 26.97 kg/m  PE OK

## 2022-03-21 ENCOUNTER — Encounter (HOSPITAL_COMMUNITY): Payer: Self-pay

## 2022-03-21 ENCOUNTER — Emergency Department (HOSPITAL_COMMUNITY): Payer: PPO

## 2022-03-21 ENCOUNTER — Emergency Department (HOSPITAL_COMMUNITY)
Admission: EM | Admit: 2022-03-21 | Discharge: 2022-03-21 | Disposition: A | Discharge status: Home / Self Care | Payer: PPO | Attending: Emergency Medicine | Admitting: Emergency Medicine

## 2022-03-21 ENCOUNTER — Other Ambulatory Visit: Payer: Self-pay

## 2022-03-21 DIAGNOSIS — W109XXA Fall (on) (from) unspecified stairs and steps, initial encounter: Secondary | ICD-10-CM | POA: Insufficient documentation

## 2022-03-21 DIAGNOSIS — S52022A Displaced fracture of olecranon process without intraarticular extension of left ulna, initial encounter for closed fracture: Secondary | ICD-10-CM | POA: Insufficient documentation

## 2022-03-21 DIAGNOSIS — Y92009 Unspecified place in unspecified non-institutional (private) residence as the place of occurrence of the external cause: Secondary | ICD-10-CM | POA: Insufficient documentation

## 2022-03-21 DIAGNOSIS — Z7982 Long term (current) use of aspirin: Secondary | ICD-10-CM | POA: Diagnosis not present

## 2022-03-21 DIAGNOSIS — M25531 Pain in right wrist: Secondary | ICD-10-CM | POA: Insufficient documentation

## 2022-03-21 DIAGNOSIS — M25522 Pain in left elbow: Secondary | ICD-10-CM | POA: Diagnosis present

## 2022-03-21 DIAGNOSIS — R600 Localized edema: Secondary | ICD-10-CM | POA: Diagnosis not present

## 2022-03-21 MED ORDER — OXYCODONE-ACETAMINOPHEN 5-325 MG PO TABS
1.0000 | ORAL_TABLET | Freq: Once | ORAL | Status: AC
  Administered 2022-03-21: 1 via ORAL
  Filled 2022-03-21: qty 1

## 2022-03-21 MED ORDER — OXYCODONE-ACETAMINOPHEN 5-325 MG PO TABS: 1.0000 | ORAL_TABLET | Freq: Four times a day (QID) | ORAL | 0 refills | Status: DC | PRN

## 2022-03-21 NOTE — ED Triage Notes (Signed)
Patient fell down 2 stairs onto her left elbow.

## 2022-03-21 NOTE — Progress Notes (Signed)
Orthopedic Tech Progress Note Patient Details:  Shelby Randolph 1951/01/03 [id number]  Ortho Devices Type of Ortho Device: Long arm splint, Shoulder immobilizer Ortho Device/Splint Location: LUE Ortho Device/Splint Interventions: Ordered, Application, Adjustment  I had to start the splint a little higher that usual because she had jewelry on her wrist that couldn't come off, but splint is above and below the break.  Post Interventions Patient Tolerated: Well  Edwina Barth 03/21/2022, 3:07 PM

## 2022-03-21 NOTE — ED Provider Triage Note (Signed)
Emergency Medicine Provider Triage Evaluation Note  Shelby Randolph , a 71 y.o. female  was evaluated in triage.  Pt complains of acute onset of left elbow pain and swelling occurring just prior to arrival.  Patient was helping to clean off her daughter's porch when she fell down stairs.  She landed on her left elbow.  She denies hitting her head or hurting her neck.  She has swelling of the elbow and pain with extension of the elbow.  No distal numbness or tingling.  No treatments prior to arrival.  She denies shoulder or wrist pain.  Review of Systems  Positive: Elbow pain Negative: Head and neck pain  Physical Exam  BP (!) 143/86 (BP Location: Left Arm)   Pulse 85   Temp 97.9 F (36.6 C) (Oral)   Resp 18   SpO2 100%  Gen:   Awake, no distress   Resp:  Normal effort  MSK:   Moves extremities without difficulty  Other:  Patient yells when trying to extend left elbow, she does have swelling over the posterior portion of the elbow  Medical Decision Making  Medically screening exam initiated at 10:30 AM.  Appropriate orders placed.  Shelby Randolph was informed that the remainder of the evaluation will be completed by another provider, this initial triage assessment does not replace that evaluation, and the importance of remaining in the ED until their evaluation is complete.     Carlisle Cater, PA-C 03/21/22 1031

## 2022-03-21 NOTE — ED Provider Notes (Signed)
[facility] DEPT Provider Note   CSN: [id number] Arrival date & time: 03/21/22  1003     History  Chief Complaint  Patient presents with   Fall    Shelby Randolph is a 71 y.o. female.  Patient presents the emergency department today for evaluation of left elbow pain and swelling.  Just prior to arrival, patient had a fall while cleaning off a porch.  She fell down several stairs striking her left elbow.  She has had associated swelling and pain since that time.  No treatments prior to arrival.  She did not hit her head or hurt her neck.  No anticoagulation.  During ED wait, she has developed some pain in her right wrist as well.       Home Medications Prior to Admission medications   Medication Sig Start Date End Date Taking? Authorizing Provider  acetaminophen (TYLENOL) 500 MG tablet 1-2 tablets twice to three times daily as needed. 02/03/12   Tysinger, Camelia Eng, PA-C  acyclovir (ZOVIRAX) 200 MG capsule TAKE 2 CAPSULES BY MOUTH 3 TIMES A DAY FOR 5 DAYS AS NEEDED FOR FLARE UPS    [provider]  albuterol (VENTOLIN HFA) 108 (90 Base) MCG/ACT inhaler SMARTSIG:1-2 Puff(s) Via Inhaler Every 4-6 Hours PRN 12/06/21   [provider]  alosetron (LOTRONEX) 0.5 MG tablet Take 1 tablet (0.5 mg total) by mouth 2 (two) times daily. 03/17/22   Gatha Mayer, MD  aspirin EC 81 MG tablet Take 1 tablet (81 mg total) by mouth daily. 07/11/20   Tysinger, Camelia Eng, PA-C  atorvastatin (LIPITOR) 40 MG tablet Take 1 tablet (40 mg total) by mouth daily. 07/14/21 07/14/22  Tysinger, Camelia Eng, PA-C  cetirizine (ZYRTEC) 10 MG tablet Take 10 mg by mouth daily.    [provider]  cholecalciferol (VITAMIN D3) 25 MCG (1000 UNIT) tablet Take 1 tablet (1,000 Units total) by mouth daily. 07/11/20   Tysinger, Camelia Eng, PA-C  Multiple Vitamin (MULTIVITAMIN ADULT PO) Multivitamin 50 Plus    [provider]  olmesartan-hydrochlorothiazide (BENICAR HCT) 20-12.5  MG tablet Take 1 tablet by mouth daily. 07/14/21   Tysinger, Camelia Eng, PA-C  ondansetron (ZOFRAN) 4 MG tablet TAKE ONE TABLET EVERY EIGHT HOURS AS NEEDED FOR NAUSEA AND VOMITING -TO PREVENT DIARRHEA MAY USE 2 TABLETS 02/09/22   Gatha Mayer, MD  Probiotic Product (PRO-BIOTIC BLEND PO) Take 1 tablet by mouth daily.    [provider]  zolpidem (AMBIEN) 10 MG tablet TAKE 1/2 TO 1 TABLET AT BEDTIME 03/17/22   Tysinger, Camelia Eng, PA-C      Allergies    Sulfonamide derivatives and Mucinex [guaifenesin er]    Review of Systems   Review of Systems  Physical Exam Updated Vital Signs BP (!) 143/86 (BP Location: Left Arm)   Pulse 85   Temp 97.9 F (36.6 C) (Oral)   Resp 18   SpO2 100%   Physical Exam Vitals and nursing note reviewed.  Constitutional:      Appearance: She is well-developed.  HENT:     Head: Normocephalic and atraumatic.  Eyes:     Pupils: Pupils are equal, round, and reactive to light.  Cardiovascular:     Pulses: Normal pulses. No decreased pulses.  Musculoskeletal:        General: Tenderness present.     Left upper arm: No tenderness.     Right elbow: Normal range of motion.     Left elbow: Effusion present. Decreased  range of motion. Tenderness present in olecranon process.     Left forearm: No tenderness.     Right wrist: Tenderness present. Normal range of motion.     Left wrist: No bony tenderness. Normal range of motion.     Cervical back: Normal range of motion and neck supple.     Comments: Patient with tenderness focused over the left olecranon with large joint effusion noted.  Compartments of the forearm and upper arm are soft without edema or swelling.  Normal range of motion of left wrist.  Skin:    General: Skin is warm and dry.  Neurological:     Mental Status: She is alert.     Sensory: No sensory deficit.     Comments: Motor, sensation, and vascular distal to the injury is fully intact.   Psychiatric:        Mood and Affect: Mood normal.      ED Results / Procedures / Treatments   Labs (all labs ordered are listed, but only abnormal results are displayed) Labs Reviewed - No data to display  EKG None  Radiology DG Elbow Complete Left  Result Date: 03/21/2022 CLINICAL DATA:  Fall, pain EXAM: LEFT ELBOW - COMPLETE 3+ VIEW COMPARISON:  None Available. FINDINGS: Comminuted, distracted fracture of the olecranon process. Large associated elbow joint effusion. No other evident fracture. Soft tissue edema over the olecranon. IMPRESSION: Comminuted, distracted fracture of the left olecranon process. Large associated elbow joint effusion. No other evident fracture. Electronically Signed   By: Delanna Ahmadi M.D.   On: 03/21/2022 11:01    Procedures Procedures    Medications Ordered in ED Medications  oxyCODONE-acetaminophen (PERCOCET/ROXICET) 5-325 MG per tablet 1 tablet (1 tablet Oral Given 03/21/22 1105)    ED Course/ Medical Decision Making/ A&P    Patient seen and examined. History obtained directly from patient.  Patient seen by myself initially on arrival in triage.  Work-up including labs, imaging, EKG ordered in triage, if performed, were reviewed.    Labs/EKG: None ordered  Imaging: X-ray of the left elbow, agree distracted olecranon fracture, closed.  Medications/Fluids: Ordered: Oral Percocet  Most recent vital signs reviewed and are as follows: BP (!) 143/86 (BP Location: Left Arm)   Pulse 85   Temp 97.9 F (36.6 C) (Oral)   Resp 18   SpO2 100%   Initial impression: Left elbow injury  I consulted with Dr. Tempie Donning of orthopedic surgery who will follow up with patient in office.  He asked that patient call office on Monday to schedule an appointment.  Requested long-arm splint.  Patient with extended ED wait time.  After reassessment, she has developed some pain in her right wrist and we will obtain x-ray.  3:10 PM patient stable, orthopedic tech has placed long-arm splint.  Imaging personally  visualized and interpreted including: X-ray of the wrist, agree negative  Reviewed pertinent lab work and imaging with patient at bedside. Questions answered.   Most current vital signs reviewed and are as follows: BP (!) 143/86 (BP Location: Left Arm)   Pulse 85   Temp 97.9 F (36.6 C) (Oral)   Resp 18   SpO2 100%   Plan: Discharge to home.   Prescriptions written for: Percocet if pain not controlled with over-the-counter medications  Other home care instructions discussed: RICE protocol  ED return instructions discussed: Uncontrolled pain, numbness, tingling  Follow-up instructions discussed: Patient encouraged to follow-up with orthopedic physician in 5 days.  Medical Decision Making Amount and/or Complexity of Data Reviewed Radiology: ordered.  Risk Prescription drug management.   Patient with mechanical fall at home.  No concern for head or neck injury.  She has a fracture left olecranon with effusion.  This is a closed fracture.  No signs of compartment syndrome.  Distal CMS intact.  She has right wrist pain with a negative x-ray.        Final Clinical Impression(s) / ED Diagnoses Final diagnoses:  Closed fracture of olecranon process of left ulna, initial encounter  Wrist pain, right    Rx / DC Orders ED Discharge Orders          Ordered    oxyCODONE-acetaminophen (PERCOCET/ROXICET) 5-325 MG tablet  Every 6 hours PRN        03/21/22 1509              Carlisle Cater, PA-C 03/21/22 1511    Audley Hose, MD 03/21/22 [phone]

## 2022-03-21 NOTE — Discharge Instructions (Signed)
Please read and follow all provided instructions.  Your diagnoses today include:  1. Closed fracture of olecranon process of left ulna, initial encounter   2. Wrist pain, right     Tests performed today include: An x-ray of the affected area -shows left elbow fracture, no broken bones in the right wrist Vital signs. See below for your results today.   Medications prescribed:  Please use over-the-counter NSAID medications (ibuprofen, naproxen) or Tylenol (acetaminophen) as directed on the packaging for pain -- as long as you do not have any reasons avoid these medications. Reasons to avoid NSAID medications include: weak kidneys, a history of bleeding in your stomach or gut, or uncontrolled high blood pressure or previous heart attack. Reasons to avoid Tylenol include: liver problems or ongoing alcohol use. Never take more than '4000mg'$  or 8 Extra strength Tylenol in a 24 hour period.     Percocet (oxycodone/acetaminophen) - narcotic pain medication  DO NOT drive or perform any activities that require you to be awake and alert because this medicine can make you drowsy. BE VERY CAREFUL not to take multiple medicines containing Tylenol (also called acetaminophen). Doing so can lead to an overdose which can damage your liver and cause liver failure and possibly death.  Take any prescribed medications only as directed.  Home care instructions:  Follow any educational materials contained in this packet Follow R.I.C.E. Protocol: R - rest your injury  I  - use ice on injury without applying directly to skin C - compress injury with bandage or splint E - elevate the injury as much as possible  Follow-up instructions: Please call Dr. Madelynn Done office or your own orthopedist office on Monday to schedule follow-up appointment  Return instructions:  Please return if your fingers are numb or tingling, appear gray or blue, or you have severe pain (also elevate the arm and loosen splint or wrap if you  were given one) Please return to the Emergency Department if you experience worsening symptoms.  Please return if you have any other emergent concerns.  Additional Information:  Your vital signs today were: BP (!) 143/86 (BP Location: Left Arm)   Pulse 85   Temp 97.9 F (36.6 C) (Oral)   Resp 18   SpO2 100%  If your blood pressure (BP) was elevated above 135/85 this visit, please have this repeated by your doctor within one month. --------------

## 2022-03-23 DIAGNOSIS — M25572 Pain in left ankle and joints of left foot: Secondary | ICD-10-CM | POA: Diagnosis not present

## 2022-03-26 DIAGNOSIS — G8918 Other acute postprocedural pain: Secondary | ICD-10-CM | POA: Diagnosis not present

## 2022-03-26 DIAGNOSIS — S52022A Displaced fracture of olecranon process without intraarticular extension of left ulna, initial encounter for closed fracture: Secondary | ICD-10-CM | POA: Diagnosis not present

## 2022-03-26 DIAGNOSIS — X58XXXA Exposure to other specified factors, initial encounter: Secondary | ICD-10-CM | POA: Diagnosis not present

## 2022-03-26 DIAGNOSIS — S52032A Displaced fracture of olecranon process with intraarticular extension of left ulna, initial encounter for closed fracture: Secondary | ICD-10-CM | POA: Diagnosis not present

## 2022-03-26 DIAGNOSIS — Y999 Unspecified external cause status: Secondary | ICD-10-CM | POA: Diagnosis not present

## 2022-03-27 ENCOUNTER — Ambulatory Visit: Payer: PPO | Admitting: Cardiology

## 2022-03-30 DIAGNOSIS — M79644 Pain in right finger(s): Secondary | ICD-10-CM | POA: Diagnosis not present

## 2022-03-30 DIAGNOSIS — S82892A Other fracture of left lower leg, initial encounter for closed fracture: Secondary | ICD-10-CM | POA: Diagnosis not present

## 2022-04-01 DIAGNOSIS — M79644 Pain in right finger(s): Secondary | ICD-10-CM | POA: Diagnosis not present

## 2022-04-06 ENCOUNTER — Telehealth (INDEPENDENT_AMBULATORY_CARE_PROVIDER_SITE_OTHER): Payer: PPO | Admitting: Medical

## 2022-04-06 ENCOUNTER — Telehealth: Payer: Self-pay

## 2022-04-06 VITALS — Wt 170.0 lb

## 2022-04-06 DIAGNOSIS — I10 Essential (primary) hypertension: Secondary | ICD-10-CM | POA: Diagnosis not present

## 2022-04-06 DIAGNOSIS — S42402D Unspecified fracture of lower end of left humerus, subsequent encounter for fracture with routine healing: Secondary | ICD-10-CM | POA: Diagnosis not present

## 2022-04-06 DIAGNOSIS — J4530 Mild persistent asthma, uncomplicated: Secondary | ICD-10-CM

## 2022-04-06 DIAGNOSIS — R0602 Shortness of breath: Secondary | ICD-10-CM

## 2022-04-06 DIAGNOSIS — R051 Acute cough: Secondary | ICD-10-CM

## 2022-04-06 DIAGNOSIS — S82892D Other fracture of left lower leg, subsequent encounter for closed fracture with routine healing: Secondary | ICD-10-CM

## 2022-04-06 NOTE — Telephone Encounter (Signed)
     Patient  visit on 03/21/2022  at [facility] was for Displaced fracture of olecranon process without intraarticular extension of left ulna, initial encounter for closed fracture.  Have you been able to follow up with your primary care physician? Patient has seen Ortho physician.  The patient was or was not able to obtain any needed medicine or equipment. Patient has obtained medications.  Are there diet recommendations that you are having difficulty following? No  Patient expresses understanding of discharge instructions and education provided has no other needs at this time.    [facility] Care Guide   [email or web address]  ?? [phone]   Website: [email or web address]  [email or web address]

## 2022-04-06 NOTE — Progress Notes (Signed)
Subjective:     Patient ID: Shelby Randolph, female   DOB: 05/06/1951, 71 y.o.   MRN: [id number]  This visit type was conducted due to national recommendations for restrictions regarding the COVID-19 Pandemic (e.g. social distancing) in an effort to limit this patient's exposure and mitigate transmission in our community.  Due to their co-morbid illnesses, this patient is at least at moderate risk for complications without adequate follow up.  This format is felt to be most appropriate for this patient at this time.    Documentation for virtual audio and video telecommunications through [place] encounter:  The patient was located at home. The provider was located in the office. The patient did consent to this visit and is aware of possible charges through their insurance for this visit.  The other persons participating in this telemedicine service were none. Time spent on call was 20 minutes and in review of previous records 20 minutes total.  This virtual service is not related to other E/M service within previous 7 days.   HPI Chief Complaint  Patient presents with   fell  couple weeks ago    Had Surgery on Elbow,  but waiting on right hand MRI to proceed. Having a lot of SOB and not sure if this is the healing process. Went to GI for follow-up and was told her BP could cause her Celiac disease so she is no longer on BP and has helped with her bowels   Virtual for illness.   Interim history: She had a fall on 03/21/22, fell down the stairs at her daughter's house, a [place] type set of stairs only outside.   At that time she was seen in the emergency dept for fall and injury.  At that time she was found to have a left elbow fracture.  She ended up having surgery on this last week.  Shortly after the initial emergency department visit she saw [facility] orthopedics because she was having pain in her ankle and hand and she was diagnosed with a fracture in the left ankle as well as  possible ligament injury of the right hand.  She has an MRI pending for the right hand.  She is wearing a boot for the left foot.  She still has pain in each of those areas elbow, foot and hand  However she has felt a little short of breath.  She notes in the past couple days having some cough, congestion, but may have had shortness of breath longer than in the last week.  She was seen by gastroenterology several weeks ago.  She was taking olmesartan HCT prior to that visit.  She went to Dr. Carlean Purl in gastroenterology due to ongoing diarrhea and abdominal pain.  He felt like her olmesartan HCT to be causing his problems he asked her to stop that blood pressure medication.  She did stop the medicine the symptoms did improve.  She does have a blood pressure cuff but has not been checking her blood pressure lately  Today she reports some shortness of breath.   Feels SOB in general, and sometimes with exertion.  No chest discomfort.  No pain in jaw, no numbness or tingling.  Having some cough x 2 days.  No production with cough.  Has some body aches and chills in past few days.  No fever.  No sick contacts in past week.   No recent covid tests.   Last asthma flare 11/2021.   Hasn't used inhaler in last  few days  No calve pain or swelling or discoloration.  No other aggravating or relieving factors. No other complaint.  Past Medical History:  Diagnosis Date   Abnormal screening cardiac CT 11/2011   coronary calcium score 13, 24% percentile, mild plaque in prox LAD and ostial RCA   Adenomatous colon polyp    Dr. Silvano Rusk   Allergy    Anemia    Anxiety    Arthritis    Asthma    Atherosclerosis of aorta ([place]) 03/2014   abnormal on ultrasound, repeat US within 5 years   Cancer [facility])    Skin cancer squamous   Depression    Diverticulosis    Eye injury    in college, some pertinent loss of vision in left eye   Genital herpes    GERD (gastroesophageal reflux disease)    H/O echocardiogram  10/28/2018   LVEF 45-50%, impaired diastolic dysfunction, mild mitral and tricuspid regurgitation; Dr. Virgina Jock   History of bone density study 09/2011   normal   History of mammogram    Dr. Radene Knee   History of melanoma    sees Lavonna Monarch, dermatology   History of PFTs 01/20/2011   moderate airflow limitation, no significant response to bronchodilator   Hyperlipidemia    Hypertension 12/2011   Insomnia    Migraines    Dr. Melton Alar, migraines- PAST HX   MSH6-related Lynch syndrome (HNPCC5) 08/20/2017   Nearsightedness    wears glasses   Osteopenia 2020   Post-operative nausea and vomiting    Routine gynecological examination    Dr. Sharene Butters virus disease 06/2014   Current Outpatient Medications on File Prior to Visit  Medication Sig Dispense Refill   acetaminophen (TYLENOL) 500 MG tablet 1-2 tablets twice to three times daily as needed. 100 tablet 5   acyclovir (ZOVIRAX) 200 MG capsule TAKE 2 CAPSULES BY MOUTH 3 TIMES A DAY FOR 5 DAYS AS NEEDED FOR FLARE UPS     albuterol (VENTOLIN HFA) 108 (90 Base) MCG/ACT inhaler SMARTSIG:1-2 Puff(s) Via Inhaler Every 4-6 Hours PRN     aspirin EC 81 MG tablet Take 1 tablet (81 mg total) by mouth daily. 90 tablet 3   atorvastatin (LIPITOR) 40 MG tablet Take 1 tablet (40 mg total) by mouth daily. 90 tablet 3   cetirizine (ZYRTEC) 10 MG tablet Take 10 mg by mouth daily.     cholecalciferol (VITAMIN D3) 25 MCG (1000 UNIT) tablet Take 1 tablet (1,000 Units total) by mouth daily. 90 tablet 3   Multiple Vitamin (MULTIVITAMIN ADULT PO) Multivitamin 50 Plus     ondansetron (ZOFRAN) 4 MG tablet TAKE ONE TABLET EVERY EIGHT HOURS AS NEEDED FOR NAUSEA AND VOMITING -TO PREVENT DIARRHEA MAY USE 2 TABLETS 60 tablet 5   oxyCODONE-acetaminophen (PERCOCET/ROXICET) 5-325 MG tablet Take 1 tablet by mouth every 6 (six) hours as needed for severe pain. 6 tablet 0   Probiotic Product (PRO-BIOTIC BLEND PO) Take 1 tablet by mouth daily.     zolpidem (AMBIEN) 10  MG tablet TAKE 1/2 TO 1 TABLET AT BEDTIME 90 tablet 0   alosetron (LOTRONEX) 0.5 MG tablet Take 1 tablet (0.5 mg total) by mouth 2 (two) times daily. 60 tablet 11   No current facility-administered medications on file prior to visit.    Review of Systems As in subjective    Objective:   Physical Exam Due to coronavirus pandemic stay at home measures, patient visit was virtual and they were not examined in  person.   Wt 170 lb (77.1 kg)   BMI 25.85 kg/m   General: Seated on her couch at home, well-appearing, no acute distress She has arm sling around her left arm No slurred speech, no dyspnea, no wheezing No labored breathing    Assessment:     Encounter Diagnoses  Name Primary?   SOB (shortness of breath) Yes   Mild persistent asthma without complication    Acute cough    Closed fracture of left ankle with routine healing, subsequent encounter    Closed fracture of left elbow with routine healing, subsequent encounter    Essential hypertension        Plan:     Short of breath-we discussed her concerns and possible causes.  We discussed limitations of virtual consult.  Differential could include URI symptoms, deconditioning, but cannot rule out something more concerning such as pulmonary embolism, cardiac or other.  Given her recent injury and surgery we at least consider worst-case scenario such as pulmonary embolism.  She currently has no calf swelling or pain.  No hemoptysis.  We discussed considering coming in person for evaluation and vital signs.  She will do a home COVID test and if negative consider coming in for in person evaluation  Asthma without flareup since July.  Advise she use her inhaler the next few days if the shortness of breath is significant or worsens  Cough-advise she go ahead and do a home COVID test today.  If positive call back right away.  If negative consider coming in for in person evaluation today or tomorrow.  She was not sure she could come in  here today or not.  Advised rest, hydration, consider Mucinex DM over-the-counter  I reviewed her recent emergency department notes from 03/21/2022.  She has had elbow surgery last week and has follow-up orthopedic already.  She has MRI pending of the right hand.  Hypertension-recently olmesartan HCT was discontinued by gastroenterology due to concerns for her gastric symptoms being related to that medication.  She is not currently on blood pressure medicine.  I recommend she check her blood pressure with her home cuff when possible over the next 2 days.  She may need to go back on something else.  Her recent blood pressure at the emergency department had a systolic of around 662    Charlynn was seen today for fell  couple weeks ago.  Diagnoses and all orders for this visit:  SOB (shortness of breath)  Mild persistent asthma without complication  Acute cough  Closed fracture of left ankle with routine healing, subsequent encounter  Closed fracture of left elbow with routine healing, subsequent encounter  Essential hypertension  F/u with call back

## 2022-04-08 DIAGNOSIS — M79644 Pain in right finger(s): Secondary | ICD-10-CM | POA: Diagnosis not present

## 2022-04-08 DIAGNOSIS — S52022D Displaced fracture of olecranon process without intraarticular extension of left ulna, subsequent encounter for closed fracture with routine healing: Secondary | ICD-10-CM | POA: Diagnosis not present

## 2022-04-08 DIAGNOSIS — S82892D Other fracture of left lower leg, subsequent encounter for closed fracture with routine healing: Secondary | ICD-10-CM | POA: Diagnosis not present

## 2022-04-22 DIAGNOSIS — H2513 Age-related nuclear cataract, bilateral: Secondary | ICD-10-CM | POA: Diagnosis not present

## 2022-04-22 DIAGNOSIS — H43813 Vitreous degeneration, bilateral: Secondary | ICD-10-CM | POA: Diagnosis not present

## 2022-04-22 DIAGNOSIS — H53022 Refractive amblyopia, left eye: Secondary | ICD-10-CM | POA: Diagnosis not present

## 2022-04-22 DIAGNOSIS — H04123 Dry eye syndrome of bilateral lacrimal glands: Secondary | ICD-10-CM | POA: Diagnosis not present

## 2022-04-29 DIAGNOSIS — S82892D Other fracture of left lower leg, subsequent encounter for closed fracture with routine healing: Secondary | ICD-10-CM | POA: Diagnosis not present

## 2022-04-29 DIAGNOSIS — S52022D Displaced fracture of olecranon process without intraarticular extension of left ulna, subsequent encounter for closed fracture with routine healing: Secondary | ICD-10-CM | POA: Diagnosis not present

## 2022-05-12 ENCOUNTER — Encounter: Payer: Self-pay | Admitting: Medical

## 2022-05-12 ENCOUNTER — Ambulatory Visit (INDEPENDENT_AMBULATORY_CARE_PROVIDER_SITE_OTHER): Payer: PPO | Admitting: Medical

## 2022-05-12 VITALS — BP 124/80 | HR 82 | Temp 98.5°F | Wt 178.4 lb

## 2022-05-12 DIAGNOSIS — R350 Frequency of micturition: Secondary | ICD-10-CM | POA: Diagnosis not present

## 2022-05-12 DIAGNOSIS — M549 Dorsalgia, unspecified: Secondary | ICD-10-CM | POA: Diagnosis not present

## 2022-05-12 DIAGNOSIS — Z20828 Contact with and (suspected) exposure to other viral communicable diseases: Secondary | ICD-10-CM

## 2022-05-12 LAB — POCT INFLUENZA A/B
Influenza A, POC: NEGATIVE
Influenza B, POC: NEGATIVE

## 2022-05-12 LAB — POCT URINALYSIS DIP (CLINITEK)
Blood, UA: NEGATIVE
Glucose, UA: NEGATIVE mg/dL
Ketones, POC UA: NEGATIVE mg/dL
Nitrite, UA: NEGATIVE
Spec Grav, UA: 1.025 (ref 1.010–1.025)
Urobilinogen, UA: 0.2 E.U./dL
pH, UA: 6 (ref 5.0–8.0)

## 2022-05-12 MED ORDER — NITROFURANTOIN MONOHYD MACRO 100 MG PO CAPS: 100.0000 mg | ORAL_CAPSULE | Freq: Two times a day (BID) | ORAL | 0 refills | Status: DC

## 2022-05-12 NOTE — Progress Notes (Signed)
Subjective:  Shelby Randolph is a 72 y.o. female who presents for Chief Complaint  Patient presents with   OTHER    Possible UTI, frequency, HA, and back ache, no burning urination,      Here for possible UTI.  Having some back pain, urinary frequency, headache.   Having some urinary urgency.  No redness, no blood in urine.  No burning with urination.   No vaginal discharge.   No fever, but has had some body aches and chills.    Granddaughter has flu, but Mayreli notes no URI symptoms.    The back pain got her concerned.     She is dealing with recovery from broken left arm and ankle, so no recent activity, fall, trauma or injury to cause back pain.   No pain down legs, no constipation or bowel issues.   No other aggravating or relieving factors.    No other c/o.  Past Medical History:  Diagnosis Date   Abnormal screening cardiac CT 11/2011   coronary calcium score 13, 24% percentile, mild plaque in prox LAD and ostial RCA   Adenomatous colon polyp    Dr. Silvano Rusk   Allergy    Anemia    Anxiety    Arthritis    Asthma    Atherosclerosis of aorta ([place]) 03/2014   abnormal on ultrasound, repeat US within 5 years   Cancer [facility])    Skin cancer squamous   Depression    Diverticulosis    Eye injury    in college, some pertinent loss of vision in left eye   Genital herpes    GERD (gastroesophageal reflux disease)    H/O echocardiogram 10/28/2018   LVEF 45-50%, impaired diastolic dysfunction, mild mitral and tricuspid regurgitation; Dr. Virgina Jock   History of bone density study 09/2011   normal   History of mammogram    Dr. Radene Knee   History of melanoma    sees [facility] Monarch, dermatology   History of PFTs 01/20/2011   moderate airflow limitation, no significant response to bronchodilator   Hyperlipidemia    Hypertension 12/2011   Insomnia    Migraines    Dr. Melton Alar, migraines- PAST HX   MSH6-related Lynch syndrome (HNPCC5) 08/20/2017   Nearsightedness    wears  glasses   Osteopenia 2020   Post-operative nausea and vomiting    Routine gynecological examination    Dr. Sharene Butters virus disease 06/2014   Current Outpatient Medications on File Prior to Visit  Medication Sig Dispense Refill   alosetron (LOTRONEX) 0.5 MG tablet Take 1 tablet (0.5 mg total) by mouth 2 (two) times daily. 60 tablet 11   aspirin EC 81 MG tablet Take 1 tablet (81 mg total) by mouth daily. 90 tablet 3   atorvastatin (LIPITOR) 40 MG tablet Take 1 tablet (40 mg total) by mouth daily. 90 tablet 3   cetirizine (ZYRTEC) 10 MG tablet Take 10 mg by mouth daily.     cholecalciferol (VITAMIN D3) 25 MCG (1000 UNIT) tablet Take 1 tablet (1,000 Units total) by mouth daily. 90 tablet 3   Multiple Vitamin (MULTIVITAMIN ADULT PO) Multivitamin 50 Plus     Probiotic Product (PRO-BIOTIC BLEND PO) Take 1 tablet by mouth daily.     zolpidem (AMBIEN) 10 MG tablet TAKE 1/2 TO 1 TABLET AT BEDTIME 90 tablet 0   acetaminophen (TYLENOL) 500 MG tablet 1-2 tablets twice to three times daily as needed. (Patient not taking: Reported on 05/12/2022) 100 tablet 5  acyclovir (ZOVIRAX) 200 MG capsule TAKE 2 CAPSULES BY MOUTH 3 TIMES A DAY FOR 5 DAYS AS NEEDED FOR FLARE UPS (Patient not taking: Reported on 05/12/2022)     albuterol (VENTOLIN HFA) 108 (90 Base) MCG/ACT inhaler SMARTSIG:1-2 Puff(s) Via Inhaler Every 4-6 Hours PRN (Patient not taking: Reported on 05/12/2022)     ondansetron (ZOFRAN) 4 MG tablet TAKE ONE TABLET EVERY EIGHT HOURS AS NEEDED FOR NAUSEA AND VOMITING -TO PREVENT DIARRHEA MAY USE 2 TABLETS (Patient not taking: Reported on 05/12/2022) 60 tablet 5   oxyCODONE-acetaminophen (PERCOCET/ROXICET) 5-325 MG tablet Take 1 tablet by mouth every 6 (six) hours as needed for severe pain. 6 tablet 0   No current facility-administered medications on file prior to visit.     The following portions of the patient's history were reviewed and updated as appropriate: allergies, current medications, past family  history, past medical history, past social history, past surgical history and problem list.  ROS Otherwise as in subjective above    Objective: BP 124/80   Pulse 82   Temp 98.5 F (36.9 C)   Wt 178 lb 6.4 oz (80.9 kg)   SpO2 93%   BMI 27.13 kg/m   General appearance: alert, no distress, well developed, well nourished Abdomen: +bs, soft, non tender, non distended, no masses, no hepatomegaly, no splenomegaly Back: mild tendnerss in left lumbar region and left CVA region, no spasm or other deformity Seated with left elbow and arm brace   Assessment: Encounter Diagnoses  Name Primary?   Frequent urination Yes   Exposure to influenza    Acute back pain, unspecified back location, unspecified back pain laterality      Plan: We discussed symptoms and concerns  We will send urine for culture.  If any changes with symptoms in the next few days suggestive of UTI begin Macrobid antibiotic as below.  Sent to pharmacy for watch and wait  She has a flu exposure in the house.  Flu swab negative today.  Advised if any new flu like symptoms in the next few days let me know right away and I will call out Tamiflu  I recommended she get her granddaughter reevaluated since her 81 year old granddaughter has been sick for 3 weeks  If new symptoms, worse, or not improving over the next few days let me know right away  Alailah was seen today for other.  Diagnoses and all orders for this visit:  Frequent urination -     POCT URINALYSIS DIP (CLINITEK) -     Urine Culture  Exposure to influenza -     Influenza A/B  Acute back pain, unspecified back location, unspecified back pain laterality  Other orders -     nitrofurantoin, macrocrystal-monohydrate, (MACROBID) 100 MG capsule; Take 1 capsule (100 mg total) by mouth 2 (two) times daily.   Follow up: pending culture

## 2022-05-13 ENCOUNTER — Other Ambulatory Visit: Payer: Self-pay | Admitting: Medical

## 2022-05-13 DIAGNOSIS — M545 Low back pain, unspecified: Secondary | ICD-10-CM

## 2022-05-13 MED ORDER — HYDROCODONE-ACETAMINOPHEN 7.5-325 MG PO TABS: 1.0000 | ORAL_TABLET | Freq: Two times a day (BID) | ORAL | 0 refills | Status: AC | PRN

## 2022-05-13 MED ORDER — HYDROCODONE-ACETAMINOPHEN 7.5-325 MG PO TABS: 1.0000 | ORAL_TABLET | Freq: Two times a day (BID) | ORAL | 0 refills | Status: DC | PRN

## 2022-05-13 NOTE — Telephone Encounter (Signed)
What symptoms today?  Urinary frequency? Urine urgency? Blood in urine? Odor in urine? Fever? Body ache or chills? Other?

## 2022-05-14 ENCOUNTER — Ambulatory Visit
Admission: RE | Admit: 2022-05-14 | Discharge: 2022-05-14 | Disposition: A | Discharge status: Home / Self Care | Payer: PPO | Source: Ambulatory Visit | Attending: Medical | Admitting: Medical

## 2022-05-14 DIAGNOSIS — M47816 Spondylosis without myelopathy or radiculopathy, lumbar region: Secondary | ICD-10-CM | POA: Diagnosis not present

## 2022-05-14 DIAGNOSIS — M545 Low back pain, unspecified: Secondary | ICD-10-CM

## 2022-05-14 DIAGNOSIS — M4316 Spondylolisthesis, lumbar region: Secondary | ICD-10-CM | POA: Diagnosis not present

## 2022-05-14 DIAGNOSIS — M4807 Spinal stenosis, lumbosacral region: Secondary | ICD-10-CM | POA: Diagnosis not present

## 2022-05-14 LAB — URINE CULTURE: Organism ID, Bacteria: NO GROWTH

## 2022-05-15 NOTE — Progress Notes (Signed)
Results sent through MyChart

## 2022-05-15 NOTE — Progress Notes (Signed)
Urine culture shows no infection.  Hopefully she started the medication for pain as needed.  See what her symptoms are today

## 2022-05-18 DIAGNOSIS — S8265XD Nondisplaced fracture of lateral malleolus of left fibula, subsequent encounter for closed fracture with routine healing: Secondary | ICD-10-CM | POA: Diagnosis not present

## 2022-05-18 DIAGNOSIS — S52022D Displaced fracture of olecranon process without intraarticular extension of left ulna, subsequent encounter for closed fracture with routine healing: Secondary | ICD-10-CM | POA: Diagnosis not present

## 2022-05-18 DIAGNOSIS — M25622 Stiffness of left elbow, not elsewhere classified: Secondary | ICD-10-CM | POA: Diagnosis not present

## 2022-05-18 DIAGNOSIS — M6281 Muscle weakness (generalized): Secondary | ICD-10-CM | POA: Diagnosis not present

## 2022-05-20 DIAGNOSIS — S8265XD Nondisplaced fracture of lateral malleolus of left fibula, subsequent encounter for closed fracture with routine healing: Secondary | ICD-10-CM | POA: Diagnosis not present

## 2022-05-20 DIAGNOSIS — M25622 Stiffness of left elbow, not elsewhere classified: Secondary | ICD-10-CM | POA: Diagnosis not present

## 2022-05-20 DIAGNOSIS — M6281 Muscle weakness (generalized): Secondary | ICD-10-CM | POA: Diagnosis not present

## 2022-05-20 DIAGNOSIS — S52022D Displaced fracture of olecranon process without intraarticular extension of left ulna, subsequent encounter for closed fracture with routine healing: Secondary | ICD-10-CM | POA: Diagnosis not present

## 2022-05-26 DIAGNOSIS — M25622 Stiffness of left elbow, not elsewhere classified: Secondary | ICD-10-CM | POA: Diagnosis not present

## 2022-05-26 DIAGNOSIS — S8265XD Nondisplaced fracture of lateral malleolus of left fibula, subsequent encounter for closed fracture with routine healing: Secondary | ICD-10-CM | POA: Diagnosis not present

## 2022-05-26 DIAGNOSIS — M6281 Muscle weakness (generalized): Secondary | ICD-10-CM | POA: Diagnosis not present

## 2022-05-26 DIAGNOSIS — S52022D Displaced fracture of olecranon process without intraarticular extension of left ulna, subsequent encounter for closed fracture with routine healing: Secondary | ICD-10-CM | POA: Diagnosis not present

## 2022-05-27 DIAGNOSIS — S52022D Displaced fracture of olecranon process without intraarticular extension of left ulna, subsequent encounter for closed fracture with routine healing: Secondary | ICD-10-CM | POA: Diagnosis not present

## 2022-05-27 DIAGNOSIS — M25572 Pain in left ankle and joints of left foot: Secondary | ICD-10-CM | POA: Diagnosis not present

## 2022-05-28 ENCOUNTER — Other Ambulatory Visit: Payer: Self-pay | Admitting: Medical

## 2022-05-29 DIAGNOSIS — M6281 Muscle weakness (generalized): Secondary | ICD-10-CM | POA: Diagnosis not present

## 2022-05-29 DIAGNOSIS — M25622 Stiffness of left elbow, not elsewhere classified: Secondary | ICD-10-CM | POA: Diagnosis not present

## 2022-05-29 DIAGNOSIS — S52022D Displaced fracture of olecranon process without intraarticular extension of left ulna, subsequent encounter for closed fracture with routine healing: Secondary | ICD-10-CM | POA: Diagnosis not present

## 2022-05-29 DIAGNOSIS — S8265XD Nondisplaced fracture of lateral malleolus of left fibula, subsequent encounter for closed fracture with routine healing: Secondary | ICD-10-CM | POA: Diagnosis not present

## 2022-05-29 NOTE — Telephone Encounter (Signed)
Is this okay to refill>? 

## 2022-06-01 DIAGNOSIS — S8265XD Nondisplaced fracture of lateral malleolus of left fibula, subsequent encounter for closed fracture with routine healing: Secondary | ICD-10-CM | POA: Diagnosis not present

## 2022-06-01 DIAGNOSIS — M6281 Muscle weakness (generalized): Secondary | ICD-10-CM | POA: Diagnosis not present

## 2022-06-01 DIAGNOSIS — S52022D Displaced fracture of olecranon process without intraarticular extension of left ulna, subsequent encounter for closed fracture with routine healing: Secondary | ICD-10-CM | POA: Diagnosis not present

## 2022-06-01 DIAGNOSIS — M25622 Stiffness of left elbow, not elsewhere classified: Secondary | ICD-10-CM | POA: Diagnosis not present

## 2022-06-08 DIAGNOSIS — M25572 Pain in left ankle and joints of left foot: Secondary | ICD-10-CM | POA: Diagnosis not present

## 2022-06-08 DIAGNOSIS — S52022D Displaced fracture of olecranon process without intraarticular extension of left ulna, subsequent encounter for closed fracture with routine healing: Secondary | ICD-10-CM | POA: Diagnosis not present

## 2022-06-15 ENCOUNTER — Other Ambulatory Visit: Payer: Self-pay | Admitting: Medical

## 2022-06-22 DIAGNOSIS — S8265XD Nondisplaced fracture of lateral malleolus of left fibula, subsequent encounter for closed fracture with routine healing: Secondary | ICD-10-CM | POA: Diagnosis not present

## 2022-06-22 DIAGNOSIS — M25622 Stiffness of left elbow, not elsewhere classified: Secondary | ICD-10-CM | POA: Diagnosis not present

## 2022-06-22 DIAGNOSIS — M6281 Muscle weakness (generalized): Secondary | ICD-10-CM | POA: Diagnosis not present

## 2022-06-22 DIAGNOSIS — S52022D Displaced fracture of olecranon process without intraarticular extension of left ulna, subsequent encounter for closed fracture with routine healing: Secondary | ICD-10-CM | POA: Diagnosis not present

## 2022-06-24 DIAGNOSIS — M25622 Stiffness of left elbow, not elsewhere classified: Secondary | ICD-10-CM | POA: Diagnosis not present

## 2022-06-24 DIAGNOSIS — S52022D Displaced fracture of olecranon process without intraarticular extension of left ulna, subsequent encounter for closed fracture with routine healing: Secondary | ICD-10-CM | POA: Diagnosis not present

## 2022-06-24 DIAGNOSIS — S8265XD Nondisplaced fracture of lateral malleolus of left fibula, subsequent encounter for closed fracture with routine healing: Secondary | ICD-10-CM | POA: Diagnosis not present

## 2022-06-24 DIAGNOSIS — M6281 Muscle weakness (generalized): Secondary | ICD-10-CM | POA: Diagnosis not present

## 2022-07-01 DIAGNOSIS — M25622 Stiffness of left elbow, not elsewhere classified: Secondary | ICD-10-CM | POA: Diagnosis not present

## 2022-07-01 DIAGNOSIS — S8265XD Nondisplaced fracture of lateral malleolus of left fibula, subsequent encounter for closed fracture with routine healing: Secondary | ICD-10-CM | POA: Diagnosis not present

## 2022-07-01 DIAGNOSIS — S52022D Displaced fracture of olecranon process without intraarticular extension of left ulna, subsequent encounter for closed fracture with routine healing: Secondary | ICD-10-CM | POA: Diagnosis not present

## 2022-07-01 DIAGNOSIS — M6281 Muscle weakness (generalized): Secondary | ICD-10-CM | POA: Diagnosis not present

## 2022-07-07 DIAGNOSIS — Z6828 Body mass index (BMI) 28.0-28.9, adult: Secondary | ICD-10-CM | POA: Diagnosis not present

## 2022-07-07 DIAGNOSIS — Z124 Encounter for screening for malignant neoplasm of cervix: Secondary | ICD-10-CM | POA: Diagnosis not present

## 2022-07-07 DIAGNOSIS — Z1231 Encounter for screening mammogram for malignant neoplasm of breast: Secondary | ICD-10-CM | POA: Diagnosis not present

## 2022-07-07 LAB — HM MAMMOGRAPHY

## 2022-07-08 LAB — HM PAP SMEAR

## 2022-07-09 ENCOUNTER — Other Ambulatory Visit: Payer: Self-pay | Admitting: Medical

## 2022-07-09 DIAGNOSIS — I10 Essential (primary) hypertension: Secondary | ICD-10-CM

## 2022-07-09 MED ORDER — OLMESARTAN MEDOXOMIL 20 MG PO TABS: 20.0000 mg | ORAL_TABLET | Freq: Every evening | ORAL | 2 refills | Status: DC

## 2022-07-10 ENCOUNTER — Encounter: Payer: Self-pay | Admitting: Internal Medicine

## 2022-07-13 NOTE — Telephone Encounter (Signed)
Pre-visit and ECL appointment set up.

## 2022-07-14 ENCOUNTER — Encounter: Payer: Self-pay | Admitting: Medical

## 2022-07-14 ENCOUNTER — Ambulatory Visit (INDEPENDENT_AMBULATORY_CARE_PROVIDER_SITE_OTHER): Payer: PPO | Admitting: Medical

## 2022-07-14 VITALS — BP 110/68 | HR 87 | Ht 66.5 in | Wt 174.8 lb

## 2022-07-14 DIAGNOSIS — Z0000 Encounter for general adult medical examination without abnormal findings: Secondary | ICD-10-CM | POA: Diagnosis not present

## 2022-07-14 DIAGNOSIS — Z9181 History of falling: Secondary | ICD-10-CM | POA: Diagnosis not present

## 2022-07-14 DIAGNOSIS — B001 Herpesviral vesicular dermatitis: Secondary | ICD-10-CM | POA: Diagnosis not present

## 2022-07-14 DIAGNOSIS — G4700 Insomnia, unspecified: Secondary | ICD-10-CM

## 2022-07-14 DIAGNOSIS — I10 Essential (primary) hypertension: Secondary | ICD-10-CM

## 2022-07-14 DIAGNOSIS — K580 Irritable bowel syndrome with diarrhea: Secondary | ICD-10-CM | POA: Diagnosis not present

## 2022-07-14 DIAGNOSIS — M8580 Other specified disorders of bone density and structure, unspecified site: Secondary | ICD-10-CM | POA: Diagnosis not present

## 2022-07-14 DIAGNOSIS — E559 Vitamin D deficiency, unspecified: Secondary | ICD-10-CM | POA: Diagnosis not present

## 2022-07-14 DIAGNOSIS — Z780 Asymptomatic menopausal state: Secondary | ICD-10-CM | POA: Diagnosis not present

## 2022-07-14 DIAGNOSIS — R2681 Unsteadiness on feet: Secondary | ICD-10-CM

## 2022-07-14 DIAGNOSIS — Z7184 Encounter for health counseling related to travel: Secondary | ICD-10-CM

## 2022-07-14 DIAGNOSIS — I700 Atherosclerosis of aorta: Secondary | ICD-10-CM

## 2022-07-14 DIAGNOSIS — F411 Generalized anxiety disorder: Secondary | ICD-10-CM

## 2022-07-14 DIAGNOSIS — I8393 Asymptomatic varicose veins of bilateral lower extremities: Secondary | ICD-10-CM

## 2022-07-14 DIAGNOSIS — Z1509 Genetic susceptibility to other malignant neoplasm: Secondary | ICD-10-CM | POA: Diagnosis not present

## 2022-07-14 DIAGNOSIS — J4520 Mild intermittent asthma, uncomplicated: Secondary | ICD-10-CM

## 2022-07-14 DIAGNOSIS — E785 Hyperlipidemia, unspecified: Secondary | ICD-10-CM

## 2022-07-14 DIAGNOSIS — Z7185 Encounter for immunization safety counseling: Secondary | ICD-10-CM | POA: Diagnosis not present

## 2022-07-14 LAB — POCT URINALYSIS DIP (PROADVANTAGE DEVICE)
Bilirubin, UA: NEGATIVE
Blood, UA: NEGATIVE
Glucose, UA: NEGATIVE mg/dL
Ketones, POC UA: NEGATIVE mg/dL
Leukocytes, UA: NEGATIVE
Nitrite, UA: NEGATIVE
Protein Ur, POC: NEGATIVE mg/dL
Specific Gravity, Urine: 1.02
Urobilinogen, Ur: NEGATIVE
pH, UA: 6 (ref 5.0–8.0)

## 2022-07-14 MED ORDER — FLUOXETINE HCL 10 MG PO TABS: 10.0000 mg | ORAL_TABLET | Freq: Every day | ORAL | 0 refills | Status: DC

## 2022-07-14 MED ORDER — ASPIRIN 81 MG PO TBEC: 81.0000 mg | DELAYED_RELEASE_TABLET | Freq: Every day | ORAL | 3 refills | Status: DC

## 2022-07-14 MED ORDER — SCOPOLAMINE 1 MG/3DAYS TD PT72: 1.0000 | MEDICATED_PATCH | TRANSDERMAL | 0 refills | Status: DC

## 2022-07-14 MED ORDER — CIPROFLOXACIN HCL 500 MG PO TABS: 500.0000 mg | ORAL_TABLET | Freq: Two times a day (BID) | ORAL | 0 refills | Status: AC

## 2022-07-14 MED ORDER — ATORVASTATIN CALCIUM 40 MG PO TABS: 40.0000 mg | ORAL_TABLET | Freq: Every day | ORAL | 3 refills | Status: DC

## 2022-07-14 NOTE — Patient Instructions (Signed)
Cipro uses: 1 tablet twice daily for 3 days for urinary tract infection  1 tablet twice daily for 5 days for respiratory tract infection  1 tablet twice daily for 2-3 days for travelers diarrhea    Scopolamine patch  1 patch every 3 days for sea sickness    Begin Fluoxetine Prozac once daily to help with anxiety   Change your Olmesartan BP medication to moring

## 2022-07-14 NOTE — Progress Notes (Signed)
Subjective:    Shelby Randolph is a 72 y.o. female who presents for Preventative Services visit and chronic medical problems/med check visit.    Primary Care Provider Kataleyah Carducci, Camelia Eng, PA-C here for primary care  Current Health Care Team: Dentist, Dr. Mariel Sleet  Eye doctor, Dr. Leonie Man Dr. Silvano Rusk, gastroenterology Dr. Virgina Jock, Dr. Rex Kras, and Binnie Kand nurse practitioner at [facility] cardiovascular Dr. Renda Rolls at dermatology Dr. Radene Knee, [facility] gyne[facility] Dr. Sherre Poot, orthopedics, [facility]  Depression Screen    07/14/2022    8:33 AM  Depression screen PHQ 2/9  Decreased Interest 2  Down, Depressed, Hopeless 2  PHQ - 2 Score 4  Altered sleeping 3  Tired, decreased energy 3  Change in appetite 0  Feeling bad or failure about yourself  0  Trouble concentrating 1  Moving slowly or fidgety/restless 0  Suicidal thoughts 0  PHQ-9 Score 11  Difficult doing work/chores Not difficult at all    Activities of Daily Living Screen/Functional Status Survey Is the patient deaf or have difficulty hearing?: Yes (did go get hearing check- can't afford hearing aids) Does the patient have difficulty seeing, even when wearing glasses/contacts?: No Does the patient have difficulty concentrating, remembering, or making decisions?: Yes Does the patient have difficulty walking or climbing stairs?: Yes (fell in November down stairs) Does the patient have difficulty dressing or bathing?: No Does the patient have difficulty doing errands alone such as visiting a doctor's office or shopping?: No   [place]    07/14/2022    8:32 AM 07/11/2021    8:18 AM 06/25/2021    1:18 PM 01/21/2021   11:08 AM 07/10/2020    8:22 AM  Fall Risk   Falls in the past year? 1 0 0 0 0  Number falls in past yr: 0 0 0 0   Injury with Fall? 1 0 0 0   Risk for fall due to : Impaired balance/gait No Fall Risks No Fall Risks No Fall Risks No Fall Risks  Follow up Falls evaluation  completed Falls evaluation completed Falls evaluation completed Falls evaluation completed Falls evaluation completed    Advanced directives Does patient have a [place]? Yes Does patient have a Living Will? Yes  Past Medical History:  Diagnosis Date   Abnormal screening cardiac CT 11/2011   coronary calcium score 13, 24% percentile, mild plaque in prox LAD and ostial RCA   Adenomatous colon polyp    Dr. Silvano Rusk   Allergy    Anemia    Anxiety    Arthritis    Asthma    Atherosclerosis of aorta ([place]) 03/2014   abnormal on ultrasound, repeat US within 5 years   Cancer [facility])    Skin cancer squamous   Depression    Diverticulosis    Eye injury    in college, some pertinent loss of vision in left eye   Genital herpes    GERD (gastroesophageal reflux disease)    H/O echocardiogram 10/28/2018   LVEF 45-50%, impaired diastolic dysfunction, mild mitral and tricuspid regurgitation; Dr. Virgina Jock   History of bone density study 09/2011   normal   History of mammogram    Dr. Radene Knee   History of melanoma    sees Lavonna Monarch, dermatology   History of PFTs 01/20/2011   moderate airflow limitation, no significant response to bronchodilator   Hyperlipidemia    Hypertension 12/2011   Insomnia    Migraines  Dr. Melton Alar, migraines- PAST HX   MSH6-related Lynch syndrome (HNPCC5) 08/20/2017   Nearsightedness    wears glasses   Osteopenia 2020   Post-operative nausea and vomiting    Routine gynecological examination    Dr. Sharene Butters virus disease 06/2014    Past Surgical History:  Procedure Laterality Date   COLONOSCOPY  2016   Dr. Silvano Rusk, 2014 Dr. Earlean Shawl   COLONOSCOPY  01/2019   weak anal sphincter tone, moderate rectocele, polyp, scattered diverticula; Dr. Silvano Rusk   ESOPHAGOGASTRODUODENOSCOPY  11/2017   normal, Dr. Silvano Rusk   POLYPECTOMY     ROTATOR CUFF REPAIR Right 05/2010   TONSILLECTOMY AND ADENOIDECTOMY     as a kid    [place]   total; abnormal peroids   VARICOSE VEIN SURGERY     bilat     Family History  Problem Relation Age of Onset   COPD Mother        died age 12yo.  emphysema   Colon cancer Father 69        died age 70yo   Colon cancer Brother 61   Breast cancer Maternal Aunt    Colon cancer Paternal Uncle        2   Colon cancer Paternal Grandmother 29       colon cancer    Breast cancer Daughter    Diabetes Neg Hx    Heart disease Neg Hx    Hypertension Neg Hx    Stroke Neg Hx    Rectal cancer Neg Hx    Stomach cancer Neg Hx    Colon polyps Neg Hx    Esophageal cancer Neg Hx      Current Outpatient Medications:    acetaminophen (TYLENOL) 500 MG tablet, 1-2 tablets twice to three times daily as needed., Disp: 100 tablet, Rfl: 5   albuterol (VENTOLIN HFA) 108 (90 Base) MCG/ACT inhaler, , Disp: , Rfl:    alosetron (LOTRONEX) 0.5 MG tablet, Take 1 tablet (0.5 mg total) by mouth 2 (two) times daily., Disp: 60 tablet, Rfl: 11   aspirin EC 81 MG tablet, Take 1 tablet (81 mg total) by mouth daily., Disp: 90 tablet, Rfl: 3   cetirizine (ZYRTEC) 10 MG tablet, Take 10 mg by mouth daily., Disp: , Rfl:    cholecalciferol (VITAMIN D3) 25 MCG (1000 UNIT) tablet, Take 1 tablet (1,000 Units total) by mouth daily., Disp: 90 tablet, Rfl: 3   ciprofloxacin (CIPRO) 500 MG tablet, Take 1 tablet (500 mg total) by mouth 2 (two) times daily for 10 days., Disp: 20 tablet, Rfl: 0   FLUoxetine (PROZAC) 10 MG tablet, Take 1 tablet (10 mg total) by mouth daily., Disp: 90 tablet, Rfl: 0   Multiple Vitamin (MULTIVITAMIN ADULT PO), Multivitamin 50 Plus, Disp: , Rfl:    olmesartan (BENICAR) 20 MG tablet, Take 1 tablet (20 mg total) by mouth every evening., Disp: 30 tablet, Rfl: 2   Probiotic Product (PRO-BIOTIC BLEND PO), Take 1 tablet by mouth daily., Disp: , Rfl:    scopolamine (TRANSDERM-SCOP) 1 MG/3DAYS, Place 1 patch (1.5 mg total) onto the skin every 3 (three) days.,  Disp: 5 patch, Rfl: 0   zolpidem (AMBIEN) 10 MG tablet, TAKE 1/2 TO 1 TABLET AT BEDTIME, Disp: 90 tablet, Rfl: 0   acyclovir (ZOVIRAX) 200 MG capsule, TAKE 2 CAPSULES BY MOUTH 3 TIMES A DAY FOR 5 DAYS AS NEEDED FOR FLARE UPS (Patient not taking:  Reported on 05/12/2022), Disp: , Rfl:    atorvastatin (LIPITOR) 40 MG tablet, Take 1 tablet (40 mg total) by mouth daily., Disp: 90 tablet, Rfl: 3  Allergies  Allergen Reactions   Sulfonamide Derivatives Shortness Of Breath and Swelling   Mucinex [Guaifenesin Er] Other (See Comments)    "Jittery and hypes me up"    History reviewed: allergies, current medications, past family history, past medical history, past social history, past surgical history and problem list  Chronic issues discussed: Asthma-no recent concerns or problems  Hyperlipidemia-compliant with statin  Hypertension-had been off medication since 11/2021 per GI recommendation, but just started back on Olmesartan last week.   Insomnia-continues on Ambien without problem  Still dealing with the long process of recovery after recent left elbow /arm fracture.    Going on a long cruise in April from [place] to [place]  She is dealing with a lot of anxiety, fear of falling.   Her recent fracture really got her more anxious.   Acute issues discussed: Skin lesion over right eye getting bigger.  Her dermatologist is deferring to a specialist.    Objective:      Biometrics BP 110/68   Pulse 87   Ht 5' 6.5" (1.689 m)   Wt 174 lb 12.8 oz (79.3 kg)   BMI 27.79 kg/m   Wt Readings from Last 3 Encounters:  07/14/22 174 lb 12.8 oz (79.3 kg)  05/12/22 178 lb 6.4 oz (80.9 kg)  04/06/22 170 lb (77.1 kg)    Cognitive Testing  Alert? Yes  Normal Appearance?Yes  Oriented to person? Yes  Place? Yes   Time? Yes  Recall of three objects?  Yes  Can perform simple calculations? Yes  Displays appropriate judgment?Yes  Can read the correct time from a watch face?Yes  General appearance:  alert, no distress, WD/WN, white female  Nutritional Status: Inadequate calore intake? no Loss of muscle mass? some Loss of fat beneath skin? no Localized or general edema? no Diminished functional status? some  Other pertinent exam: HEENT: normocephalic, sclerae anicteric Neck: supple, no lymphadenopathy, no thyromegaly, no masses, no bruits Heart: RRR, normal S1, S2, no murmurs Lungs: CTA bilaterally, no wheezes, rhonchi, or rales Abdomen: +bs, soft, non tender, non distended, no masses, no hepatomegaly, no splenomegaly Musculoskeletal: surgical scar left posterior elbow, guarded with this elbow and arm, +contracture, mild of right hand volar surface affecting 3rd and 4th digits along mid to distal metacarpals, no flexion deformity, otherwise UE and LE nontender, no swelling, no obvious deformity Extremities: moderate varicose veins bilat LE, no edema, no cyanosis, no clubbing Pulses: 2+ symmetric, upper and lower extremities, normal cap refill Neurological: alert, oriented x 3, CN2-12 intact, strength normal upper extremities and lower extremities, sensation normal throughout, DTRs 2+ throughout, no cerebellar signs, gait normal Psychiatric: normal affect, behavior normal, pleasant  GU/rectal/breast - deferred to gynecology Skin : scattered macules, oval 1.3 cm x 18m verrrucal raised appearing lesion of right upper outer orbit, raised verruca lesion medical right ankle   Nuclear stress test September 2021 Nondiagnostic ECG stress. There is a fixed moderate defect in the inferior and apical regions suggestive of soft tissue attenuation without ischemia or scar.  Overall LV systolic function is normal without regional wall motion abnormalities. Stress LV EF: 61%.  No previous exam available for comparison. Low risk. Dr. JAdrian Prows Echocardiogram 12/2019 1. Left ventricle cavity is normal in size. Mild concentric hypertrophy of the left ventricle. Mildly global hypokinesis. LVEF  45-50%. Normal global  wall motion. Doppler evidence of grade I (impaired) diastolic dysfunction, normal LAP. 2. Mild (Grade I) mitral regurgitation. 3. Mild tricuspid regurgitation. 4. No evidence of pulmonary hypertension. 5. No significant change compared to previous study on 10/28/2018. Conclusions: Vernell Leep, MD   Assessment:   Encounter Diagnoses  Name Primary?   Encounter for health maintenance examination in adult Yes   At moderate risk for fall    Essential hypertension    Atherosclerosis of abdominal aorta (HCC)    Varicose veins of both lower extremities, unspecified whether complicated    Mild intermittent asthma without complication    Irritable bowel syndrome with diarrhea    Recurrent cold sores    Osteopenia, unspecified location    Vitamin D deficiency    Vaccine counseling    Post-menopausal    Lynch syndrome    Insomnia, unspecified type    Generalized anxiety disorder    Dyslipidemia    Unsteady gait    Travel advice encounter       Plan:   A preventative services visit was completed today.  During the course of the visit today, we discussed and counseled about appropriate screening and preventive services.  A health risk assessment was established today that included a review of current medications, allergies, social history, family history, medical and preventative health history, biometrics, and preventative screenings to identify potential safety concerns or impairments.  A personalized plan was printed today for your records and use.   Personalized health advice and education was given today to reduce health risks and promote self management and wellness.  Information regarding end of life planning was discussed today.   Recommendations: Continue to return yearly for your annual wellness and preventative care visits.  This gives [place] a chance to discuss healthy lifestyle, exercise, vaccinations, review your chart record, and perform screenings  where appropriate.  I recommend you see your eye doctor yearly for routine vision care.  I recommend you see your dentist yearly for routine dental care including hygiene visits twice yearly.  See your gynecologist yearly for routine gynecological care.   Vaccination recommendations were reviewed Immunization History  Administered Date(s) Administered   COVID-19, mRNA, vaccine(Comirnaty)12 years and older 07/14/2022   Fluad Quad(high Dose 65+) 02/24/2020, 01/28/2022   Hep B, Unspecified 10/09/2017   Hepatitis A 12/09/2017   Hepatitis A, Adult 10/09/2017   Hepatitis B 10/09/2017, 12/09/2017   Influenza Split 02/16/2011, 02/11/2021   Influenza Whole 02/21/2013   Influenza-Unspecified 02/20/2014, 02/20/2015, 01/26/2017, 02/08/2018, 02/17/2019   Meningococcal Conjugate 06/12/2017   PFIZER(Purple Top)SARS-COV-2 Vaccination 06/15/2019, 07/10/2019, 01/26/2020   Pfizer Covid-19 Vaccine Bivalent Booster 96yr & up 08/16/2020   Pneumococcal Conjugate-13 05/23/2015   Pneumococcal Polysaccharide-23 02/03/2012, 07/07/2018   Pneumococcal-Unspecified 06/12/2017   Td 07/18/2007   Tdap 01/16/2011, 05/11/2017   Varicella 04/10/2012   Zoster Recombinat (Shingrix) 12/15/2017, 02/13/2018   Zoster, Live 03/18/2011, 12/09/2017   Counseled on the Covid virus vaccine.  Vaccine information sheet given.  Covid vaccine given after consent obtained.   Screening for cancer: Breast cancer screening: You should perform a self breast exam monthly.   We reviewed recommendations for regular mammograms and breast cancer screening.  Colon cancer screening:  I reviewed your colonoscopy 2023.  Plan repeat this 09/2022 given hx/o Lynch Syndrome  Skin cancer screening: Check your skin regularly for new changes, growing lesions, or other lesions of concern Continue routine follow-up with dermatology  Lung cancer screening: If you have a greater than 30 pack year history of tobacco use,  then you qualify for  lung cancer screening with a chest CT scan  We currently don't have screenings for other cancers besides breast, cervical, colon, and lung cancers.  If you have a strong family history of cancer or have other cancer screening concerns, please let me know.    Bone health: Get at least 150 minutes of aerobic exercise weekly Get weight bearing exercise at least once weekly 2022 bone density test showed osteopenia.      Heart health: Get at least 150 minutes of aerobic exercise weekly Limit alcohol It is important to maintain a healthy blood pressure and healthy cholesterol numbers  She sees cardiology.  I reviewed 2021 nuclear stress test and echo as noted above in exam.    Separate significant issues discussed: Anxiety - begin trial of Fluoxetine.   Referral to PT to help with fall risk and balance  Travel advice - prescribed scopolamine patch and cipro for travel medicaiton as we discussed.  Aortic atherosclerosis, hyperlipidemia-continue statin, labs today  hypertension - continue current medication just recently started  Osteopenia-continue vitamin D supplement, get weightbearing exercise and aerobic activity  Actinic keratosis-continue routine surveillance with dermatology  Varicose veins- consider compression hose.  Continue regular exercise  Insomnia-uses Ambien without any major issues.  Does fine on this.  She is aware of risk versus benefits  Asthma-doing fine on albuterol as needed.    Allergic rhinitis-continue zyrtec  IBS-on medication, managed by GI  Cold sores, genital herpes -  uses acyclovir as needed   Christabell was seen today for fasting cpe.  Diagnoses and all orders for this visit:  Encounter for health maintenance examination in adult -     Lipid panel -     Comprehensive metabolic panel -     TSH -     POCT Urinalysis DIP (Proadvantage Device) -     VITAMIN D 25 Hydroxy (Vit-D Deficiency, Fractures)  At moderate risk for fall -     Ambulatory  referral to Physical Therapy  Essential hypertension  Atherosclerosis of abdominal aorta (HCC)  Varicose veins of both lower extremities, unspecified whether complicated  Mild intermittent asthma without complication  Irritable bowel syndrome with diarrhea  Recurrent cold sores  Osteopenia, unspecified location  Vitamin D deficiency -     VITAMIN D 25 Hydroxy (Vit-D Deficiency, Fractures)  Vaccine counseling -     Pfizer Fall 2023 Covid-19 Vaccine 42yr and older  Post-menopausal  Lynch syndrome  Insomnia, unspecified type  Generalized anxiety disorder  Dyslipidemia -     Lipid panel  Unsteady gait -     Ambulatory referral to Physical Therapy  Travel advice encounter  Other orders -     FLUoxetine (PROZAC) 10 MG tablet; Take 1 tablet (10 mg total) by mouth daily. -     scopolamine (TRANSDERM-SCOP) 1 MG/3DAYS; Place 1 patch (1.5 mg total) onto the skin every 3 (three) days. -     ciprofloxacin (CIPRO) 500 MG tablet; Take 1 tablet (500 mg total) by mouth 2 (two) times daily for 10 days. -     atorvastatin (LIPITOR) 40 MG tablet; Take 1 tablet (40 mg total) by mouth daily. -     aspirin EC 81 MG tablet; Take 1 tablet (81 mg total) by mouth daily.    Follow up pending labs

## 2022-07-15 ENCOUNTER — Other Ambulatory Visit: Payer: Self-pay | Admitting: Medical

## 2022-07-15 DIAGNOSIS — I10 Essential (primary) hypertension: Secondary | ICD-10-CM

## 2022-07-15 DIAGNOSIS — M25572 Pain in left ankle and joints of left foot: Secondary | ICD-10-CM | POA: Diagnosis not present

## 2022-07-15 DIAGNOSIS — S52022D Displaced fracture of olecranon process without intraarticular extension of left ulna, subsequent encounter for closed fracture with routine healing: Secondary | ICD-10-CM | POA: Diagnosis not present

## 2022-07-15 LAB — LIPID PANEL
Chol/HDL Ratio: 2.2 ratio (ref 0.0–4.4)
Cholesterol, Total: 173 mg/dL (ref 100–199)
HDL: 78 mg/dL (ref >39)
LDL Chol Calc (NIH): 81 mg/dL (ref 0–99)
Triglycerides: 76 mg/dL (ref 0–149)
VLDL Cholesterol Cal: 14 mg/dL (ref 5–40)

## 2022-07-15 LAB — COMPREHENSIVE METABOLIC PANEL
ALT: 14 IU/L (ref 0–32)
AST: 21 IU/L (ref 0–40)
Albumin/Globulin Ratio: 2.5 — ABNORMAL HIGH (ref 1.2–2.2)
Albumin: 4.7 g/dL (ref 3.8–4.8)
Alkaline Phosphatase: 82 IU/L (ref 44–121)
BUN/Creatinine Ratio: 29 — ABNORMAL HIGH (ref 12–28)
BUN: 25 mg/dL (ref 8–27)
Bilirubin Total: 0.5 mg/dL (ref 0.0–1.2)
CO2: 23 mmol/L (ref 20–29)
Calcium: 10 mg/dL (ref 8.7–10.3)
Chloride: 105 mmol/L (ref 96–106)
Creatinine, Ser: 0.86 mg/dL (ref 0.57–1.00)
Globulin, Total: 1.9 g/dL (ref 1.5–4.5)
Glucose: 101 mg/dL — ABNORMAL HIGH (ref 70–99)
Potassium: 4.6 mmol/L (ref 3.5–5.2)
Sodium: 140 mmol/L (ref 134–144)
Total Protein: 6.6 g/dL (ref 6.0–8.5)
eGFR: 72 mL/min/{1.73_m2} (ref >59)

## 2022-07-15 LAB — TSH: TSH: 1.79 u[IU]/mL (ref 0.450–4.500)

## 2022-07-15 LAB — VITAMIN D 25 HYDROXY (VIT D DEFICIENCY, FRACTURES): Vit D, 25-Hydroxy: 57.1 ng/mL (ref 30.0–100.0)

## 2022-07-15 MED ORDER — OLMESARTAN MEDOXOMIL 20 MG PO TABS: 20.0000 mg | ORAL_TABLET | Freq: Every evening | ORAL | 3 refills | Status: DC

## 2022-07-15 NOTE — Progress Notes (Signed)
Results sent through MyChart

## 2022-07-30 DIAGNOSIS — M6281 Muscle weakness (generalized): Secondary | ICD-10-CM | POA: Diagnosis not present

## 2022-07-30 DIAGNOSIS — R262 Difficulty in walking, not elsewhere classified: Secondary | ICD-10-CM | POA: Diagnosis not present

## 2022-07-30 DIAGNOSIS — R296 Repeated falls: Secondary | ICD-10-CM | POA: Diagnosis not present

## 2022-08-05 ENCOUNTER — Telehealth: Payer: Self-pay | Admitting: Medical

## 2022-08-05 NOTE — Telephone Encounter (Signed)
Pt left message and states she is going to [place] for month in April & needs early refill of Ambien

## 2022-08-06 ENCOUNTER — Ambulatory Visit (INDEPENDENT_AMBULATORY_CARE_PROVIDER_SITE_OTHER): Payer: PPO | Admitting: Plastic Surgery

## 2022-08-06 ENCOUNTER — Encounter: Payer: Self-pay | Admitting: Plastic Surgery

## 2022-08-06 VITALS — BP 167/112 | HR 78 | Ht 68.0 in | Wt 172.6 lb

## 2022-08-06 DIAGNOSIS — L989 Disorder of the skin and subcutaneous tissue, unspecified: Secondary | ICD-10-CM

## 2022-08-06 NOTE — Telephone Encounter (Signed)
Left detailed message that she should have enough until begin of May

## 2022-08-06 NOTE — Progress Notes (Signed)
Referring Provider Tysinger, Camelia Eng, PA-C [address] [place],  [place] [address]   CC:  Chief Complaint  Patient presents with   Consult      Shelby Randolph is an 72 y.o. female.  HPI: Shelby Randolph is a 72 year old female who presents today for evaluation of a skin lesion above her right eye.  Patient has been present for well over a year but over the past 2 months has started to increase in size.  She would like to have it removed for pathologic diagnosis.  Allergies  Allergen Reactions   Sulfonamide Derivatives Shortness Of Breath and Swelling   Mucinex [Guaifenesin Er] Other (See Comments)    "Jittery and hypes me up"    Outpatient Encounter Medications as of 08/06/2022  Medication Sig Note   acetaminophen (TYLENOL) 500 MG tablet 1-2 tablets twice to three times daily as needed.    acyclovir (ZOVIRAX) 200 MG capsule     albuterol (VENTOLIN HFA) 108 (90 Base) MCG/ACT inhaler  05/12/2022: prn   alosetron (LOTRONEX) 0.5 MG tablet Take 1 tablet (0.5 mg total) by mouth 2 (two) times daily.    aspirin EC 81 MG tablet Take 1 tablet (81 mg total) by mouth daily.    atenolol (TENORMIN) 25 MG tablet 1 tablet Orally Once a day    atorvastatin (LIPITOR) 40 MG tablet Take 1 tablet (40 mg total) by mouth daily.    cetirizine (ZYRTEC) 10 MG tablet Take 10 mg by mouth daily.    cholecalciferol (VITAMIN D3) 25 MCG (1000 UNIT) tablet Take 1 tablet (1,000 Units total) by mouth daily.    FLUoxetine (PROZAC) 10 MG tablet Take 1 tablet (10 mg total) by mouth daily.    Multiple Vitamin (MULTIVITAMIN ADULT PO) Multivitamin 50 Plus    olmesartan (BENICAR) 20 MG tablet Take 1 tablet (20 mg total) by mouth every evening.    Probiotic Product (PRO-BIOTIC BLEND PO) Take 1 tablet by mouth daily.    scopolamine (TRANSDERM-SCOP) 1 MG/3DAYS Place 1 patch (1.5 mg total) onto the skin every 3 (three) days.    zolpidem (AMBIEN) 10 MG tablet TAKE 1/2 TO 1 TABLET AT BEDTIME    No facility-administered  encounter medications on file as of 08/06/2022.     Past Medical History:  Diagnosis Date   Abnormal screening cardiac CT 11/2011   coronary calcium score 13, 24% percentile, mild plaque in prox LAD and ostial RCA   Adenomatous colon polyp    Dr. Silvano Rusk   Allergy    Anemia    Anxiety    Arthritis    Asthma    Atherosclerosis of aorta ([place]) 03/2014   abnormal on ultrasound, repeat US within 5 years   Cancer [facility])    Skin cancer squamous   Depression    Diverticulosis    Eye injury    in college, some pertinent loss of vision in left eye   Genital herpes    GERD (gastroesophageal reflux disease)    H/O echocardiogram 10/28/2018   LVEF 45-50%, impaired diastolic dysfunction, mild mitral and tricuspid regurgitation; Dr. Virgina Jock   History of bone density study 09/2011   normal   History of mammogram    Dr. Radene Knee   History of melanoma    sees Lavonna Monarch, dermatology   History of PFTs 01/20/2011   moderate airflow limitation, no significant response to bronchodilator   Hyperlipidemia    Hypertension 12/2011   Insomnia    Migraines    Dr.  Lewitt, migraines- PAST HX   MSH6-related Lynch syndrome (HNPCC5) 08/20/2017   Nearsightedness    wears glasses   Osteopenia 2020   Post-operative nausea and vomiting    Routine gynecological examination    Dr. Sharene Butters virus disease 06/2014    Past Surgical History:  Procedure Laterality Date   COLONOSCOPY  2016   Dr. Silvano Rusk, 2014 Dr. Earlean Shawl   COLONOSCOPY  01/2019   weak anal sphincter tone, moderate rectocele, polyp, scattered diverticula; Dr. Silvano Rusk   ESOPHAGOGASTRODUODENOSCOPY  11/2017   normal, Dr. Silvano Rusk   POLYPECTOMY     ROTATOR CUFF REPAIR Right 05/2010   TONSILLECTOMY AND ADENOIDECTOMY     as a kid   [place]   total; abnormal peroids   VARICOSE VEIN SURGERY     bilat    Family History  Problem Relation Age of Onset   COPD Mother         died age 65yo.  emphysema   Colon cancer Father 76        died age 62yo   Colon cancer Brother 63   Breast cancer Maternal Aunt    Colon cancer Paternal Uncle        2   Colon cancer Paternal Grandmother 3       colon cancer    Breast cancer Daughter    Diabetes Neg Hx    Heart disease Neg Hx    Hypertension Neg Hx    Stroke Neg Hx    Rectal cancer Neg Hx    Stomach cancer Neg Hx    Colon polyps Neg Hx    Esophageal cancer Neg Hx     Social History   Social History Narrative   Lives alone, has 5 children ([place], [place], [place]), has several grandchildren, daughter Junie Panning in [place] in the [employer], exercises 5 days per week - walking, kickboxing, yoga; [employer];  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  Physical Exam    08/06/2022    9:19 AM 07/14/2022    8:38 AM 05/12/2022   10:37 AM  Vitals with BMI  Height 5\' 8"  5' 6.5"   Weight 172 lbs 10 oz 174 lbs 13 oz 178 lbs 6 oz  BMI [id number]   Systolic [id number]  Diastolic [id number] 68 80  Pulse 78 87 82    General:  No acute distress,  Alert and oriented, Non-Toxic, Normal speech and affect Integument: 1 cm skin lesion above the right eye with a verrucous appearance.  The lesion is not attached to the underlying structures. Mammogram: Not applicable Assessment/Plan Skin lesion: Given the recent increase in growth I think it is very reasonable to remove this lesion for pathologic diagnosis.  Will do this in the office under local.  She understands there is a risk of recurrence depending on what the pathology is.  She also understands that she may need further surgical intervention depending on the pathology.  I will use nonabsorbable sutures which will need to be removed 5  to 7 days postoperatively.  Will schedule at her request  [name] 08/06/2022, 10:02 AM

## 2022-08-12 ENCOUNTER — Other Ambulatory Visit: Payer: Self-pay | Admitting: Internal Medicine

## 2022-08-12 NOTE — Progress Notes (Signed)
Pt was notified.  

## 2022-08-12 NOTE — Progress Notes (Signed)
Pt is not taking blood pressures at home. She got off the prozac as it made her hyper. She no longer taking the atenolol as she said she was told to stop it. She is taking the benicar. She leaves tomorrow for her trip

## 2022-09-02 DIAGNOSIS — S52022D Displaced fracture of olecranon process without intraarticular extension of left ulna, subsequent encounter for closed fracture with routine healing: Secondary | ICD-10-CM | POA: Diagnosis not present

## 2022-09-07 ENCOUNTER — Ambulatory Visit (INDEPENDENT_AMBULATORY_CARE_PROVIDER_SITE_OTHER): Payer: PPO | Admitting: Medical

## 2022-09-07 VITALS — BP 122/80 | HR 84 | Temp 97.7°F | Wt 172.0 lb

## 2022-09-07 DIAGNOSIS — J988 Other specified respiratory disorders: Secondary | ICD-10-CM

## 2022-09-07 DIAGNOSIS — R21 Rash and other nonspecific skin eruption: Secondary | ICD-10-CM

## 2022-09-07 DIAGNOSIS — J3489 Other specified disorders of nose and nasal sinuses: Secondary | ICD-10-CM | POA: Diagnosis not present

## 2022-09-07 LAB — POC COVID19 BINAXNOW: SARS Coronavirus 2 Ag: NEGATIVE

## 2022-09-07 MED ORDER — TRIAMCINOLONE ACETONIDE 0.1 % EX CREA: 1.0000 | TOPICAL_CREAM | Freq: Two times a day (BID) | CUTANEOUS | 0 refills | Status: DC

## 2022-09-07 MED ORDER — OLOPATADINE HCL 0.2 % OP SOLN: 1.0000 [drp] | Freq: Two times a day (BID) | OPHTHALMIC | 0 refills | Status: DC

## 2022-09-07 MED ORDER — AMOXICILLIN 875 MG PO TABS: 875.0000 mg | ORAL_TABLET | Freq: Two times a day (BID) | ORAL | 0 refills | Status: DC
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
## 2022-09-07 NOTE — Addendum Note (Signed)
Addended by: Herminio Commons A on: 09/07/2022 11:00 AM   Modules accepted: Orders

## 2022-09-09 ENCOUNTER — Ambulatory Visit (AMBULATORY_SURGERY_CENTER): Payer: PPO

## 2022-09-09 ENCOUNTER — Encounter: Payer: Self-pay | Admitting: Internal Medicine

## 2022-09-09 VITALS — Ht 68.0 in | Wt 165.0 lb

## 2022-09-09 DIAGNOSIS — Z800 Family history of malignant neoplasm of digestive organs: Secondary | ICD-10-CM

## 2022-09-09 DIAGNOSIS — Z86010 Personal history of colonic polyps: Secondary | ICD-10-CM

## 2022-09-09 DIAGNOSIS — Z1509 Genetic susceptibility to other malignant neoplasm: Secondary | ICD-10-CM

## 2022-09-09 NOTE — Progress Notes (Signed)
Pre visit completed via phone call; Patient verified name, DOB, and address;  No egg or soy allergy known to patient;  No issues known to pt with past sedation with any surgeries or procedures; Patient denies ever being told they had issues or difficulty with intubation;  No FH of Malignant Hyperthermia; Pt is not on diet pills; Pt is not on home 02;  Pt is not on blood thinners;  Pt denies issues with constipation;  No A fib or A flutter; Have any cardiac testing pending--NO Pt instructed to use [email or web address] or GoodRx for a price reduction on prep;   Insurance verified during PV appt=HTA  Patient's chart reviewed by Cathlyn Parsons CNRA prior to previsit and patient appropriate for the [facility].  Previsit completed and red dot placed by patient's name on their procedure day (on provider's schedule).    YQM:VHQIONG reports she has had 3 days of excruciating back pain, nausea, vomiting, and diarrhea on 3 different occasions, patient reports all testing done at PCP came back negative- patient is wondering if these symptoms are related to diverticulitis or losis; patient reports no diet changes or food poisoning knowledge, no change in smell of bowel movements- "it is just 3 different times this has happened and the first time was just awful"-patient is requesting to know if this could be bowel/colon related;  Dr.Gessner-please speak with patient prior to procedure as she has requested this;

## 2022-09-11 ENCOUNTER — Other Ambulatory Visit: Payer: Self-pay | Admitting: Medical

## 2022-09-17 ENCOUNTER — Ambulatory Visit (INDEPENDENT_AMBULATORY_CARE_PROVIDER_SITE_OTHER): Payer: PPO | Admitting: Plastic Surgery

## 2022-09-17 VITALS — BP 157/88 | HR 73 | Resp 18

## 2022-09-17 DIAGNOSIS — L820 Inflamed seborrheic keratosis: Secondary | ICD-10-CM | POA: Diagnosis not present

## 2022-09-17 DIAGNOSIS — L989 Disorder of the skin and subcutaneous tissue, unspecified: Secondary | ICD-10-CM

## 2022-09-17 NOTE — Progress Notes (Signed)
Procedure Note  Preoperative Dx: Skin lesion, right upper eyelid  Postoperative Dx: Same  Procedure: Excision of 1 cm skin lesion  Anesthesia: Lidocaine 1% with 1:100,000 epinephrine and 0.25% Sensorcaine   Indication for Procedure: Removal for pathologic diagnosis  Description of Procedure: Risks and complications were explained to the patient including scarring distortion of the upper eyelid and need for additional procedures.  Consent was confirmed and the patient understands the risks and benefits.  The potential complications and alternatives were explained and the patient consents.  The patient expressed understanding the option of not having the procedure and the risks of a scar.  Time out was called and all information was confirmed to be correct.    The area was prepped and drapped.  Local anesthetic was injected in the subcutaneous tissues.  After waiting for the local to take affect an elliptical incision was made around the base of the lesion excising the entire lesion down to the subcutaneous tissues.  After obtaining hemostasis, the surgical wound was closed with interrupted 5-0 Prolene sutures.  The surgical wound measured 1 cm.  A dressing was applied.  The patient was given instructions on how to care for the area and a follow up appointment.  Shelby Randolph tolerated the procedure well and there were no complications. The specimen was sent to pathology.

## 2022-09-18 ENCOUNTER — Ambulatory Visit (INDEPENDENT_AMBULATORY_CARE_PROVIDER_SITE_OTHER): Payer: PPO | Admitting: Medical

## 2022-09-18 VITALS — BP 136/90 | HR 68 | Wt 172.2 lb

## 2022-09-18 DIAGNOSIS — I10 Essential (primary) hypertension: Secondary | ICD-10-CM

## 2022-09-18 MED ORDER — OLMESARTAN MEDOXOMIL 40 MG PO TABS: 40.0000 mg | ORAL_TABLET | Freq: Every day | ORAL | 3 refills | Status: DC

## 2022-09-18 NOTE — Progress Notes (Signed)
Subjective:  Shelby Randolph is a 72 y.o. female who presents for Chief Complaint  Patient presents with   blood pressure running high    Bp was 156/81, 157/88     Here for recheck on blood pressure.  She went to plastic surgery yesterday to have a procedure to remove some skin on the eyelid.  Her pressure was high at that visit so they told her to follow-up here.  She has a blood pressure cuff at home but is not checking her blood pressure at home regularly.  Currently on olmesartan 20 mg daily.  She was on atenolol a few years ago but started having low blood pressures so this was discontinued.  She has also been on hydrochlorothiazide in recent years including last year she has had a lot of diarrhea.  That resolved when she quit taking hydrochlorothiazide.  She currently has no symptoms of concern  No chest pain, palpitations, edema  Exercising some with walking   No other aggravating or relieving factors.    No other c/o.  Past Medical History:  Diagnosis Date   Abnormal screening cardiac CT 11/2011   coronary calcium score 13, 24% percentile, mild plaque in prox LAD and ostial RCA   Adenomatous colon polyp    Dr. Stan Head   Allergy    Anemia    Anxiety    Arthritis    Asthma    Atherosclerosis of aorta (HCC) 03/2014   abnormal on ultrasound, repeat US within 5 years   Cancer [facility])    Skin cancer squamous   Depression    Diverticulosis    Eye injury    in college, some pertinent loss of vision in left eye   Genital herpes    GERD (gastroesophageal reflux disease)    H/O echocardiogram 10/28/2018   LVEF 45-50%, impaired diastolic dysfunction, mild mitral and tricuspid regurgitation; Dr. Rosemary Holms   History of bone density study 09/2011   normal   History of mammogram    Dr. Arelia Sneddon   History of melanoma    sees Carollee Herter, dermatology   History of PFTs 01/20/2011   moderate airflow limitation, no significant response to bronchodilator   Hyperlipidemia     Hypertension 12/2011   Insomnia    Migraines    Dr. Vela Prose, migraines- PAST HX   MSH6-related Lynch syndrome (HNPCC5) 08/20/2017   Nearsightedness    wears glasses   Osteopenia 2020   Post-operative nausea and vomiting    Routine gynecological examination    Dr. Mariea Stable virus disease 06/2014   Current Outpatient Medications on File Prior to Visit  Medication Sig Dispense Refill   acetaminophen (TYLENOL) 500 MG tablet 1-2 tablets twice to three times daily as needed. 100 tablet 5   acyclovir (ZOVIRAX) 200 MG capsule Take 200 mg by mouth daily as needed.     albuterol (VENTOLIN HFA) 108 (90 Base) MCG/ACT inhaler Inhale 2 puffs into the lungs every 6 (six) hours as needed for wheezing or shortness of breath.     alosetron (LOTRONEX) 0.5 MG tablet Take 1 tablet (0.5 mg total) by mouth 2 (two) times daily. 60 tablet 11   aspirin EC 81 MG tablet Take 1 tablet (81 mg total) by mouth daily. 90 tablet 3   atorvastatin (LIPITOR) 40 MG tablet Take 1 tablet (40 mg total) by mouth daily. 90 tablet 3   cetirizine (ZYRTEC) 10 MG tablet Take 10 mg by mouth daily.     cholecalciferol (VITAMIN  D3) 25 MCG (1000 UNIT) tablet Take 1 tablet (1,000 Units total) by mouth daily. 90 tablet 3   Multiple Vitamin (MULTIVITAMIN ADULT PO) Take 1 tablet by mouth daily.     Olopatadine HCl (PATADAY) 0.2 % SOLN Apply 1 drop to eye in the morning and at bedtime. 2.5 mL 0   ondansetron (ZOFRAN) 4 MG tablet Take 4 mg by mouth every 8 (eight) hours as needed for nausea, vomiting or refractory nausea / vomiting.     Probiotic Product (PRO-BIOTIC BLEND PO) Take 1 tablet by mouth daily.     Sunscreens (SUNBLOCK SPF 30 EX) Apply 1 Application topically daily.     triamcinolone cream (KENALOG) 0.1 % Apply 1 Application topically 2 (two) times daily. (Patient taking differently: Apply 1 Application topically 2 (two) times daily as needed (rash).) 30 g 0   zolpidem (AMBIEN) 10 MG tablet TAKE 1/2 TO 1 TABLET AT BEDTIME 90  tablet 0   No current facility-administered medications on file prior to visit.     The following portions of the patient's history were reviewed and updated as appropriate: allergies, current medications, past family history, past medical history, past social history, past surgical history and problem list.  ROS Otherwise as in subjective above  Objective: BP (!) 130/90   Pulse 68   Wt 172 lb 3.2 oz (78.1 kg)   BMI 26.18 kg/m   BP Readings from Last 3 Encounters:  09/18/22 (!) 130/90  09/17/22 (!) 157/88  09/07/22 122/80   Wt Readings from Last 3 Encounters:  09/18/22 172 lb 3.2 oz (78.1 kg)  09/09/22 165 lb (74.8 kg)  09/07/22 172 lb (78 kg)    General appearance: alert, no distress, well developed, well nourished Neck: supple, no lymphadenopathy, no thyromegaly, no masses Heart: RRR, normal S1, S2, no murmurs Pulses: 2+ radial pulses, 2+ pedal pulses, normal cap refill Ext: no edema   Assessment: Encounter Diagnosis  Name Primary?   Essential hypertension Yes     Plan: High blood pressure We discussed other medication options including medicine she has been on before We discussed her chart record in the last few months have shown normal readings here and sometimes even lower than average readings.  She had a high reading yesterday and environment where she was nervous and had a procedure done Continue to hydrate well and limit salt Advised to check and monitor blood pressures at home periodically Goal less than 130/80, preferably pressure should be around 120/70 Increase to Olmesartan 40mg  daily  Shelby Randolph was seen today for blood pressure running high.  Diagnoses and all orders for this visit:  Essential hypertension -     Basic metabolic panel; Future  Other orders -     olmesartan (BENICAR) 40 MG tablet; Take 1 tablet (40 mg total) by mouth daily.    Follow up: MyChart some blood pressure readings within the next 2 weeks

## 2022-09-18 NOTE — Patient Instructions (Signed)
Hypertension, Adult ?Hypertension is another name for high blood pressure. High blood pressure forces your heart to work harder to pump blood. This can cause problems over time. ?There are two numbers in a blood pressure reading. There is a top number (systolic) over a bottom number (diastolic). It is best to have a blood pressure that is below 120/80. ?What are the causes? ?The cause of this condition is not known. Some other conditions can lead to high blood pressure. ?What increases the risk? ?Some lifestyle factors can make you more likely to develop high blood pressure: ?Smoking. ?Not getting enough exercise or physical activity. ?Being overweight. ?Having too much fat, sugar, calories, or salt (sodium) in your diet. ?Drinking too much alcohol. ?Other risk factors include: ?Having any of these conditions: ?Heart disease. ?Diabetes. ?High cholesterol. ?Kidney disease. ?Obstructive sleep apnea. ?Having a family history of high blood pressure and high cholesterol. ?Age. The risk increases with age. ?Stress. ?What are the signs or symptoms? ?High blood pressure may not cause symptoms. Very high blood pressure (hypertensive crisis) may cause: ?Headache. ?Fast or uneven heartbeats (palpitations). ?Shortness of breath. ?Nosebleed. ?Vomiting or feeling like you may vomit (nauseous). ?Changes in how you see. ?Very bad chest pain. ?Feeling dizzy. ?Seizures. ?How is this treated? ?This condition is treated by making healthy lifestyle changes, such as: ?Eating healthy foods. ?Exercising more. ?Drinking less alcohol. ?Your doctor may prescribe medicine if lifestyle changes do not help enough and if: ?Your top number is above 130. ?Your bottom number is above 80. ?Your personal target blood pressure may vary. ?Follow these instructions at home: ?Eating and drinking ? ?If told, follow the DASH eating plan. To follow this plan: ?Fill one half of your plate at each meal with fruits and vegetables. ?Fill one fourth of your plate  at each meal with whole grains. Whole grains include whole-wheat pasta, brown rice, and whole-grain bread. ?Eat or drink low-fat dairy products, such as skim milk or low-fat yogurt. ?Fill one fourth of your plate at each meal with low-fat (lean) proteins. Low-fat proteins include fish, chicken without skin, eggs, beans, and tofu. ?Avoid fatty meat, cured and processed meat, or chicken with skin. ?Avoid pre-made or processed food. ?Limit the amount of salt in your diet to less than 1,500 mg each day. ?Do not drink alcohol if: ?Your doctor tells you not to drink. ?You are pregnant, may be pregnant, or are planning to become pregnant. ?If you drink alcohol: ?Limit how much you have to: ?0-1 drink a day for women. ?0-2 drinks a day for men. ?Know how much alcohol is in your drink. In the [place] one drink equals one 12 oz bottle of beer (355 mL), one 5 oz glass of wine (148 mL), or one 1? oz glass of hard liquor (44 mL). ?Lifestyle ? ?Work with your doctor to stay at a healthy weight or to lose weight. Ask your doctor what the best weight is for you. ?Get at least 30 minutes of exercise that causes your heart to beat faster (aerobic exercise) most days of the week. This may include walking, swimming, or biking. ?Get at least 30 minutes of exercise that strengthens your muscles (resistance exercise) at least 3 days a week. This may include lifting weights or doing Pilates. ?Do not smoke or use any products that contain nicotine or tobacco. If you need help quitting, ask your doctor. ?Check your blood pressure at home as told by your doctor. ?Keep all follow-up visits. ?Medicines ?Take over-the-counter and prescription medicines   only as told by your doctor. Follow directions carefully. ?Do not skip doses of blood pressure medicine. The medicine does not work as well if you skip doses. Skipping doses also puts you at risk for problems. ?Ask your doctor about side effects or reactions to medicines that you should watch  for. ?Contact a doctor if: ?You think you are having a reaction to the medicine you are taking. ?You have headaches that keep coming back. ?You feel dizzy. ?You have swelling in your ankles. ?You have trouble with your vision. ?Get help right away if: ?You get a very bad headache. ?You start to feel mixed up (confused). ?You feel weak or numb. ?You feel faint. ?You have very bad pain in your: ?Chest. ?Belly (abdomen). ?You vomit more than once. ?You have trouble breathing. ?These symptoms may be an emergency. Get help right away. Call 911. ?Do not wait to see if the symptoms will go away. ?Do not drive yourself to the hospital. ?Summary ?Hypertension is another name for high blood pressure. ?High blood pressure forces your heart to work harder to pump blood. ?For most people, a normal blood pressure is less than 120/80. ?Making healthy choices can help lower blood pressure. If your blood pressure does not get lower with healthy choices, you may need to take medicine. ?This information is not intended to replace advice given to you by your health care provider. Make sure you discuss any questions you have with your health care provider. ?Document Revised: 02/13/2021 Document Reviewed: 02/13/2021 ?Elsevier Patient Education ? 2023 [employer] Inc[employer] ? ?

## 2022-09-22 ENCOUNTER — Encounter: Payer: Self-pay | Admitting: Internal Medicine

## 2022-09-22 ENCOUNTER — Telehealth: Payer: Self-pay | Admitting: Plastic Surgery

## 2022-09-22 NOTE — Telephone Encounter (Signed)
Patient called in and wanted to know if we had heard anything back from the pathology report from growth on eye lid. Callback is [phone]

## 2022-09-22 NOTE — Telephone Encounter (Signed)
LVM that patient appt has been changed to 10:45 am on 09/23/22.

## 2022-09-23 ENCOUNTER — Ambulatory Visit: Payer: PPO | Admitting: Plastic Surgery

## 2022-09-23 ENCOUNTER — Ambulatory Visit (INDEPENDENT_AMBULATORY_CARE_PROVIDER_SITE_OTHER): Payer: PPO | Admitting: Surgical

## 2022-09-23 DIAGNOSIS — L989 Disorder of the skin and subcutaneous tissue, unspecified: Secondary | ICD-10-CM

## 2022-09-23 DIAGNOSIS — L820 Inflamed seborrheic keratosis: Secondary | ICD-10-CM

## 2022-09-23 NOTE — Telephone Encounter (Signed)
Results have not been uploaded into the chart yet. Pt was seen in the office by [facility] today concerning results.

## 2022-09-23 NOTE — Progress Notes (Signed)
Patient is a 72 year old female with history of a skin lesion to her right upper eyelid.  She underwent excision of the skin lesion with Dr. Ladona Ridgel on 09/17/2022.  During the procedure, the lesion was excised and the wound was closed with 5-0 Prolene sutures.  Specimen was sent to pathology.  The pathology has not yet resulted.  Patient presents to the clinic today for postprocedural follow-up.  Today, she reports overall she is doing well.  She has noticed some itching in the area, but has no other concerns at this time.  She is curious about what the pathology is, but is aware that the pathology has not yet resulted.  On exam right eyebrow incision is overall healing well, she does have some mild separation along the medial aspect, however this appears to be scabbed and doing well.  There is no surrounding erythema.  There is no active drainage.  There is no significant swelling or ecchymosis noted.  A/P:  Prolene sutures were removed, patient tolerated this well.  There is no signs of infection or concern on exam at this time. I discussed recommendations for applying thin amount of Vaseline to the area 1-2 times per day to help with the small incisional separation to heal.  We discussed sunscreen to prevent discoloration.  We discussed that once the pathology has resulted, if there is any abnormalities we would certainly call her to notify her and discussed neck steps.  I also discussed with her that she is welcome to call our office if she has any questions or concerns after reviewing the pathology results herself.

## 2022-09-24 ENCOUNTER — Encounter: Payer: Self-pay | Admitting: Internal Medicine

## 2022-09-24 ENCOUNTER — Ambulatory Visit (AMBULATORY_SURGERY_CENTER): Payer: PPO | Admitting: Internal Medicine

## 2022-09-24 VITALS — BP 157/73 | HR 51 | Temp 97.5°F | Resp 12 | Ht 68.0 in | Wt 165.0 lb

## 2022-09-24 DIAGNOSIS — F419 Anxiety disorder, unspecified: Secondary | ICD-10-CM | POA: Diagnosis not present

## 2022-09-24 DIAGNOSIS — Z1211 Encounter for screening for malignant neoplasm of colon: Secondary | ICD-10-CM | POA: Diagnosis not present

## 2022-09-24 DIAGNOSIS — K317 Polyp of stomach and duodenum: Secondary | ICD-10-CM

## 2022-09-24 DIAGNOSIS — Z1509 Genetic susceptibility to other malignant neoplasm: Secondary | ICD-10-CM | POA: Diagnosis not present

## 2022-09-24 DIAGNOSIS — F32A Depression, unspecified: Secondary | ICD-10-CM | POA: Diagnosis not present

## 2022-09-24 DIAGNOSIS — D125 Benign neoplasm of sigmoid colon: Secondary | ICD-10-CM

## 2022-09-24 DIAGNOSIS — K219 Gastro-esophageal reflux disease without esophagitis: Secondary | ICD-10-CM

## 2022-09-24 DIAGNOSIS — K2970 Gastritis, unspecified, without bleeding: Secondary | ICD-10-CM

## 2022-09-24 DIAGNOSIS — D122 Benign neoplasm of ascending colon: Secondary | ICD-10-CM | POA: Diagnosis not present

## 2022-09-24 DIAGNOSIS — K635 Polyp of colon: Secondary | ICD-10-CM | POA: Diagnosis not present

## 2022-09-24 DIAGNOSIS — D259 Leiomyoma of uterus, unspecified: Secondary | ICD-10-CM | POA: Diagnosis not present

## 2022-09-24 DIAGNOSIS — K259 Gastric ulcer, unspecified as acute or chronic, without hemorrhage or perforation: Secondary | ICD-10-CM

## 2022-09-24 DIAGNOSIS — D132 Benign neoplasm of duodenum: Secondary | ICD-10-CM | POA: Diagnosis not present

## 2022-09-24 DIAGNOSIS — I10 Essential (primary) hypertension: Secondary | ICD-10-CM | POA: Diagnosis not present

## 2022-09-24 MED ORDER — SODIUM CHLORIDE 0.9 % IV SOLN
500.0000 mL | Freq: Once | INTRAVENOUS | Status: DC
Start: 2022-09-24 — End: 2022-09-24

## 2022-09-24 NOTE — Op Note (Signed)
[facility] Patient Name: Shelby Randolph Procedure Date: 09/24/2022 8:35 AM MRN: [id number] Endoscopist: Iva Boop , MD, [id number] Age: 72 Referring MD:  Date of Birth: 16-Nov-1950 Gender: Female Account #: [id number] Procedure:                Upper GI endoscopy Indications:              Hereditary nonpolyposis colorectal cancer (Lynch                            Syndrome) Medicines:                Monitored Anesthesia Care Procedure:                Pre-Anesthesia Assessment:                           - Prior to the procedure, a History and Physical                            was performed, and patient medications and                            allergies were reviewed. The patient's tolerance of                            previous anesthesia was also reviewed. The risks                            and benefits of the procedure and the sedation                            options and risks were discussed with the patient.                            All questions were answered, and informed consent                            was obtained. Prior Anticoagulants: The patient has                            taken no anticoagulant or antiplatelet agents. ASA                            Grade Assessment: II - A patient with mild systemic                            disease. After reviewing the risks and benefits,                            the patient was deemed in satisfactory condition to                            undergo the procedure.  After obtaining informed consent, the endoscope was                            passed under direct vision. Throughout the                            procedure, the patient's blood pressure, pulse, and                            oxygen saturations were monitored continuously. The                            GIF [id number] #[id number] was introduced through the                            mouth, and advanced to the second part of  duodenum.                            The upper GI endoscopy was accomplished without                            difficulty. The patient tolerated the procedure                            well. Scope In: Scope Out: Findings:                 A single 7 mm sessile polyp was found in the second                            portion of the duodenum. The polyp was removed with                            a cold snare. Resection and retrieval were                            complete. Verification of patient identification                            for the specimen was done. Estimated blood loss was                            minimal.                           Scattered moderate inflammation characterized by                            erosions and erythema was found in the gastric                            antrum. Biopsies were taken with a cold forceps for  histology. Verification of patient identification                            for the specimen was done. Estimated blood loss was                            minimal.                           The exam was otherwise without abnormality.                           The cardia and gastric fundus were normal on                            retroflexion. Complications:            No immediate complications. Estimated Blood Loss:     Estimated blood loss was minimal. Impression:               - A single duodenal polyp. Resected and retrieved.                           - Gastritis. Biopsied.                           - The examination was otherwise normal. Recommendation:           - Patient has a contact number available for                            emergencies. The signs and symptoms of potential                            delayed complications were discussed with the                            patient. Return to normal activities tomorrow.                            Written discharge instructions were provided to the                             patient.                           - Resume previous diet.                           - Continue present medications.                           - Await pathology results.                           - follow-up exam pending path review Iva Boop, MD 09/24/2022 9:22:01 AM This report has been signed electronically.

## 2022-09-24 NOTE — Progress Notes (Signed)
Called to room to assist during endoscopic procedure.  Patient ID and intended procedure confirmed with present staff. Received instructions for my participation in the procedure from the performing physician.  

## 2022-09-24 NOTE — Op Note (Signed)
[facility] Patient Name: Shelby Randolph Procedure Date: 09/24/2022 8:26 AM MRN: [id number] Endoscopist: Iva Boop , MD, [id number] Age: 72 Referring MD:  Date of Birth: [birth year] Gender: Female Account #: [id number] Procedure:                Colonoscopy Indications:              Lynch Syndrome Medicines:                Monitored Anesthesia Care Procedure:                Pre-Anesthesia Assessment:                           - Prior to the procedure, a History and Physical                            was performed, and patient medications and                            allergies were reviewed. The patient's tolerance of                            previous anesthesia was also reviewed. The risks                            and benefits of the procedure and the sedation                            options and risks were discussed with the patient.                            All questions were answered, and informed consent                            was obtained. Prior Anticoagulants: The patient has                            taken no anticoagulant or antiplatelet agents. ASA                            Grade Assessment: II - A patient with mild systemic                            disease. After reviewing the risks and benefits,                            the patient was deemed in satisfactory condition to                            undergo the procedure.                           After obtaining informed consent, the colonoscope  was passed under direct vision. Throughout the                            procedure, the patient's blood pressure, pulse, and                            oxygen saturations were monitored continuously. The                            CF [id number] #[id number] was introduced through the anus                            and advanced to the the cecum, identified by                            appendiceal orifice and ileocecal valve. The                             colonoscopy was performed without difficulty. The                            patient tolerated the procedure well. The quality                            of the bowel preparation was adequate. The bowel                            preparation used was Miralax via split dose                            instruction. The ileocecal valve, appendiceal                            orifice, and rectum were photographed. Scope In: 8:56:35 AM Scope Out: 9:11:52 AM Scope Withdrawal Time: 0 hours 11 minutes 58 seconds  Total Procedure Duration: 0 hours 15 minutes 17 seconds  Findings:                 The perianal and digital rectal examinations were                            normal.                           Two sessile polyps were found in the sigmoid colon                            and ascending colon. The polyps were 3 to 8 mm in                            size. These polyps were removed with a cold snare.                            Resection and retrieval were complete. Verification  of patient identification for the specimen was                            done. Estimated blood loss was minimal.                           Many small and large-opening diverticula were found                            in the entire colon.                           The exam was otherwise without abnormality on                            direct and retroflexion views. Complications:            No immediate complications. Estimated Blood Loss:     Estimated blood loss was minimal. Impression:               - Two 3 to 8 mm polyps in the sigmoid colon and in                            the ascending colon, removed with a cold snare.                            Resected and retrieved.                           - Severe diverticulosis in the entire examined                            colon.                           - The examination was otherwise normal on direct                             and retroflexion views.                           - Personal history of colonic polyps - 10/2017 3                            adenomas                           02/2019 diminutive ssp.                           - Lynch syndrome Recommendation:           - Patient has a contact number available for                            emergencies. The signs and symptoms of potential  delayed complications were discussed with the                            patient. Return to normal activities tomorrow.                            Written discharge instructions were provided to the                            patient.                           - Resume previous diet.                           - Continue present medications.                           - Await pathology results.                           - Repeat colonoscopy in 1 year. Iva Boop, MD 09/24/2022 9:27:36 AM This report has been signed electronically.

## 2022-09-24 NOTE — Progress Notes (Signed)
Vitals-Eric  Pt's states no medical or surgical changes since previsit or office visit. 

## 2022-09-24 NOTE — Progress Notes (Signed)
Pt resting comfortably. VSS. Airway intact. SBAR complete to RN. All questions answered.   

## 2022-09-24 NOTE — Patient Instructions (Addendum)
Some abnormalities today: Duodenal polyp - removed Gastritis - biopsied Two colon polyps - removed.  Everything looks benign.  I will let you know pathology results and when to have follow-up exams by mail and/or My Chart.  I appreciate the opportunity to care for you. Iva Boop, MD, [facility]    Await pathology report. Handout on Gastritis, polyps, and diverticulosis provided.  YOU HAD AN ENDOSCOPIC PROCEDURE TODAY AT THE [facility]:   Refer to the procedure report that was given to you for any specific questions about what was found during the examination.  If the procedure report does not answer your questions, please call your gastroenterologist to clarify.  If you requested that your care partner not be given the details of your procedure findings, then the procedure report has been included in a sealed envelope for you to review at your convenience later.  YOU SHOULD EXPECT: Some feelings of bloating in the abdomen. Passage of more gas than usual.  Walking can help get rid of the air that was put into your GI tract during the procedure and reduce the bloating. If you had a lower endoscopy (such as a colonoscopy or flexible sigmoidoscopy) you may notice spotting of blood in your stool or on the toilet paper. If you underwent a bowel prep for your procedure, you may not have a normal bowel movement for a few days.  Please Note:  You might notice some irritation and congestion in your nose or some drainage.  This is from the oxygen used during your procedure.  There is no need for concern and it should clear up in a day or so.  SYMPTOMS TO REPORT IMMEDIATELY:  Following lower endoscopy (colonoscopy or flexible sigmoidoscopy):  Excessive amounts of blood in the stool  Significant tenderness or worsening of abdominal pains  Swelling of the abdomen that is new, acute  Fever of 100F or higher  Following upper endoscopy (EGD)  Vomiting of blood or coffee ground  material  New chest pain or pain under the shoulder blades  Painful or persistently difficult swallowing  New shortness of breath  Fever of 100F or higher  Black, tarry-looking stools  For urgent or emergent issues, a gastroenterologist can be reached at any hour by calling [phone]. Do not use MyChart messaging for urgent concerns.    DIET:  We do recommend a small meal at first, but then you may proceed to your regular diet.  Drink plenty of fluids but you should avoid alcoholic beverages for 24 hours.  ACTIVITY:  You should plan to take it easy for the rest of today and you should NOT DRIVE or use heavy machinery until tomorrow (because of the sedation medicines used during the test).    FOLLOW UP: Our staff will call the number listed on your records the next business day following your procedure.  We will call around 7:15- 8:00 am to check on you and address any questions or concerns that you may have regarding the information given to you following your procedure. If we do not reach you, we will leave a message.     If any biopsies were taken you will be contacted by phone or by letter within the next 1-3 weeks.  Please call us at [phone] if you have not heard about the biopsies in 3 weeks.    SIGNATURES/CONFIDENTIALITY: You and/or your care partner have signed paperwork which will be entered into your electronic medical record.  These signatures attest  to the fact that that the information above on your After Visit Summary has been reviewed and is understood.  Full responsibility of the confidentiality of this discharge information lies with you and/or your care-partner.

## 2022-09-24 NOTE — Progress Notes (Signed)
[facility] History and Physical   Primary Care Physician:  Jac Canavan, PA-C   Reason for Procedure:   Lynch syndrome,  Plan:    EGD and colonoscopy     HPI: Shelby Randolph is a 72 y.o. female here for [facility] screening/surveillance and has had some nausea, vomiting and diarrhea episodes in recent past - self-limited   Past Medical History:  Diagnosis Date   Abnormal screening cardiac CT 11/2011   coronary calcium score 13, 24% percentile, mild plaque in prox LAD and ostial RCA   Adenomatous colon polyp    Dr. Stan Head   Allergy    Anemia    Anxiety    Arthritis    Asthma    Atherosclerosis of aorta (HCC) 03/2014   abnormal on ultrasound, repeat US within 5 years   Cancer [facility])    Skin cancer squamous   Depression    Diverticulosis    Eye injury    in college, some pertinent loss of vision in left eye   Genital herpes    GERD (gastroesophageal reflux disease)    H/O echocardiogram 10/28/2018   LVEF 45-50%, impaired diastolic dysfunction, mild mitral and tricuspid regurgitation; Dr. Rosemary Holms   History of bone density study 09/2011   normal   History of mammogram    Dr. Arelia Sneddon   History of melanoma    sees Carollee Herter, dermatology   History of PFTs 01/20/2011   moderate airflow limitation, no significant response to bronchodilator   Hyperlipidemia    Hypertension 12/2011   Insomnia    Migraines    Dr. Vela Prose, migraines- PAST HX   MSH6-related Lynch syndrome (HNPCC5) 08/20/2017   Nearsightedness    wears glasses   Osteopenia 2020   Post-operative nausea and vomiting    Routine gynecological examination    Dr. Mariea Stable virus disease 06/2014    Past Surgical History:  Procedure Laterality Date   COLONOSCOPY  2016   Dr. Stan Head, 2014 Dr. Kinnie Scales   COLONOSCOPY  01/2019   weak anal sphincter tone, moderate rectocele, polyp, scattered diverticula; Dr. Stan Head   COLONOSCOPY  2023   CG-miralax(prep adeq)   ELBOW SURGERY  Left 03/2022   ESOPHAGOGASTRODUODENOSCOPY  11/2017   normal, Dr. Stan Head   POLYPECTOMY     ROTATOR CUFF REPAIR Right 05/2010   TONSILLECTOMY AND ADENOIDECTOMY     as a kid   TUBAL LIGATION  1985   VAGINAL HYSTERECTOMY  1998   total; abnormal peroids   VARICOSE VEIN SURGERY     bilat    Prior to Admission medications   Medication Sig Start Date End Date Taking? Authorizing Provider  alosetron (LOTRONEX) 0.5 MG tablet Take 1 tablet (0.5 mg total) by mouth 2 (two) times daily. 03/17/22  Yes Iva Boop, MD  amoxicillin (AMOXIL) 500 MG capsule Take 500 mg by mouth 3 (three) times daily. 09/22/22  Yes [provider]  atorvastatin (LIPITOR) 40 MG tablet Take 1 tablet (40 mg total) by mouth daily. 07/14/22 07/14/23 Yes Tysinger, Kermit Balo, PA-C  cetirizine (ZYRTEC) 10 MG tablet Take 10 mg by mouth daily.   Yes [provider]  cholecalciferol (VITAMIN D3) 25 MCG (1000 UNIT) tablet Take 1 tablet (1,000 Units total) by mouth daily. 07/11/20  Yes Tysinger, Kermit Balo, PA-C  Multiple Vitamin (MULTIVITAMIN ADULT PO) Take 1 tablet by mouth daily.   Yes [provider]  olmesartan (BENICAR) 40 MG tablet Take 1 tablet (40 mg  total) by mouth daily. 09/18/22  Yes Tysinger, Kermit Balo, PA-C  ondansetron (ZOFRAN) 4 MG tablet Take 4 mg by mouth every 8 (eight) hours as needed for nausea, vomiting or refractory nausea / vomiting. 09/02/22  Yes [provider]  Probiotic Product (PRO-BIOTIC BLEND PO) Take 1 tablet by mouth daily.   Yes [provider]  Sunscreens (SUNBLOCK SPF 30 EX) Apply 1 Application topically daily.   Yes [provider]  zolpidem (AMBIEN) 10 MG tablet TAKE 1/2 TO 1 TABLET AT BEDTIME 09/11/22  Yes Tysinger, Kermit Balo, PA-C  acetaminophen (TYLENOL) 500 MG tablet 1-2 tablets twice to three times daily as needed. 02/03/12   Tysinger, Kermit Balo, PA-C  acyclovir (ZOVIRAX) 200 MG capsule Take 200 mg by mouth daily as needed.    [provider]   albuterol (VENTOLIN HFA) 108 (90 Base) MCG/ACT inhaler Inhale 2 puffs into the lungs every 6 (six) hours as needed for wheezing or shortness of breath. 12/06/21   [provider]  aspirin EC 81 MG tablet Take 1 tablet (81 mg total) by mouth daily. 07/14/22   Tysinger, Kermit Balo, PA-C  Olopatadine HCl (PATADAY) 0.2 % SOLN Apply 1 drop to eye in the morning and at bedtime. 09/07/22   Tysinger, Kermit Balo, PA-C  triamcinolone cream (KENALOG) 0.1 % Apply 1 Application topically 2 (two) times daily. Patient taking differently: Apply 1 Application topically 2 (two) times daily as needed (rash). 09/07/22   Tysinger, Kermit Balo, PA-C    Current Outpatient Medications  Medication Sig Dispense Refill   alosetron (LOTRONEX) 0.5 MG tablet Take 1 tablet (0.5 mg total) by mouth 2 (two) times daily. 60 tablet 11   amoxicillin (AMOXIL) 500 MG capsule Take 500 mg by mouth 3 (three) times daily.     atorvastatin (LIPITOR) 40 MG tablet Take 1 tablet (40 mg total) by mouth daily. 90 tablet 3   cetirizine (ZYRTEC) 10 MG tablet Take 10 mg by mouth daily.     cholecalciferol (VITAMIN D3) 25 MCG (1000 UNIT) tablet Take 1 tablet (1,000 Units total) by mouth daily. 90 tablet 3   Multiple Vitamin (MULTIVITAMIN ADULT PO) Take 1 tablet by mouth daily.     olmesartan (BENICAR) 40 MG tablet Take 1 tablet (40 mg total) by mouth daily. 90 tablet 3   ondansetron (ZOFRAN) 4 MG tablet Take 4 mg by mouth every 8 (eight) hours as needed for nausea, vomiting or refractory nausea / vomiting.     Probiotic Product (PRO-BIOTIC BLEND PO) Take 1 tablet by mouth daily.     Sunscreens (SUNBLOCK SPF 30 EX) Apply 1 Application topically daily.     zolpidem (AMBIEN) 10 MG tablet TAKE 1/2 TO 1 TABLET AT BEDTIME 90 tablet 0   acetaminophen (TYLENOL) 500 MG tablet 1-2 tablets twice to three times daily as needed. 100 tablet 5   acyclovir (ZOVIRAX) 200 MG capsule Take 200 mg by mouth daily as needed.     albuterol (VENTOLIN HFA) 108 (90 Base)  MCG/ACT inhaler Inhale 2 puffs into the lungs every 6 (six) hours as needed for wheezing or shortness of breath.     aspirin EC 81 MG tablet Take 1 tablet (81 mg total) by mouth daily. 90 tablet 3   Olopatadine HCl (PATADAY) 0.2 % SOLN Apply 1 drop to eye in the morning and at bedtime. 2.5 mL 0   triamcinolone cream (KENALOG) 0.1 % Apply 1 Application topically 2 (two) times daily. (Patient taking differently: Apply 1 Application topically  2 (two) times daily as needed (rash).) 30 g 0   Current Facility-Administered Medications  Medication Dose Route Frequency Provider Last Rate Last Admin   0.9 %  sodium chloride infusion  500 mL Intravenous Once Iva Boop, MD        Allergies as of 09/24/2022 - Review Complete 09/24/2022  Allergen Reaction Noted   Dust mite extract Cough and Other (See Comments) 05/11/1954   Misc. sulfonamide containing compounds Anaphylaxis 09/09/2022   Sulfonamide derivatives Anaphylaxis 11/28/2014   Guaifenesin Other (See Comments) 05/09/2012   Mucinex [guaifenesin er] Other (See Comments) 05/09/2012    Family History  Problem Relation Age of Onset   COPD Mother        died age 65yo.  emphysema   Colon polyps Father    Colon cancer Father 51        died age 16yo   Colon polyps Brother    Colon cancer Brother 30   Breast cancer Maternal Aunt    Colon polyps Paternal Uncle    Colon cancer Paternal Uncle        2   Colon polyps Paternal Grandmother    Colon cancer Paternal Grandmother 81       colon cancer    Breast cancer Daughter    Diabetes Neg Hx    Heart disease Neg Hx    Hypertension Neg Hx    Stroke Neg Hx    Rectal cancer Neg Hx    Stomach cancer Neg Hx    Esophageal cancer Neg Hx     Social History   Socioeconomic History   Marital status: Widowed    Spouse name: Not on file   Number of children: 5   Years of education: Not on file   Highest education level: Not on file  Occupational History   Occupation: compliance agent (RN)     Employer: [employer]    Comment: Nurse Practitioner  Tobacco Use   Smoking status: Never   Smokeless tobacco: Never  Vaping Use   Vaping Use: Never used  Substance and Sexual Activity   Alcohol use: Not Currently    Comment: occasional   Drug use: No   Sexual activity: Not on file    Comment: exercise - walks[place], cardio at gym 3 x/wk, yoga, widowed   Other Topics Concern   Not on file  Social History Narrative   Lives alone, has 5 children ([place], [place], [place], and [place]), has several grandchildren, daughter Denny Peon in [place] in the [employer], exercises 5 days per week - walking, kickboxing, yoga; [employer];  Works for [employer] in [employer]   Social Determinants of Health   Financial Resource Strain: Not on file  Food Insecurity: Not on file  Transportation Needs: Not on file  Physical Activity: Not on file  Stress: Not on file  Social Connections: Not on file  Intimate Partner Violence: Not on file    Review of Systems:  All other review of systems negative except as mentioned in the HPI.  Physical Exam: Vital signs BP (!) 154/88 (BP Location: Right Arm, Patient Position: Sitting, Cuff Size: Normal)   Pulse 70   Temp (!) 97.5 F (36.4 C) (Temporal)   Ht 5\' 8"  (1.727 m)   Wt 165 lb (74.8 kg)   SpO2 98%   BMI 25.09 kg/m   General:   Alert,  Well-developed, well-nourished, pleasant and cooperative in NAD Lungs:  Clear throughout to auscultation.  Heart:  Regular rate and rhythm; no murmurs, clicks, rubs,  or gallops. Abdomen:  Soft, nontender and nondistended. Normal bowel sounds.   Neuro/Psych:  Alert and cooperative. Normal mood and affect. A and O x 3   @Michole Lecuyer  Sena Slate, MD, [facility] [phone] (pager) 09/24/2022 8:30 AM@

## 2022-09-25 ENCOUNTER — Telehealth: Payer: Self-pay

## 2022-09-25 NOTE — Telephone Encounter (Signed)
Follow up call placed, VM obtained and message left. 

## 2022-09-28 ENCOUNTER — Encounter: Payer: Self-pay | Admitting: Internal Medicine

## 2022-09-28 ENCOUNTER — Other Ambulatory Visit: Payer: Self-pay

## 2022-10-01 ENCOUNTER — Encounter: Payer: Self-pay | Admitting: Internal Medicine

## 2022-10-09 ENCOUNTER — Encounter: Payer: Self-pay | Admitting: Internal Medicine

## 2022-10-14 DIAGNOSIS — S52022D Displaced fracture of olecranon process without intraarticular extension of left ulna, subsequent encounter for closed fracture with routine healing: Secondary | ICD-10-CM | POA: Diagnosis not present

## 2022-11-14 ENCOUNTER — Other Ambulatory Visit: Payer: Self-pay | Admitting: Internal Medicine

## 2022-11-16 NOTE — Telephone Encounter (Signed)
Please refill if approved [name], thank you. 

## 2022-12-07 ENCOUNTER — Other Ambulatory Visit: Payer: Self-pay | Admitting: Medical

## 2023-01-27 ENCOUNTER — Ambulatory Visit (INDEPENDENT_AMBULATORY_CARE_PROVIDER_SITE_OTHER): Payer: PPO | Admitting: Medical

## 2023-01-27 ENCOUNTER — Encounter: Payer: Self-pay | Admitting: Medical

## 2023-01-27 VITALS — BP 140/96 | HR 90 | Wt 171.6 lb

## 2023-01-27 DIAGNOSIS — G4700 Insomnia, unspecified: Secondary | ICD-10-CM | POA: Diagnosis not present

## 2023-01-27 DIAGNOSIS — R519 Headache, unspecified: Secondary | ICD-10-CM

## 2023-01-27 DIAGNOSIS — T881XXA Other complications following immunization, not elsewhere classified, initial encounter: Secondary | ICD-10-CM

## 2023-01-27 DIAGNOSIS — I10 Essential (primary) hypertension: Secondary | ICD-10-CM | POA: Diagnosis not present

## 2023-01-27 DIAGNOSIS — F411 Generalized anxiety disorder: Secondary | ICD-10-CM | POA: Diagnosis not present

## 2023-01-27 MED ORDER — ONDANSETRON HCL 4 MG PO TABS: 4.0000 mg | ORAL_TABLET | Freq: Three times a day (TID) | ORAL | 1 refills | Status: DC | PRN

## 2023-01-27 MED ORDER — AMLODIPINE BESYLATE 5 MG PO TABS: 5.0000 mg | ORAL_TABLET | Freq: Every day | ORAL | 1 refills | Status: DC

## 2023-01-27 MED ORDER — TRAMADOL HCL 50 MG PO TABS: 50.0000 mg | ORAL_TABLET | Freq: Two times a day (BID) | ORAL | 0 refills | Status: AC | PRN

## 2023-01-27 NOTE — Patient Instructions (Signed)
Recommendations: Continue Olmesartan 40mg  daily for blood pressure BEGIN amlodipine 5mg  once daily blood pressure pill Monitor blood pressure every couple days for the next few weeks, write your numbers down, and lets aim for 120/70 We will need to recheck in a month on BP Begin Ultram/tramadol pain medicaiton today, and over the next few days 1-2 times daily if needed to break the current headache.   If not seeing improvement in headache over the next few days, let me know

## 2023-01-27 NOTE — Progress Notes (Signed)
Subjective:  Shelby Randolph is a 72 y.o. female who presents for Chief Complaint  Patient presents with   Hypertension    BP high bottom number is over 100 and waking up with headaches. And having not sleeping a lot and will be up and down all night      Here for complaint of elevated blood pressure, headache, sleep issues.  She is compliant with olmesartan 40 mg daily but in recent weeks her blood pressures are all running high including diastolic over 100.  She has been having headaches for the past week not sleeping well lately despite taking Ambien chronically.  She has been under some stress lately.  Her [place]  She is also working more hours of late.  She denies chest pain, palpitations, numbness or tingling or weakness.  No slurred speech.  No confusion  Within the past week she went to the pharmacy and got 3 vaccines all at once including COVID flu and RSV.  The headache has been daily for the last week, throbbing, headache all over.  Not sinus related.  No allergy symptoms.  She takes her Zyrtec every day.  She has had some nausea with a headache.  Using Tylenol for the headache.    No other aggravating or relieving factors.    No other c/o.  Past Medical History:  Diagnosis Date   Abnormal screening cardiac CT 11/2011   coronary calcium score 13, 24% percentile, mild plaque in prox LAD and ostial RCA   Adenomatous colon polyp    Dr. Stan Head   Allergy    Anemia    Anxiety    Arthritis    Asthma    Atherosclerosis of aorta (HCC) 03/2014   abnormal on ultrasound, repeat US within 5 years   Cancer [facility])    Skin cancer squamous   Depression    Diverticulosis    Eye injury    in college, some pertinent loss of vision in left eye   Genital herpes    GERD (gastroesophageal reflux disease)    H/O echocardiogram 10/28/2018   LVEF 45-50%, impaired diastolic dysfunction, mild mitral and tricuspid regurgitation; Dr. Rosemary Holms    History of bone density study 09/2011   normal   History of mammogram    Dr. Arelia Sneddon   History of melanoma    sees Carollee Herter, dermatology   History of PFTs 01/20/2011   moderate airflow limitation, no significant response to bronchodilator   Hyperlipidemia    Hypertension 12/2011   Insomnia    Migraines    Dr. Vela Prose, migraines- PAST HX   MSH6-related Lynch syndrome (HNPCC5) 08/20/2017   Nearsightedness    wears glasses   Osteopenia 2020   Post-operative nausea and vomiting    Routine gynecological examination    Dr. Mariea Stable virus disease 06/2014   Current Outpatient Medications on File Prior to Visit  Medication Sig Dispense Refill   acetaminophen (TYLENOL) 500 MG tablet 1-2 tablets twice to three times daily as needed. 100 tablet 5   acyclovir (ZOVIRAX) 200 MG capsule Take 200 mg by mouth daily as needed.     albuterol (VENTOLIN HFA) 108 (90 Base) MCG/ACT inhaler Inhale 2 puffs into the lungs every 6 (six) hours as needed for wheezing or shortness of breath.     aspirin EC 81 MG tablet Take 1 tablet (81 mg total) by mouth daily. 90 tablet 3   atorvastatin (LIPITOR) 40 MG tablet  Take 1 tablet (40 mg total) by mouth daily. 90 tablet 3   cetirizine (ZYRTEC) 10 MG tablet Take 10 mg by mouth daily.     cholecalciferol (VITAMIN D3) 25 MCG (1000 UNIT) tablet Take 1 tablet (1,000 Units total) by mouth daily. 90 tablet 3   Multiple Vitamin (MULTIVITAMIN ADULT PO) Take 1 tablet by mouth daily.     olmesartan (BENICAR) 40 MG tablet Take 1 tablet (40 mg total) by mouth daily. 90 tablet 3   Olopatadine HCl (PATADAY) 0.2 % SOLN Apply 1 drop to eye in the morning and at bedtime. 2.5 mL 0   Probiotic Product (PRO-BIOTIC BLEND PO) Take 1 tablet by mouth daily.     Sunscreens (SUNBLOCK SPF 30 EX) Apply 1 Application topically daily.     triamcinolone cream (KENALOG) 0.1 % Apply 1 Application topically 2 (two) times daily. (Patient taking differently: Apply 1 Application topically 2  (two) times daily as needed (rash).) 30 g 0   zolpidem (AMBIEN) 10 MG tablet TAKE 1/2 TO 1 TABLET AT BEDTIME 90 tablet 0   alosetron (LOTRONEX) 0.5 MG tablet TAKE ONE TABLET BY MOUTH TWICE DAILY 60 tablet 11   No current facility-administered medications on file prior to visit.    The following portions of the patient's history were reviewed and updated as appropriate: allergies, current medications, past family history, past medical history, past social history, past surgical history and problem list.  ROS Otherwise as in subjective above    Objective: BP (!) 140/96   Pulse 90   Wt 171 lb 9.6 oz (77.8 kg)   BMI 26.09 kg/m   General appearance: alert, no distress, well developed, well nourished HEENT: normocephalic, sclerae anicteric, conjunctiva pink and moist, TMs pearly, nares patent, no discharge or erythema, pharynx normal Neck: supple, no lymphadenopathy, no thyromegaly, no masses Heart: RRR, normal S1, S2, no murmurs Lungs: CTA bilaterally, no wheezes, rhonchi, or rales Pulses: 2+ radial pulses, 2+ pedal pulses, normal cap refill Ext: no edema Neuro: CN II through XII intact, nonfocal exam Psych: Pleasant, answers question appropriately    Assessment: Encounter Diagnoses  Name Primary?   Generalized anxiety disorder Yes   Essential hypertension    Insomnia, unspecified type    Intractable headache, unspecified chronicity pattern, unspecified headache type    Complication of immunization, initial encounter      Plan: We discussed potential problems of her headache.  She had 3 different vaccines within the past week.  She has had a little more stress of late.  We discussed other potential factors.  Begin tramadol today in the next few days as needed to help break the headache cycle.  Zofran as needed.  Continue Ambien as usual  Continue olmesartan 40 mg daily, but add amlodipine 5 mg daily for uncontrolled blood pressure.  Hydrate well, rest next few days.   Work on good sleep hygiene and reducing stress where possible.  If not seeing improvements with the next 72 hours let me know   Alleyah was seen today for hypertension.  Diagnoses and all orders for this visit:  Generalized anxiety disorder  Essential hypertension  Insomnia, unspecified type  Intractable headache, unspecified chronicity pattern, unspecified headache type  Complication of immunization, initial encounter  Other orders -     amLODipine (NORVASC) 5 MG tablet; Take 1 tablet (5 mg total) by mouth daily. -     traMADol (ULTRAM) 50 MG tablet; Take 1 tablet (50 mg total) by mouth 2 (two) times daily as  needed for up to 5 days. -     ondansetron (ZOFRAN) 4 MG tablet; Take 1 tablet (4 mg total) by mouth every 8 (eight) hours as needed for nausea, vomiting or refractory nausea / vomiting.    Follow up: 1 month

## 2023-01-31 ENCOUNTER — Telehealth: Payer: Self-pay | Admitting: Medical

## 2023-01-31 NOTE — Telephone Encounter (Signed)
P.A. ONDANSETRON

## 2023-02-10 DIAGNOSIS — S52022D Displaced fracture of olecranon process without intraarticular extension of left ulna, subsequent encounter for closed fracture with routine healing: Secondary | ICD-10-CM | POA: Diagnosis not present

## 2023-02-23 DIAGNOSIS — D225 Melanocytic nevi of trunk: Secondary | ICD-10-CM | POA: Diagnosis not present

## 2023-02-23 DIAGNOSIS — L570 Actinic keratosis: Secondary | ICD-10-CM | POA: Diagnosis not present

## 2023-02-23 DIAGNOSIS — L578 Other skin changes due to chronic exposure to nonionizing radiation: Secondary | ICD-10-CM | POA: Diagnosis not present

## 2023-02-23 DIAGNOSIS — Z85828 Personal history of other malignant neoplasm of skin: Secondary | ICD-10-CM | POA: Diagnosis not present

## 2023-02-23 DIAGNOSIS — L814 Other melanin hyperpigmentation: Secondary | ICD-10-CM | POA: Diagnosis not present

## 2023-02-23 DIAGNOSIS — L821 Other seborrheic keratosis: Secondary | ICD-10-CM | POA: Diagnosis not present

## 2023-02-23 DIAGNOSIS — D485 Neoplasm of uncertain behavior of skin: Secondary | ICD-10-CM | POA: Diagnosis not present

## 2023-03-01 NOTE — Telephone Encounter (Signed)
P.A. denied, Medicare only covers this for chemotherapy or radiation inducted nausea/vomiting & post surgical N & V.

## 2023-03-12 ENCOUNTER — Other Ambulatory Visit: Payer: Self-pay | Admitting: Medical

## 2023-03-12 DIAGNOSIS — S52022D Displaced fracture of olecranon process without intraarticular extension of left ulna, subsequent encounter for closed fracture with routine healing: Secondary | ICD-10-CM | POA: Diagnosis not present

## 2023-04-05 DIAGNOSIS — L309 Dermatitis, unspecified: Secondary | ICD-10-CM | POA: Diagnosis not present

## 2023-04-23 DIAGNOSIS — S52124A Nondisplaced fracture of head of right radius, initial encounter for closed fracture: Secondary | ICD-10-CM | POA: Diagnosis not present

## 2023-04-23 DIAGNOSIS — S60222A Contusion of left hand, initial encounter: Secondary | ICD-10-CM | POA: Diagnosis not present

## 2023-04-26 DIAGNOSIS — H43813 Vitreous degeneration, bilateral: Secondary | ICD-10-CM | POA: Diagnosis not present

## 2023-04-26 DIAGNOSIS — H04123 Dry eye syndrome of bilateral lacrimal glands: Secondary | ICD-10-CM | POA: Diagnosis not present

## 2023-04-26 DIAGNOSIS — H2513 Age-related nuclear cataract, bilateral: Secondary | ICD-10-CM | POA: Diagnosis not present

## 2023-04-30 DIAGNOSIS — S52124D Nondisplaced fracture of head of right radius, subsequent encounter for closed fracture with routine healing: Secondary | ICD-10-CM | POA: Diagnosis not present

## 2023-05-31 DIAGNOSIS — S52124D Nondisplaced fracture of head of right radius, subsequent encounter for closed fracture with routine healing: Secondary | ICD-10-CM | POA: Diagnosis not present

## 2023-06-08 ENCOUNTER — Other Ambulatory Visit: Payer: Self-pay | Admitting: Medical

## 2023-06-16 ENCOUNTER — Telehealth: Payer: PPO | Admitting: Medical

## 2023-06-16 ENCOUNTER — Other Ambulatory Visit: Payer: PPO

## 2023-06-16 ENCOUNTER — Other Ambulatory Visit (INDEPENDENT_AMBULATORY_CARE_PROVIDER_SITE_OTHER): Payer: PPO

## 2023-06-16 VITALS — Temp 99.9°F | Wt 165.0 lb

## 2023-06-16 DIAGNOSIS — R051 Acute cough: Secondary | ICD-10-CM

## 2023-06-16 DIAGNOSIS — J029 Acute pharyngitis, unspecified: Secondary | ICD-10-CM

## 2023-06-16 DIAGNOSIS — U071 COVID-19: Secondary | ICD-10-CM | POA: Diagnosis not present

## 2023-06-16 DIAGNOSIS — R6889 Other general symptoms and signs: Secondary | ICD-10-CM | POA: Diagnosis not present

## 2023-06-16 LAB — POCT INFLUENZA A/B
Influenza A, POC: NEGATIVE
Influenza B, POC: NEGATIVE

## 2023-06-16 LAB — POC COVID19 BINAXNOW: SARS Coronavirus 2 Ag: POSITIVE — AB

## 2023-06-16 LAB — POCT RAPID STREP A (OFFICE): Rapid Strep A Screen: NEGATIVE

## 2023-06-16 MED ORDER — NIRMATRELVIR/RITONAVIR (PAXLOVID)TABLET: 3.0000 | ORAL_TABLET | Freq: Two times a day (BID) | ORAL | 0 refills | Status: AC

## 2023-06-16 MED ORDER — BENZONATATE 200 MG PO CAPS: 200.0000 mg | ORAL_CAPSULE | Freq: Three times a day (TID) | ORAL | 0 refills | Status: AC | PRN

## 2023-06-16 NOTE — Progress Notes (Signed)
 Subjective:     Patient ID: Shelby Randolph, female   DOB: February 27, 1951, 73 y.o.   MRN: [id number]  This visit type was conducted due to national recommendations for restrictions regarding the COVID-19 Pandemic (e.g. social distancing) in an effort to limit this patient's exposure and mitigate transmission in our community.  Due to their co-morbid illnesses, this patient is at least at moderate risk for complications without adequate follow up.  This format is felt to be most appropriate for this patient at this time.    Documentation for virtual audio and video telecommunications through [place] encounter:  The patient was located at home. The provider was located in the office. The patient did consent to this visit and is aware of possible charges through their insurance for this visit.  The other persons participating in this telemedicine service were none. Time spent on call was 20 minutes and in review of previous records 20 minutes total.  This virtual service is not related to other E/M service within previous 7 days.   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  Started feeling bad Saturday, worse Monday and Tuesday.  On day 5 of symptoms currently.  She notes bad sore throat, bad headache, congestion, cough, body aches, chills, voice hoarseness.  Hasn't done flu or covid test.  Feels some SOB.   Using tylenol  and some dayquil.  Water intake is ok.  Has had some diarrhea.  Feels rough  and feels like she is getting worse and not improving  Has been around sick contacts at work, multiple people sick with different elements  No other aggravating or relieving factors. No other complaint.  Past Medical History:  Diagnosis Date   Abnormal screening  cardiac CT 11/2011   coronary calcium  score 13, 24% percentile, mild plaque in prox LAD and ostial RCA   Adenomatous colon polyp    Dr. Lupita Commander   Allergy    Anemia    Anxiety    Arthritis    Asthma    Atherosclerosis of aorta (HCC) 03/2014   abnormal on ultrasound, repeat US  within 5 years   Cancer [facility])    Skin cancer squamous   Depression    Diverticulosis    Eye injury    in college, some pertinent loss of vision in left eye   Genital herpes    GERD (gastroesophageal reflux disease)    H/O echocardiogram 10/28/2018   LVEF 45-50%, impaired diastolic dysfunction, mild mitral and tricuspid regurgitation; Dr. Elmira   History of bone density study 09/2011   normal   History of mammogram    Dr. Leva   History of melanoma    sees Lorrayne Ode, dermatology   History of PFTs 01/20/2011   moderate airflow limitation, no significant response to bronchodilator   Hyperlipidemia    Hypertension 12/2011   Insomnia    Migraines    Dr. Lindy, migraines- PAST HX   MSH6-related Lynch syndrome (HNPCC5) 08/20/2017   Nearsightedness    wears glasses   Osteopenia 2020   Post-operative nausea and vomiting    Routine gynecological examination    Dr. Leva Roberts virus disease 06/2014   Current Outpatient Medications on File Prior to Visit  Medication Sig Dispense Refill  acetaminophen  (TYLENOL ) 500 MG tablet 1-2 tablets twice to three times daily as needed. 100 tablet 5   acyclovir  (ZOVIRAX ) 200 MG capsule Take 200 mg by mouth daily as needed.     albuterol  (VENTOLIN  HFA) 108 (90 Base) MCG/ACT inhaler Inhale 2 puffs into the lungs every 6 (six) hours as needed for wheezing or shortness of breath.     amLODipine  (NORVASC ) 5 MG tablet Take 1 tablet (5 mg total) by mouth daily. 30 tablet 1   aspirin  EC 81 MG tablet Take 1 tablet (81 mg total) by mouth daily. 90 tablet 3   atorvastatin  (LIPITOR) 40 MG tablet Take 1 tablet (40 mg total) by mouth daily. 90 tablet 3   cetirizine   (ZYRTEC ) 10 MG tablet Take 10 mg by mouth daily.     cholecalciferol (VITAMIN D3) 25 MCG (1000 UNIT) tablet Take 1 tablet (1,000 Units total) by mouth daily. 90 tablet 3   Multiple Vitamin (MULTIVITAMIN ADULT PO) Take 1 tablet by mouth daily.     olmesartan  (BENICAR ) 40 MG tablet Take 1 tablet (40 mg total) by mouth daily. 90 tablet 3   Olopatadine  HCl (PATADAY ) 0.2 % SOLN Apply 1 drop to eye in the morning and at bedtime. 2.5 mL 0   ondansetron  (ZOFRAN ) 4 MG tablet Take 1 tablet (4 mg total) by mouth every 8 (eight) hours as needed for nausea, vomiting or refractory nausea / vomiting. 20 tablet 1   Probiotic Product (PRO-BIOTIC BLEND PO) Take 1 tablet by mouth daily.     triamcinolone  cream (KENALOG ) 0.1 % Apply 1 Application topically 2 (two) times daily. (Patient taking differently: Apply 1 Application topically 2 (two) times daily as needed (rash).) 30 g 0   zolpidem  (AMBIEN ) 10 MG tablet TAKE 1/2 TO 1 TABLET AT BEDTIME 90 tablet 0   alosetron  (LOTRONEX ) 0.5 MG tablet TAKE ONE TABLET BY MOUTH TWICE DAILY 60 tablet 11   Sunscreens (SUNBLOCK SPF 30 EX) Apply 1 Application topically daily.     No current facility-administered medications on file prior to visit.     Review of Systems As in subjective    Objective:   Physical Exam Due to coronavirus pandemic stay at home measures, patient visit was virtual and they were not examined in person.   Temp 99.9 F (37.7 C)   Wt 165 lb (74.8 kg)   BMI 25.09 kg/m   General: Hoarse sounding, ill-appearing but answers questions appropriately Coughing      Assessment:     Encounter Diagnoses  Name Primary?   Flu-like symptoms Yes   Sore throat    Acute cough        Plan:     Discussed her symptoms and concerns.  She will come to our office for testing in the back parking lot  Addendum: Pulse oximetry 98%, pulse 86 Positive for COVID, negative for flu and strep  Covid infection, covid illness general recommendations:  I  recommend you rest, hydrate well with water and clear fluids throughout the day.  If you feel dry in the mouth, tongue or feel that you are urinating as much as usual, then increase hydration.  You urine should be like yellow to clear, not dark yellow or darker.     Pain, body aches, or fever: You can use Tylenol  /Acetaminophen  325mg  over the counter for pain or fever, every 4-6 hours  Cough: You can use Tessalon /Benzonatate  prescription cough drops up to 3 times daily for cough.   Cough and congestion:  You can use over the counter Mucinex  DM or Coricidin HBP for cough and congestion   Drainage and congestion: You can use over the counter antihistamine such as zyrtec , allegra, or benadryl as directed on the label   Shortness of breath or wheezing: You can use your albuterol  inhaler 1-2 puffs every 4 hours as needed for wheezing, shortness of breath, chest tightness, or coughing fits.  Caution as this medication can cause jitteriness/shakes.   Nausea: You can use over the counter Emetrol for nausea.     Antiviral medication: Begin medication Paxlovid  to help reduce your risk of hospitalization or severe illness.  If medicaiton is not available, too expensive or not covered by insurance, then call us  back.   Other supportive measures: I recommend extra vitamins to help your body fight the illness.   Consider over the counter vitamin pack such as EmergenC Immune Plus which contains extra vitamin C, vitamin D  and zinc.     In the next few days, if you are having trouble breathing, if you are very weak, have high fever 103 or higher consistently despite Tylenol , or uncontrollable nausea and vomiting, then call or go to the emergency department.    If you have other questions or have other symptoms or questions you are concerned about then please make a virtual visit   Covid symptoms such as fatigue and cough can linger over 2 weeks, even after the initial fever, aches, chills, and  other initial symptoms.   Self Quarantine: The CDC, Centers for Disease Control has recommended a self quarantine of 5 days from the start of your illness until you are symptom-free including at least 24 hours of no symptoms including no fever, no shortness of breath, and no body aches and chills, by day 5 before returning to work or general contact with the public.  What does self quarantine mean: avoiding contact with people as much as possible.   Particularly in your house, isolate your self from others in a separate room, wear a mask when possible in the room, particularly if coughing a lot.   Have others bring food, water, medications, etc., to your door, but avoid direct contact with your household contacts during this time to avoid spreading the infection to them.   If you have a separate bathroom and living quarters during the next 2 weeks away from others, that would be preferable.    If you can't completely isolate, then wear a mask, wash hands frequently with soap and water for at least 15 seconds, minimize close contact with others, and have a friend or family member check regularly from a distance to make sure you are not getting seriously worse.     You should not be going out in public, should not be going to stores, to work or other public places until all your symptoms have resolved and at least 5 days + 24 hours of no symptoms at all have transpired.   Ideally you should avoid contact with others for a full 5 days if possible.  One of the goals is to limit spread to high risk people; people that are older and elderly, people with multiple health issues like diabetes, heart disease, lung disease, and anybody that has weakened immune systems such as people with cancer or on immunosuppressive therapy.      Steph was seen today for sick since saturday.  Diagnoses and all orders for this visit:  Flu-like symptoms  Sore throat  Acute cough  Other  orders -      nirmatrelvir /ritonavir  (PAXLOVID ) 20 x 150 MG & 10 x 100MG  TABS; Take 3 tablets by mouth 2 (two) times daily for 5 days. (Take nirmatrelvir  150 mg two tablets twice daily for 5 days and ritonavir  100 mg one tablet twice daily for 5 days) Patient GFR is 72 -     benzonatate  (TESSALON ) 200 MG capsule; Take 1 capsule (200 mg total) by mouth 3 (three) times daily as needed for cough.  F/u prn

## 2023-06-16 NOTE — Patient Instructions (Signed)
 Positive for COVID, negative for flu and strep  Covid infection, covid illness general recommendations:  I recommend you rest, hydrate well with water and clear fluids throughout the day.  If you feel dry in the mouth, tongue or feel that you are urinating as much as usual, then increase hydration.  You urine should be like yellow to clear, not dark yellow or darker.     Pain, body aches, or fever: You can use Tylenol  /Acetaminophen  325mg  over the counter for pain or fever, every 4-6 hours  Cough: You can use Tessalon /Benzonatate  prescription cough drops up to 3 times daily for cough.   Cough and congestion: You can use over the counter Mucinex  DM or Coricidin HBP for cough and congestion   Drainage and congestion: You can use over the counter antihistamine such as zyrtec , allegra, or benadryl as directed on the label   Shortness of breath or wheezing: You can use your albuterol  inhaler 1-2 puffs every 4 hours as needed for wheezing, shortness of breath, chest tightness, or coughing fits.  Caution as this medication can cause jitteriness/shakes.   Nausea: You can use over the counter Emetrol for nausea.     Antiviral medication: Begin medication Paxlovid  to help reduce your risk of hospitalization or severe illness.  If medicaiton is not available, too expensive or not covered by insurance, then call us  back.   Other supportive measures: I recommend extra vitamins to help your body fight the illness.   Consider over the counter vitamin pack such as EmergenC Immune Plus which contains extra vitamin C, vitamin D  and zinc.     In the next few days, if you are having trouble breathing, if you are very weak, have high fever 103 or higher consistently despite Tylenol , or uncontrollable nausea and vomiting, then call or go to the emergency department.    If you have other questions or have other symptoms or questions you are concerned about then please make a virtual  visit   Covid symptoms such as fatigue and cough can linger over 2 weeks, even after the initial fever, aches, chills, and other initial symptoms.   Self Quarantine: The CDC, Centers for Disease Control has recommended a self quarantine of 5 days from the start of your illness until you are symptom-free including at least 24 hours of no symptoms including no fever, no shortness of breath, and no body aches and chills, by day 5 before returning to work or general contact with the public.  What does self quarantine mean: avoiding contact with people as much as possible.   Particularly in your house, isolate your self from others in a separate room, wear a mask when possible in the room, particularly if coughing a lot.   Have others bring food, water, medications, etc., to your door, but avoid direct contact with your household contacts during this time to avoid spreading the infection to them.   If you have a separate bathroom and living quarters during the next 2 weeks away from others, that would be preferable.    If you can't completely isolate, then wear a mask, wash hands frequently with soap and water for at least 15 seconds, minimize close contact with others, and have a friend or family member check regularly from a distance to make sure you are not getting seriously worse.     You should not be going out in public, should not be going to stores, to work or other public places until all your  symptoms have resolved and at least 5 days + 24 hours of no symptoms at all have transpired.   Ideally you should avoid contact with others for a full 5 days if possible.  One of the goals is to limit spread to high risk people; people that are older and elderly, people with multiple health issues like diabetes, heart disease, lung disease, and anybody that has weakened immune systems such as people with cancer or on immunosuppressive therapy.

## 2023-06-21 ENCOUNTER — Other Ambulatory Visit: Payer: Self-pay | Admitting: Medical

## 2023-06-21 MED ORDER — PREDNISONE 20 MG PO TABS: ORAL_TABLET | ORAL | 0 refills | Status: DC

## 2023-07-08 DIAGNOSIS — Z1231 Encounter for screening mammogram for malignant neoplasm of breast: Secondary | ICD-10-CM | POA: Diagnosis not present

## 2023-07-08 DIAGNOSIS — Z6827 Body mass index (BMI) 27.0-27.9, adult: Secondary | ICD-10-CM | POA: Diagnosis not present

## 2023-07-08 DIAGNOSIS — Z1272 Encounter for screening for malignant neoplasm of vagina: Secondary | ICD-10-CM | POA: Diagnosis not present

## 2023-07-08 DIAGNOSIS — Z124 Encounter for screening for malignant neoplasm of cervix: Secondary | ICD-10-CM | POA: Diagnosis not present

## 2023-07-08 DIAGNOSIS — M8588 Other specified disorders of bone density and structure, other site: Secondary | ICD-10-CM | POA: Diagnosis not present

## 2023-07-08 DIAGNOSIS — N958 Other specified menopausal and perimenopausal disorders: Secondary | ICD-10-CM | POA: Diagnosis not present

## 2023-07-08 LAB — HM DEXA SCAN

## 2023-07-08 LAB — HM MAMMOGRAPHY

## 2023-07-14 ENCOUNTER — Encounter: Payer: Self-pay | Admitting: Internal Medicine

## 2023-07-15 ENCOUNTER — Ambulatory Visit: Payer: PPO | Admitting: Medical

## 2023-07-15 VITALS — BP 110/70 | HR 97 | Ht 66.5 in | Wt 172.0 lb

## 2023-07-15 DIAGNOSIS — Z1389 Encounter for screening for other disorder: Secondary | ICD-10-CM | POA: Diagnosis not present

## 2023-07-15 DIAGNOSIS — R7301 Impaired fasting glucose: Secondary | ICD-10-CM | POA: Insufficient documentation

## 2023-07-15 DIAGNOSIS — F411 Generalized anxiety disorder: Secondary | ICD-10-CM | POA: Diagnosis not present

## 2023-07-15 DIAGNOSIS — I700 Atherosclerosis of aorta: Secondary | ICD-10-CM

## 2023-07-15 DIAGNOSIS — M8580 Other specified disorders of bone density and structure, unspecified site: Secondary | ICD-10-CM

## 2023-07-15 DIAGNOSIS — Z1509 Genetic susceptibility to other malignant neoplasm: Secondary | ICD-10-CM

## 2023-07-15 DIAGNOSIS — I8393 Asymptomatic varicose veins of bilateral lower extremities: Secondary | ICD-10-CM | POA: Diagnosis not present

## 2023-07-15 DIAGNOSIS — I10 Essential (primary) hypertension: Secondary | ICD-10-CM

## 2023-07-15 DIAGNOSIS — J4520 Mild intermittent asthma, uncomplicated: Secondary | ICD-10-CM

## 2023-07-15 DIAGNOSIS — Z0000 Encounter for general adult medical examination without abnormal findings: Secondary | ICD-10-CM

## 2023-07-15 DIAGNOSIS — E785 Hyperlipidemia, unspecified: Secondary | ICD-10-CM

## 2023-07-15 DIAGNOSIS — G4700 Insomnia, unspecified: Secondary | ICD-10-CM | POA: Diagnosis not present

## 2023-07-15 LAB — POCT URINALYSIS DIP (PROADVANTAGE DEVICE)
Bilirubin, UA: NEGATIVE
Blood, UA: NEGATIVE
Glucose, UA: NEGATIVE mg/dL
Ketones, POC UA: NEGATIVE mg/dL
Leukocytes, UA: NEGATIVE
Nitrite, UA: NEGATIVE
Protein Ur, POC: NEGATIVE mg/dL
Specific Gravity, Urine: 1.025
Urobilinogen, Ur: NEGATIVE
pH, UA: 6 (ref 5.0–8.0)

## 2023-07-15 NOTE — Progress Notes (Signed)
 Subjective:    Shelby Randolph is a 73 y.o. female who presents for well visit.  Chief Complaint  Patient presents with   Annual Exam    Fasting cpe, bp is still taking high, Dr. Jonny Ruiz McCombs recommends fosamax to see if it would help.     Primary Care Provider Armonte Tortorella, Kermit Balo, PA-C here for primary care  Current Health Care Team: Dentist, Dr. Leanord Asal  Eye doctor, Dr. Pearlean Brownie Dr. Stan Head, gastroenterology Dr. Rosemary Holms, Dr. Tessa Lerner, and Donnelly Stager nurse practitioner at [facility] cardiovascular Dr. Sharyn Lull at dermatology Dr. Arelia Sneddon, [facility] Dr. Alberteen Sam, orthopedics, [facility]  Depression Screen    07/15/2023    8:19 AM  Depression screen PHQ 2/9  Decreased Interest 0  Down, Depressed, Hopeless 0  PHQ - 2 Score 0    Activities of Daily Living Screen/Functional Status Survey     Fall Risk Screen    07/15/2023    8:19 AM 07/14/2022    8:32 AM 07/11/2021    8:18 AM 06/25/2021    1:18 PM 01/21/2021   11:08 AM  Fall Risk   Falls in the past year? 1 1 0 0 0  Number falls in past yr: 0 0 0 0 0  Injury with Fall? 1 1 0 0 0  Risk for fall due to : Impaired balance/gait Impaired balance/gait No Fall Risks No Fall Risks No Fall Risks  Follow up Falls evaluation completed Falls evaluation completed Falls evaluation completed Falls evaluation completed Falls evaluation completed    Advanced directives Does patient have a Health Care Power of Attorney? Yes Does patient have a Living Will? Yes  Past Medical History:  Diagnosis Date   Abnormal screening cardiac CT 11/2011   coronary calcium score 13, 24% percentile, mild plaque in prox LAD and ostial RCA   Adenomatous colon polyp    Dr. Stan Head   Allergy    Anemia    Anxiety    Arthritis    Asthma    Atherosclerosis of aorta (HCC) 03/2014   abnormal on ultrasound, repeat US within 5 years   Cancer [facility])    Skin cancer squamous   Depression    Diverticulosis    Eye injury    in  college, some pertinent loss of vision in left eye   Genital herpes    GERD (gastroesophageal reflux disease)    H/O echocardiogram 10/28/2018   LVEF 45-50%, impaired diastolic dysfunction, mild mitral and tricuspid regurgitation; Dr. Rosemary Holms   History of bone density study 09/2011   normal   History of mammogram    Dr. Arelia Sneddon   History of melanoma    sees Carollee Herter, dermatology   History of PFTs 01/20/2011   moderate airflow limitation, no significant response to bronchodilator   Hyperlipidemia    Hypertension 12/2011   Insomnia    Migraines    Dr. Vela Prose, migraines- PAST HX   MSH6-related Lynch syndrome (HNPCC5) 08/20/2017   Nearsightedness    wears glasses   Osteopenia 2020   Post-operative nausea and vomiting    Routine gynecological examination    Dr. Mariea Stable virus disease 06/2014    Past Surgical History:  Procedure Laterality Date   COLONOSCOPY  2016   Dr. Stan Head, 2014 Dr. Kinnie Scales   COLONOSCOPY  01/2019   weak anal sphincter tone, moderate rectocele, polyp, scattered diverticula; Dr. Stan Head   COLONOSCOPY  2023   CG-miralax(prep adeq)   ELBOW SURGERY Left  03/2022   ESOPHAGOGASTRODUODENOSCOPY  11/2017   normal, Dr. Stan Head   POLYPECTOMY     ROTATOR CUFF REPAIR Right 05/2010   TONSILLECTOMY AND ADENOIDECTOMY     as a kid   TUBAL LIGATION  1985   VAGINAL HYSTERECTOMY  1998   total; abnormal peroids   VARICOSE VEIN SURGERY     bilat     Family History  Problem Relation Age of Onset   COPD Mother        died age 28yo.  emphysema   Colon polyps Father    Colon cancer Father 80        died age 60yo   Colon polyps Brother    Colon cancer Brother 40   Breast cancer Maternal Aunt    Colon polyps Paternal Uncle    Colon cancer Paternal Uncle        2   Colon polyps Paternal Grandmother    Colon cancer Paternal Grandmother 27       colon cancer    Breast cancer Daughter    Diabetes Neg Hx    Heart disease Neg Hx     Hypertension Neg Hx    Stroke Neg Hx    Rectal cancer Neg Hx    Stomach cancer Neg Hx    Esophageal cancer Neg Hx      Current Outpatient Medications:    acetaminophen (TYLENOL) 500 MG tablet, 1-2 tablets twice to three times daily as needed., Disp: 100 tablet, Rfl: 5   acyclovir (ZOVIRAX) 200 MG capsule, Take 200 mg by mouth daily as needed., Disp: , Rfl:    albuterol (VENTOLIN HFA) 108 (90 Base) MCG/ACT inhaler, Inhale 2 puffs into the lungs every 6 (six) hours as needed for wheezing or shortness of breath., Disp: , Rfl:    alosetron (LOTRONEX) 0.5 MG tablet, TAKE ONE TABLET BY MOUTH TWICE DAILY, Disp: 60 tablet, Rfl: 11   amLODipine (NORVASC) 5 MG tablet, Take 1 tablet (5 mg total) by mouth daily., Disp: 30 tablet, Rfl: 1   benzonatate (TESSALON) 200 MG capsule, Take 1 capsule (200 mg total) by mouth 3 (three) times daily as needed for cough., Disp: 30 capsule, Rfl: 0   cetirizine (ZYRTEC) 10 MG tablet, Take 10 mg by mouth daily., Disp: , Rfl:    cholecalciferol (VITAMIN D3) 25 MCG (1000 UNIT) tablet, Take 1 tablet (1,000 Units total) by mouth daily., Disp: 90 tablet, Rfl: 3   Multiple Vitamin (MULTIVITAMIN ADULT PO), Take 1 tablet by mouth daily., Disp: , Rfl:    olmesartan (BENICAR) 40 MG tablet, Take 1 tablet (40 mg total) by mouth daily., Disp: 90 tablet, Rfl: 3   Olopatadine HCl (PATADAY) 0.2 % SOLN, Apply 1 drop to eye in the morning and at bedtime., Disp: 2.5 mL, Rfl: 0   Probiotic Product (PRO-BIOTIC BLEND PO), Take 1 tablet by mouth daily., Disp: , Rfl:    triamcinolone cream (KENALOG) 0.1 %, Apply 1 Application topically 2 (two) times daily. (Patient taking differently: Apply 1 Application topically 2 (two) times daily as needed (rash).), Disp: 30 g, Rfl: 0   zolpidem (AMBIEN) 10 MG tablet, TAKE 1/2 TO 1 TABLET AT BEDTIME, Disp: 90 tablet, Rfl: 0   aspirin EC 81 MG tablet, Take 1 tablet (81 mg total) by mouth daily. (Patient not taking: Reported on 07/15/2023), Disp: 90 tablet, Rfl:  3   atorvastatin (LIPITOR) 40 MG tablet, Take 1 tablet (40 mg total) by mouth daily., Disp: 90 tablet, Rfl: 3  Allergies  Allergen Reactions   Dust Mite Extract Cough and Other (See Comments)   Misc. Sulfonamide Containing Compounds Anaphylaxis    Other Reaction(s): irregular heart rate   Sulfonamide Derivatives Anaphylaxis   Guaifenesin Other (See Comments)    "Jittery and hypes me up"   Mucinex [Guaifenesin Er] Other (See Comments)    "Jittery and hypes me up"   Fluoxetine     History reviewed: allergies, current medications, past family history, past medical history, past social history, past surgical history and problem list  Chronic issues discussed: Asthma-she has used her albuterol recently with viral illness.  In general has not had to use the albuterol on a regular basis prior to that illness  Hyperlipidemia-compliant with statin atorvastatin 40 mg daily  Hypertension-she just started back on amlodipine 5 mg yesterday after our email conversation, she continues on olmesartan 40 mg daily  Insomnia-continues on Ambien without problem.  Advise she only use 5 mg daily going forward given the risk.  She continues on vitamin D over-the-counter 1000 units daily supplement  She has IBS diarrhea dominant, she continues on Lotronex per GI.  In the past year she tried some therapy maneuvers at pelvic floor therapist but did not like that and it did not seem to help.   Acute issues discussed: none    Objective:     Biometrics BP 110/70   Pulse 97   Ht 5' 6.5" (1.689 m)   Wt 172 lb (78 kg)   BMI 27.35 kg/m   Wt Readings from Last 3 Encounters:  07/15/23 172 lb (78 kg)  06/16/23 165 lb (74.8 kg)  01/27/23 171 lb 9.6 oz (77.8 kg)   Gen: wd, wn, nad HEENT: normocephalic, sclerae anicteric Neck: supple, no lymphadenopathy, no thyromegaly, no masses, no bruits Heart: RRR, normal S1, S2, no murmurs Lungs: CTA bilaterally, no wheezes, rhonchi, or rales Abdomen: +bs,  soft, non tender, non distended, no masses, no hepatomegaly, no splenomegaly Musculoskeletal: surgical scar left posterior elbow, guarded with this elbow and arm, +contracture, mild of right hand volar surface affecting 3rd and 4th digits along mid to distal metacarpals, no flexion deformity, otherwise UE and LE nontender, no swelling, no obvious deformity Extremities: moderate varicose veins bilat LE, no edema, no cyanosis, no clubbing Pulses: 2+ symmetric, upper and lower extremities, normal cap refill Neurological: alert, oriented x 3, CN2-12 intact, strength normal upper extremities and lower extremities, sensation normal throughout, DTRs 2+ throughout, no cerebellar signs, gait normal Psychiatric: normal affect, behavior normal, pleasant  GU/rectal/breast - deferred to gynecology Skin : scattered macules, oval 1.3 cm x 6mm verruca raised appearing lesion of right upper outer orbit, raised verruca lesion medial right ankle   Nuclear stress test September 2021 Nondiagnostic ECG stress. There is a fixed moderate defect in the inferior and apical regions suggestive of soft tissue attenuation without ischemia or scar.  Overall LV systolic function is normal without regional wall motion abnormalities. Stress LV EF: 61%.  No previous exam available for comparison. Low risk. Dr. Yates Decamp   Echocardiogram 12/2019 1. Left ventricle cavity is normal in size. Mild concentric hypertrophy of the left ventricle. Mildly global hypokinesis. LVEF 45-50%. Normal global wall motion. Doppler evidence of grade I (impaired) diastolic dysfunction, normal LAP. 2. Mild (Grade I) mitral regurgitation. 3. Mild tricuspid regurgitation. 4. No evidence of pulmonary hypertension. 5. No significant change compared to previous study on 10/28/2018. Conclusions: Truett Mainland, MD     Assessment:   Encounter Diagnoses  Name Primary?  Encounter for health maintenance examination in adult Yes   Mild  intermittent asthma without complication    Atherosclerosis of abdominal aorta (HCC)    Dyslipidemia    Essential hypertension    Varicose veins of both lower extremities, unspecified whether complicated    Osteopenia, unspecified location    Lynch syndrome    Insomnia, unspecified type    Generalized anxiety disorder    Impaired fasting blood sugar    Screening for hematuria or proteinuria       Plan:   A preventative services visit was completed today.  During the course of the visit today, we discussed and counseled about appropriate screening and preventive services.  A health risk assessment was established today that included a review of current medications, allergies, social history, family history, medical and preventative health history, biometrics, and preventative screenings to identify potential safety concerns or impairments.  A personalized plan was printed today for your records and use.   Personalized health advice and education was given today to reduce health risks and promote self management and wellness.  Information regarding end of life planning was discussed today.   Recommendations: Continue to return yearly for your annual wellness and preventative care visits.  This gives [place] a chance to discuss healthy lifestyle, exercise, vaccinations, review your chart record, and perform screenings where appropriate.  I recommend you see your eye doctor yearly for routine vision care.  I recommend you see your dentist yearly for routine dental care including hygiene visits twice yearly.  See your gynecologist yearly for routine gynecological care.   Vaccination recommendations were reviewed Immunization History  Administered Date(s) Administered   Fluad Quad(high Dose 65+) 02/24/2020, 01/28/2022   Hep B, Unspecified 10/09/2017   Hepatitis A 12/09/2017   Hepatitis A, Adult 10/09/2017   Hepatitis B 10/09/2017, 12/09/2017   Influenza Split 02/16/2011, 02/11/2021    Influenza Whole 02/21/2013   Influenza, High Dose Seasonal PF 01/22/2023   Influenza-Unspecified 02/20/2014, 02/20/2015, 01/26/2017, 02/08/2018, 02/17/2019   Meningococcal Conjugate 06/12/2017   PFIZER(Purple Top)SARS-COV-2 Vaccination 06/15/2019, 07/10/2019, 01/26/2020   Pfizer Covid-19 Vaccine Bivalent Booster 34yrs & up 08/16/2020, 01/22/2023   Pfizer(Comirnaty)Fall Seasonal Vaccine 12 years and older 07/14/2022   Pneumococcal Conjugate-13 05/23/2015   Pneumococcal Polysaccharide-23 02/03/2012, 07/07/2018   Pneumococcal-Unspecified 06/12/2017   RSV,unspecified 01/22/2023   Td 07/18/2007   Tdap 01/16/2011, 05/11/2017   Varicella 04/10/2012   Zoster Recombinant(Shingrix) 12/15/2017, 02/13/2018   Zoster, Live 03/18/2011, 12/09/2017   Vaccines up to date   Screening for cancer: Breast cancer screening: You should perform a self breast exam monthly.   Mammogram per gynecology, continue yearly mammogram due age 17.  Just had mammogram last week.  Colon cancer screening:  I reviewed your colonoscopy from May 2024, 2 polyps in the sigmoid colon and in the ascending colon, severe diverticulosis in the entire colon, advise repeat colonoscopy this year in 2025 given history of Lynch syndrome  Skin cancer screening: Check your skin regularly for new changes, growing lesions, or other lesions of concern Continue routine follow-up with dermatology  Lung cancer screening: If you have a greater than 30 pack year history of tobacco use, then you qualify for lung cancer screening with a chest CT scan  We currently don't have screenings for other cancers besides breast, cervical, colon, and lung cancers.  If you have a strong family history of cancer or have other cancer screening concerns, please let me know.    Bone health: Get at least 150 minutes of aerobic  exercise weekly Get weight bearing exercise at least once weekly 2022 bone density test showed osteopenia.   We will request bone  density test from last week.   Heart health: Get at least 150 minutes of aerobic exercise weekly Limit alcohol It is important to maintain a healthy blood pressure and healthy cholesterol numbers  She sees cardiology.  August 2023 echocardiogram shows normal LV function, EF 55 to 60%, mild LVH, normal global wall motion, pericardium mildly thickened, small to moderate pericardial effusion.    Separate significant issues discussed: Anxiety -didn't tolerate fluoxetine last year  Aortic atherosclerosis, hyperlipidemia-continue statin, Atorvastatin 40mg  daily, labs today  hypertension - continue Olmesartan 40mg  daily, and she just added back Amlodipine 5mg  daily  Osteopenia-continue vitamin D supplement, get weightbearing exercise and aerobic activity, and just started Fosamax per gynecology.  We will request recent bone density test  Actinic keratosis-continue routine surveillance with dermatology  Varicose veins- consider compression hose.  Continue regular exercise  Insomnia-uses Ambien without any major issues.  Change to 5mg  going forward.  She is aware of risk versus benefits  Asthma-doing fine on albuterol as needed.    Allergic rhinitis-continue zyrtec  IBS-on medication Lotronex, completed pelvic floor PT last year, follow up with GI since she still is having issues and incontinence.    Alaiya was seen today for annual exam.  Diagnoses and all orders for this visit:  Encounter for health maintenance examination in adult -     CBC -     Comprehensive metabolic panel -     Lipid panel -     TSH -     Hemoglobin A1c -     POCT Urinalysis DIP (Proadvantage Device)  Mild intermittent asthma without complication  Atherosclerosis of abdominal aorta (HCC)  Dyslipidemia -     Lipid panel  Essential hypertension  Varicose veins of both lower extremities, unspecified whether complicated  Osteopenia, unspecified location  Lynch syndrome  Insomnia, unspecified  type  Generalized anxiety disorder  Impaired fasting blood sugar -     Hemoglobin A1c  Screening for hematuria or proteinuria -     POCT Urinalysis DIP (Proadvantage Device)     Follow up pending labs

## 2023-07-16 ENCOUNTER — Other Ambulatory Visit: Payer: Self-pay | Admitting: Medical

## 2023-07-16 LAB — CBC
Hematocrit: 35.9 % (ref 34.0–46.6)
Hemoglobin: 12.4 g/dL (ref 11.1–15.9)
MCH: 30.2 pg (ref 26.6–33.0)
MCHC: 34.5 g/dL (ref 31.5–35.7)
MCV: 88 fL (ref 79–97)
Platelets: 163 10*3/uL (ref 150–450)
RBC: 4.1 x10E6/uL (ref 3.77–5.28)
RDW: 13.3 % (ref 11.7–15.4)
WBC: 3.5 10*3/uL (ref 3.4–10.8)

## 2023-07-16 LAB — COMPREHENSIVE METABOLIC PANEL
ALT: 21 IU/L (ref 0–32)
AST: 24 IU/L (ref 0–40)
Albumin: 4.4 g/dL (ref 3.8–4.8)
Alkaline Phosphatase: 80 IU/L (ref 44–121)
BUN/Creatinine Ratio: 30 — ABNORMAL HIGH (ref 12–28)
BUN: 24 mg/dL (ref 8–27)
Bilirubin Total: 0.6 mg/dL (ref 0.0–1.2)
CO2: 22 mmol/L (ref 20–29)
Calcium: 9.4 mg/dL (ref 8.7–10.3)
Chloride: 105 mmol/L (ref 96–106)
Creatinine, Ser: 0.79 mg/dL (ref 0.57–1.00)
Globulin, Total: 2 g/dL (ref 1.5–4.5)
Glucose: 109 mg/dL — ABNORMAL HIGH (ref 70–99)
Potassium: 4.4 mmol/L (ref 3.5–5.2)
Sodium: 141 mmol/L (ref 134–144)
Total Protein: 6.4 g/dL (ref 6.0–8.5)
eGFR: 79 mL/min/{1.73_m2} (ref >59)

## 2023-07-16 LAB — LIPID PANEL
Chol/HDL Ratio: 2.5 ratio (ref 0.0–4.4)
Cholesterol, Total: 181 mg/dL (ref 100–199)
HDL: 72 mg/dL (ref >39)
LDL Chol Calc (NIH): 95 mg/dL (ref 0–99)
Triglycerides: 77 mg/dL (ref 0–149)
VLDL Cholesterol Cal: 14 mg/dL (ref 5–40)

## 2023-07-16 LAB — HM PAP SMEAR

## 2023-07-16 LAB — HEMOGLOBIN A1C
Est. average glucose Bld gHb Est-mCnc: 111 mg/dL
Hgb A1c MFr Bld: 5.5 % (ref 4.8–5.6)

## 2023-07-16 LAB — TSH: TSH: 1.99 u[IU]/mL (ref 0.450–4.500)

## 2023-07-16 MED ORDER — ATORVASTATIN CALCIUM 40 MG PO TABS: 40.0000 mg | ORAL_TABLET | Freq: Every day | ORAL | 3 refills | Status: AC

## 2023-07-16 MED ORDER — OLMESARTAN MEDOXOMIL 40 MG PO TABS
40.0000 mg | ORAL_TABLET | Freq: Every day | ORAL | 3 refills | Status: AC
Start: 2023-07-16 — End: ?

## 2023-07-16 MED ORDER — ASPIRIN 81 MG PO TBEC: 81.0000 mg | DELAYED_RELEASE_TABLET | Freq: Every day | ORAL | 3 refills | Status: AC

## 2023-07-16 MED ORDER — TRIAMCINOLONE ACETONIDE 0.1 % EX CREA: 1.0000 | TOPICAL_CREAM | Freq: Two times a day (BID) | CUTANEOUS | 1 refills | Status: DC | PRN

## 2023-07-16 NOTE — Progress Notes (Signed)
 Results sent through MyChart

## 2023-07-21 ENCOUNTER — Encounter: Payer: Self-pay | Admitting: Internal Medicine

## 2023-08-10 DIAGNOSIS — L820 Inflamed seborrheic keratosis: Secondary | ICD-10-CM | POA: Diagnosis not present

## 2023-08-16 ENCOUNTER — Other Ambulatory Visit: Payer: Self-pay | Admitting: Medical

## 2023-08-16 MED ORDER — VALACYCLOVIR HCL 1 G PO TABS: 2000.0000 mg | ORAL_TABLET | Freq: Two times a day (BID) | ORAL | 0 refills | Status: AC

## 2023-09-06 ENCOUNTER — Other Ambulatory Visit: Payer: Self-pay | Admitting: Medical

## 2023-09-16 ENCOUNTER — Encounter: Payer: Self-pay | Admitting: Gastroenterology

## 2023-09-16 ENCOUNTER — Ambulatory Visit (AMBULATORY_SURGERY_CENTER)

## 2023-09-16 ENCOUNTER — Encounter: Payer: Self-pay | Admitting: Internal Medicine

## 2023-09-16 VITALS — Ht 66.5 in | Wt 165.0 lb

## 2023-09-16 DIAGNOSIS — Z1509 Genetic susceptibility to other malignant neoplasm: Secondary | ICD-10-CM

## 2023-09-16 DIAGNOSIS — Z860100 Personal history of colon polyps, unspecified: Secondary | ICD-10-CM

## 2023-09-16 MED ORDER — ONDANSETRON HCL 4 MG PO TABS: 4.0000 mg | ORAL_TABLET | Freq: Three times a day (TID) | ORAL | 1 refills | Status: AC | PRN

## 2023-09-16 NOTE — Progress Notes (Signed)
 No egg or soy allergy known to patient  No issues known to pt with past sedation with any surgeries or procedures Patient denies ever being told they had issues or difficulty with intubation  No FH of Malignant Hyperthermia Pt is not on diet pills nor GLP-1 medications Pt is not on home 02  Pt is not on blood thinners  Pt denies issues with constipation  No A fib or A flutter Have any cardiac testing pending--no Ambulates independently

## 2023-09-30 ENCOUNTER — Other Ambulatory Visit: Payer: Self-pay | Admitting: Medical

## 2023-09-30 MED ORDER — VALACYCLOVIR HCL 1 G PO TABS: 1000.0000 mg | ORAL_TABLET | Freq: Every day | ORAL | 0 refills | Status: AC

## 2023-10-06 NOTE — Progress Notes (Unsigned)
 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 female with Lynch syndrome, last year she had a duodenal adenoma and EGD and she had an SSP and hyperplastic polyp at her colonoscopy.  She is here for surveillance exams.   Past Medical History:  Diagnosis Date   Abnormal screening cardiac CT 11/2011   coronary calcium  score 13, 24% percentile, mild plaque in prox LAD and ostial RCA   Adenomatous colon polyp    Dr. Loy Ruff   Allergy    Anemia    Anxiety    Arthritis    Asthma    Atherosclerosis of aorta (HCC) 03/2014   abnormal on ultrasound, repeat US  within 5 years   Cancer [facility])    Skin cancer squamous   Depression    Diverticulosis    Eye injury    in college, some pertinent loss of vision in left eye   Genital herpes    GERD (gastroesophageal reflux disease)    H/O echocardiogram 10/28/2018   LVEF 45-50%, impaired diastolic dysfunction, mild mitral and tricuspid regurgitation; Dr. Filiberto Hug   History of bone density study 09/2011   normal   History of mammogram    Dr. Cloretta Danes   History of melanoma    sees Benita Bramble, dermatology   History of PFTs 01/20/2011   moderate airflow limitation, no significant response to bronchodilator   Hyperlipidemia    Hypertension 12/2011   Insomnia    Migraines    Dr. Lamar Pillar, migraines- PAST HX   MSH6-related Lynch syndrome (HNPCC5) 08/20/2017   Nearsightedness    wears glasses   Osteopenia 2020   Post-operative nausea and vomiting    Routine gynecological examination    Dr. Robertha China virus disease 06/2014    Past Surgical History:  Procedure Laterality Date   COLONOSCOPY  2016   Dr. Loy Ruff, 2014 Dr. Andriette Keeling   COLONOSCOPY  01/2019   weak anal sphincter tone, moderate rectocele,  polyp, scattered diverticula; Dr. Loy Ruff   COLONOSCOPY  2023   CG-miralax(prep adeq)   ELBOW SURGERY Left 03/2022   ESOPHAGOGASTRODUODENOSCOPY  11/2017   normal, Dr. Loy Ruff   POLYPECTOMY     ROTATOR CUFF REPAIR Right 05/2010   TONSILLECTOMY AND ADENOIDECTOMY     as a kid   TUBAL LIGATION  1985   VAGINAL HYSTERECTOMY  1998   total; abnormal peroids   VARICOSE VEIN SURGERY     bilat    Prior to Admission medications   Medication Sig Start Date End Date Taking? Authorizing Provider  acetaminophen  (TYLENOL ) 500 MG tablet 1-2 tablets twice to three times daily as needed. 02/03/12   Tysinger, Christiane Cowing, PA-C  albuterol  (VENTOLIN  HFA) 108 (90 Base) MCG/ACT inhaler Inhale 2 puffs into the lungs every 6 (six) hours as needed for wheezing or shortness of breath. 12/06/21   [provider]  alendronate (FOSAMAX) 70 MG/75ML solution Take 70 mg by mouth every 7 (seven) days. Take with a full glass of water on an empty stomach.    [provider]  alosetron  (LOTRONEX ) 0.5 MG tablet TAKE ONE TABLET BY MOUTH TWICE DAILY 11/16/22   Kenney Peacemaker, MD  amLODipine  (NORVASC ) 5 MG tablet Take 1 tablet (5 mg  total) by mouth daily. 01/27/23   Tysinger, Christiane Cowing, PA-C  aspirin  EC 81 MG tablet Take 1 tablet (81 mg total) by mouth daily. 07/16/23   Tysinger, Christiane Cowing, PA-C  atorvastatin  (LIPITOR) 40 MG tablet Take 1 tablet (40 mg total) by mouth daily. 07/16/23 07/15/24  Tysinger, Christiane Cowing, PA-C  benzonatate  (TESSALON ) 200 MG capsule Take 1 capsule (200 mg total) by mouth 3 (three) times daily as needed for cough. Patient not taking: Reported on 09/16/2023 06/16/23   Claudene Crystal, PA-C  cetirizine  (ZYRTEC ) 10 MG tablet Take 10 mg by mouth daily.    [provider]  cholecalciferol (VITAMIN D3) 25 MCG (1000 UNIT) tablet Take 1 tablet (1,000 Units total) by mouth daily. 07/11/20   Tysinger, Christiane Cowing, PA-C  Multiple Vitamin (MULTIVITAMIN ADULT PO) Take 1 tablet by mouth daily.    [provider]  olmesartan  (BENICAR ) 40 MG tablet Take 1 tablet (40 mg total) by mouth daily. 07/16/23   Tysinger, Christiane Cowing, PA-C  Olopatadine  HCl (PATADAY ) 0.2 % SOLN Apply 1 drop to eye in the morning and at bedtime. Patient taking differently: Apply 1 drop to eye 2 (two) times daily as needed. 09/07/22   Tysinger, Christiane Cowing, PA-C  ondansetron  (ZOFRAN ) 4 MG tablet Take 1 tablet (4 mg total) by mouth every 8 (eight) hours as needed for nausea or vomiting. 09/16/23   Kenney Peacemaker, MD  Probiotic Product (PRO-BIOTIC BLEND PO) Take 1 tablet by mouth daily.    [provider]  valACYclovir  (VALTREX ) 1000 MG tablet Take 1 tablet (1,000 mg total) by mouth daily. 09/30/23   Tysinger, Christiane Cowing, PA-C  zolpidem  (AMBIEN ) 10 MG tablet TAKE 1/2 TO 1 TABLET AT BEDTIME 09/06/23   Tysinger, Christiane Cowing, PA-C    Current Outpatient Medications  Medication Sig Dispense Refill   acetaminophen  (TYLENOL ) 500 MG tablet 1-2 tablets twice to three times daily as needed. 100 tablet 5   alendronate (FOSAMAX) 70 MG/75ML solution Take 70 mg by mouth every 7 (seven) days. Take with a full glass of water on an empty stomach.     alosetron  (LOTRONEX ) 0.5 MG tablet TAKE ONE TABLET BY MOUTH TWICE DAILY 60 tablet 11   atorvastatin  (LIPITOR) 40 MG tablet Take 1 tablet (40 mg total) by mouth daily. 90 tablet 3   cetirizine  (ZYRTEC ) 10 MG tablet Take 10 mg by mouth daily.     cholecalciferol (VITAMIN D3) 25 MCG (1000 UNIT) tablet Take 1 tablet (1,000 Units total) by mouth daily. 90 tablet 3   fluorouracil (EFUDEX) 5 % cream 1 Application.     Multiple Vitamin (MULTIVITAMIN ADULT PO) Take 1 tablet by mouth daily.     olmesartan  (BENICAR ) 40 MG tablet Take 1 tablet (40 mg total) by mouth daily. 90 tablet 3   ondansetron  (ZOFRAN ) 4 MG tablet Take 1 tablet (4 mg total) by mouth every 8 (eight) hours as needed for nausea or vomiting. 30 tablet 1   Probiotic Product (PRO-BIOTIC BLEND PO) Take 1 tablet by mouth daily.     triamcinolone   cream (KENALOG ) 0.1 % 1 Application.     valACYclovir  (VALTREX ) 1000 MG tablet Take 1 tablet (1,000 mg total) by mouth daily. 30 tablet 0   zolpidem  (AMBIEN ) 10 MG tablet TAKE 1/2 TO 1 TABLET AT BEDTIME 90 tablet 0   albuterol  (VENTOLIN  HFA) 108 (90 Base) MCG/ACT inhaler Inhale 2 puffs into the lungs every 6 (six) hours as needed for wheezing or shortness of breath.  aspirin  EC 81 MG tablet Take 1 tablet (81 mg total) by mouth daily. 90 tablet 3   benzonatate  (TESSALON ) 200 MG capsule Take 1 capsule (200 mg total) by mouth 3 (three) times daily as needed for cough. (Patient not taking: Reported on 10/07/2023) 30 capsule 0   Current Facility-Administered Medications  Medication Dose Route Frequency Provider Last Rate Last Admin   0.9 %  sodium chloride  infusion  500 mL Intravenous Continuous Kenney Peacemaker, MD        Allergies as of 10/07/2023 - Review Complete 10/07/2023  Allergen Reaction Noted   Misc. sulfonamide containing compounds Anaphylaxis 09/09/2022   Sulfonamide derivatives Anaphylaxis 11/28/2014   Dust mite extract Other (See Comments) and Cough 05/11/1954   Guaifenesin  Other (See Comments) 05/09/2012   Mucinex  [guaifenesin  er] Other (See Comments) 05/09/2012   Fluoxetine  Other (See Comments) 07/15/2023    Family History  Problem Relation Age of Onset   COPD Mother        died age 66yo.  emphysema   Colon polyps Father    Colon cancer Father 45        died age 82yo   Colon polyps Brother    Colon cancer Brother 15   Breast cancer Maternal Aunt    Colon polyps Paternal Uncle    Colon cancer Paternal Uncle        2   Colon polyps Paternal Grandmother    Colon cancer Paternal Grandmother 70       colon cancer    Breast cancer Daughter    Diabetes Neg Hx    Heart disease Neg Hx    Hypertension Neg Hx    Stroke Neg Hx    Rectal cancer Neg Hx    Stomach cancer Neg Hx    Esophageal cancer Neg Hx     Social History   Socioeconomic History   Marital status:  Widowed    Spouse name: Not on file   Number of children: 5   Years of education: Not on file   Highest education level: Not on file  Occupational History   Occupation: compliance agent (RN)    Employer: [employer]    Comment: Nurse Practitioner  Tobacco Use   Smoking status: Never   Smokeless tobacco: Never  Vaping Use   Vaping status: Never Used  Substance and Sexual Activity   Alcohol use: Not Currently    Comment: occasional   Drug use: No   Sexual activity: Not on file    Comment: exercise - walks[place], cardio at gym 3 x/wk, yoga, widowed   Other Topics Concern   Not on file  Social History Narrative   Lives alone, has 5 children ([place], [place], [place] , and [place]), has several grandchildren, daughter Cleveland Dales in [place] in the [employer], exercises 5 days per week - walking, kickboxing, yoga; [employer];  Works for [employer] in compliance - retired there and now [employer]   Social Drivers of Health   Financial Resource Strain: Not on file  Food Insecurity: Not on file  Transportation Needs: Not on file  Physical Activity: Not on file  Stress: Not on file  Social Connections: Not on file  Intimate Partner Violence: Not on file    Review of Systems:  All other review of systems negative except as mentioned in the HPI.  Physical Exam: Vital signs BP (!) 141/94   Pulse 77   Temp 97.7 F (36.5 C) (Temporal)   Ht  5' 6.5" (1.689 m)   Wt 165 lb (74.8 kg)   SpO2 98%   BMI 26.23 kg/m   General:   Alert,  Well-developed, well-nourished, pleasant and cooperative in NAD Lungs:  Clear throughout to auscultation.   Heart:  Regular rate and rhythm; no murmurs, clicks, rubs,  or gallops. Abdomen:  Soft, nontender and nondistended. Normal bowel sounds.   Neuro/Psych:  Alert and cooperative. Normal mood and affect. A and O x 3   @[place]  Tammie Fall, MD, [facility] [phone] (pager) 10/07/2023 8:00 AM@

## 2023-10-07 ENCOUNTER — Encounter: Payer: Self-pay | Admitting: Internal Medicine

## 2023-10-07 ENCOUNTER — Ambulatory Visit: Admitting: Medical

## 2023-10-07 ENCOUNTER — Ambulatory Visit: Admitting: Internal Medicine

## 2023-10-07 VITALS — BP 155/83 | HR 57 | Temp 97.7°F | Resp 14 | Ht 66.5 in | Wt 165.0 lb

## 2023-10-07 DIAGNOSIS — Z1211 Encounter for screening for malignant neoplasm of colon: Secondary | ICD-10-CM

## 2023-10-07 DIAGNOSIS — Z860101 Personal history of adenomatous and serrated colon polyps: Secondary | ICD-10-CM | POA: Diagnosis not present

## 2023-10-07 DIAGNOSIS — Z860100 Personal history of colon polyps, unspecified: Secondary | ICD-10-CM | POA: Diagnosis not present

## 2023-10-07 DIAGNOSIS — K6289 Other specified diseases of anus and rectum: Secondary | ICD-10-CM

## 2023-10-07 DIAGNOSIS — K5730 Diverticulosis of large intestine without perforation or abscess without bleeding: Secondary | ICD-10-CM

## 2023-10-07 DIAGNOSIS — F32A Depression, unspecified: Secondary | ICD-10-CM | POA: Diagnosis not present

## 2023-10-07 DIAGNOSIS — K635 Polyp of colon: Secondary | ICD-10-CM | POA: Diagnosis not present

## 2023-10-07 DIAGNOSIS — I10 Essential (primary) hypertension: Secondary | ICD-10-CM | POA: Diagnosis not present

## 2023-10-07 DIAGNOSIS — Z1509 Genetic susceptibility to other malignant neoplasm: Secondary | ICD-10-CM

## 2023-10-07 DIAGNOSIS — D124 Benign neoplasm of descending colon: Secondary | ICD-10-CM | POA: Diagnosis not present

## 2023-10-07 DIAGNOSIS — J45909 Unspecified asthma, uncomplicated: Secondary | ICD-10-CM | POA: Diagnosis not present

## 2023-10-07 DIAGNOSIS — F419 Anxiety disorder, unspecified: Secondary | ICD-10-CM | POA: Diagnosis not present

## 2023-10-07 MED ORDER — SODIUM CHLORIDE 0.9 % IV SOLN: 500.0000 mL | INTRAVENOUS | Status: DC

## 2023-10-07 NOTE — Progress Notes (Signed)
 Called to room to assist during endoscopic procedure.  Patient ID and intended procedure confirmed with present staff. Received instructions for my participation in the procedure from the performing physician.

## 2023-10-07 NOTE — Op Note (Signed)
 [facility] Patient Name: Shelby Randolph Procedure Date: 10/07/2023 7:57 AM MRN: [id number] Endoscopist: Kenney Peacemaker , MD, [id number] Age: 73 Referring MD:  Date of Birth: [birth year] Gender: Female Account #: [id number] Procedure:                Upper GI endoscopy Indications:              Follow-up of polyps in the duodenum (7 mm adenoma                            removed 09/2022), Hereditary nonpolyposis colorectal                            cancer (Lynch Syndrome) Medicines:                Monitored Anesthesia Care Procedure:                Pre-Anesthesia Assessment:                           - Prior to the procedure, a History and Physical                            was performed, and patient medications and                            allergies were reviewed. The patient's tolerance of                            previous anesthesia was also reviewed. The risks                            and benefits of the procedure and the sedation                            options and risks were discussed with the patient.                            All questions were answered, and informed consent                            was obtained. Prior Anticoagulants: The patient has                            taken no anticoagulant or antiplatelet agents. ASA                            Grade Assessment: II - A patient with mild systemic                            disease. After reviewing the risks and benefits,                            the patient was deemed in satisfactory condition to  undergo the procedure.                           After obtaining informed consent, the endoscope was                            passed under direct vision. Throughout the                            procedure, the patient's blood pressure, pulse, and                            oxygen saturations were monitored continuously. The                            GIF [id number] #[id number] was  introduced through the                            mouth, and advanced to the second part of duodenum.                            The upper GI endoscopy was accomplished without                            difficulty. The patient tolerated the procedure                            well. Scope In: Scope Out: Findings:                 The esophagus was normal.                           The stomach was normal.                           The examined duodenum was normal.                           The cardia and gastric fundus were normal on                            retroflexion. Complications:            No immediate complications. Estimated Blood Loss:     Estimated blood loss: none. Impression:               - Normal esophagus.                           - Normal stomach.                           - Normal examined duodenum.                           - No specimens collected. Recommendation:           - Patient has a contact number  available for                            emergencies. The signs and symptoms of potential                            delayed complications were discussed with the                            patient. Return to normal activities tomorrow.                            Written discharge instructions were provided to the                            patient.                           - Resume previous diet.                           - Continue present medications.                           - See the other procedure note for documentation of                            additional recommendations.                           - Repeat upper endoscopy in 2 years. Kenney Peacemaker, MD 10/07/2023 8:40:39 AM This report has been signed electronically.

## 2023-10-07 NOTE — Patient Instructions (Addendum)
 Upper endoscopy was normal. Two small polyps and diverticulosis on the colonoscopy.  Repeat colonoscopy 1 year and upper endoscopy in 2 years.  I appreciate the opportunity to care for you. Kenney Peacemaker, MD, FACG  YOU HAD AN ENDOSCOPIC PROCEDURE TODAY AT THE [facility]:   Refer to the procedure report that was given to you for any specific questions about what was found during the examination.  If the procedure report does not answer your questions, please call your gastroenterologist to clarify.  If you requested that your care partner not be given the details of your procedure findings, then the procedure report has been included in a sealed envelope for you to review at your convenience later.  YOU SHOULD EXPECT: Some feelings of bloating in the abdomen. Passage of more gas than usual.  Walking can help get rid of the air that was put into your GI tract during the procedure and reduce the bloating. If you had a lower endoscopy (such as a colonoscopy or flexible sigmoidoscopy) you may notice spotting of blood in your stool or on the toilet paper. If you underwent a bowel prep for your procedure, you may not have a normal bowel movement for a few days.  Please Note:  You might notice some irritation and congestion in your nose or some drainage.  This is from the oxygen used during your procedure.  There is no need for concern and it should clear up in a day or so.  SYMPTOMS TO REPORT IMMEDIATELY:  Following lower endoscopy (colonoscopy or flexible sigmoidoscopy):  Excessive amounts of blood in the stool  Significant tenderness or worsening of abdominal pains  Swelling of the abdomen that is new, acute  Fever of 100F or higher  Following upper endoscopy (EGD)  Vomiting of blood or coffee ground material  New chest pain or pain under the shoulder blades  Painful or persistently difficult swallowing  New shortness of breath  Fever of 100F or higher  Black, tarry-looking  stools  For urgent or emergent issues, a gastroenterologist can be reached at any hour by calling [phone]. Do not use MyChart messaging for urgent concerns.    DIET:  We do recommend a small meal at first, but then you may proceed to your regular diet.  Drink plenty of fluids but you should avoid alcoholic beverages for 24 hours.  ACTIVITY:  You should plan to take it easy for the rest of today and you should NOT DRIVE or use heavy machinery until tomorrow (because of the sedation medicines used during the test).    FOLLOW UP: Our staff will call the number listed on your records the next business day following your procedure.  We will call around 7:15- 8:00 am to check on you and address any questions or concerns that you may have regarding the information given to you following your procedure. If we do not reach you, we will leave a message.     If any biopsies were taken you will be contacted by phone or by letter within the next 1-3 weeks.  Please call us  at [phone] if you have not heard about the biopsies in 3 weeks.    SIGNATURES/CONFIDENTIALITY: You and/or your care partner have signed paperwork which will be entered into your electronic medical record.  These signatures attest to the fact that that the information above on your After Visit Summary has been reviewed and is understood.  Full responsibility of the confidentiality of this discharge  information lies with you and/or your care-partner.

## 2023-10-07 NOTE — Op Note (Signed)
 [facility] Patient Name: Shelby Randolph Procedure Date: 10/07/2023 7:55 AM MRN: [id number] Endoscopist: Kenney Peacemaker , MD, [id number] Age: 73 Referring MD:  Date of Birth: 30-Dec-1950 Gender: Female Account #: [id number] Procedure:                Colonoscopy Indications:              High risk colon cancer surveillance: Personal                            history of hereditary nonpolyposis colorectal                            cancer (Lynch Syndrome) + personal hx polyps Medicines:                Monitored Anesthesia Care Procedure:                Pre-Anesthesia Assessment:                           - Prior to the procedure, a History and Physical                            was performed, and patient medications and                            allergies were reviewed. The patient's tolerance of                            previous anesthesia was also reviewed. The risks                            and benefits of the procedure and the sedation                            options and risks were discussed with the patient.                            All questions were answered, and informed consent                            was obtained. Prior Anticoagulants: The patient has                            taken no anticoagulant or antiplatelet agents. ASA                            Grade Assessment: II - A patient with mild systemic                            disease. After reviewing the risks and benefits,                            the patient was deemed in satisfactory condition to  undergo the procedure.                           After obtaining informed consent, the colonoscope                            was passed under direct vision. Throughout the                            procedure, the patient's blood pressure, pulse, and                            oxygen saturations were monitored continuously. The                            Olympus Scope  [id number] was introduced through the                            anus and advanced to the the cecum, identified by                            appendiceal orifice and ileocecal valve. The                            colonoscopy was performed without difficulty. The                            patient tolerated the procedure well. The quality                            of the bowel preparation was adequate. The bowel                            preparation used was Miralax via split dose                            instruction. The ileocecal valve, appendiceal                            orifice, and rectum were photographed. Scope In: 8:13:42 AM Scope Out: 8:34:19 AM Scope Withdrawal Time: 0 hours 17 minutes 8 seconds  Total Procedure Duration: 0 hours 20 minutes 37 seconds  Findings:                 The digital rectal exam findings include decreased                            sphincter tone.                           Two sessile polyps were found in the proximal                            descending colon. The polyps were diminutive in  size. These polyps were removed with a cold snare.                            Resection and retrieval were complete. Verification                            of patient identification for the specimen was                            done. Estimated blood loss was minimal.                           Multiple diverticula were found in the entire colon.                           The exam was otherwise without abnormality on                            direct and retroflexion views. Complications:            No immediate complications. Estimated Blood Loss:     Estimated blood loss was minimal. Impression:               - Decreased sphincter tone found on digital rectal                            exam.                           - Two diminutive polyps in the proximal descending                            colon, removed with a cold snare.  Resected and                            retrieved.                           - Diverticulosis in the entire examined colon.                           - The examination was otherwise normal on direct                            and retroflexion views.                           - Personal history of colonic polyps and Lynch                            Syndrome. Recommendation:           - Patient has a contact number available for                            emergencies. The signs and symptoms of potential  delayed complications were discussed with the                            patient. Return to normal activities tomorrow.                            Written discharge instructions were provided to the                            patient.                           - Resume previous diet.                           - Continue present medications.                           - Await pathology results.                           - Repeat colonoscopy in 1 year. Kenney Peacemaker, MD 10/07/2023 8:45:06 AM This report has been signed electronically.

## 2023-10-07 NOTE — Progress Notes (Signed)
 Sedate, gd SR, tolerated procedure well, VSS, report to RN

## 2023-10-08 ENCOUNTER — Telehealth: Payer: Self-pay | Admitting: *Deleted

## 2023-10-08 ENCOUNTER — Other Ambulatory Visit: Payer: Self-pay | Admitting: Internal Medicine

## 2023-10-08 MED ORDER — ALOSETRON HCL 1 MG PO TABS: 1.0000 mg | ORAL_TABLET | Freq: Two times a day (BID) | ORAL | 3 refills | Status: AC

## 2023-10-08 NOTE — Telephone Encounter (Signed)
  Follow up Call-     10/07/2023    7:11 AM 09/24/2022    7:48 AM 09/24/2022    7:38 AM 09/05/2021   10:32 AM  Call back number  Post procedure Call Back phone  # [phone]  [phone]  Permission to leave phone message Yes  Yes Yes     Patient questions:  Do you have a fever, pain , or abdominal swelling? No. Pain Score  0 *  Have you tolerated food without any problems? Yes.    Have you been able to return to your normal activities? Yes.    Do you have any questions about your discharge instructions: Diet   No. Medications  No. Follow up visit  No.  Do you have questions or concerns about your Care? No.  Actions: * If pain score is 4 or above: No action needed, pain <4.

## 2023-10-11 LAB — SURGICAL PATHOLOGY

## 2023-10-13 ENCOUNTER — Ambulatory Visit (HOSPITAL_COMMUNITY)
Admission: EM | Admit: 2023-10-13 | Discharge: 2023-10-13 | Disposition: A | Discharge status: Home / Self Care | Attending: Emergency Medicine | Admitting: Emergency Medicine

## 2023-10-13 ENCOUNTER — Encounter (HOSPITAL_COMMUNITY): Payer: Self-pay | Admitting: Emergency Medicine

## 2023-10-13 DIAGNOSIS — M546 Pain in thoracic spine: Secondary | ICD-10-CM | POA: Diagnosis not present

## 2023-10-13 LAB — POCT URINALYSIS DIP (MANUAL ENTRY)
Blood, UA: NEGATIVE
Glucose, UA: NEGATIVE mg/dL
Ketones, POC UA: NEGATIVE mg/dL
Nitrite, UA: NEGATIVE
Protein Ur, POC: 30 mg/dL — AB
Spec Grav, UA: >=1.03 — AB
Urobilinogen, UA: 0.2 U/dL
pH, UA: 5.5

## 2023-10-13 MED ORDER — DEXAMETHASONE SODIUM PHOSPHATE 10 MG/ML IJ SOLN
10.0000 mg | Freq: Once | INTRAMUSCULAR | Status: AC
  Administered 2023-10-13: 10 mg via INTRAMUSCULAR

## 2023-10-13 MED ORDER — METHOCARBAMOL 500 MG PO TABS: 250.0000 mg | ORAL_TABLET | Freq: Two times a day (BID) | ORAL | 0 refills | Status: AC

## 2023-10-13 MED ORDER — DEXAMETHASONE SODIUM PHOSPHATE 10 MG/ML IJ SOLN
INTRAMUSCULAR | Status: AC
  Filled 2023-10-13: qty 1

## 2023-10-13 NOTE — Discharge Instructions (Addendum)
 You appear to be having a muscle spasm of your low back.  We have given you a steroid shot to help with the inflammation.  You can use the muscle relaxer up to 2 times daily.  Do not drink alcohol or drive on this medication as it will cause drowsiness and sedation.  Do not hesitate to seek immediate care at the nearest emergency department if you develop a ripping or tearing sensation, hematuria, inability to urinate, or worsening back pain.

## 2023-10-13 NOTE — ED Triage Notes (Signed)
 Pt reports when got up today day after laying down had mid back pain and nausea. Took tylenol  around 10am and zofran  this afternoon.

## 2023-10-13 NOTE — ED Provider Notes (Signed)
 [facility]    CSN: [id number] Arrival date & time: 10/13/23  1639      History   Chief Complaint Chief Complaint  Patient presents with   Back Pain    HPI Shelby Randolph is a 73 y.o. female.   Patient presents to clinic over concerns of severe right sided thoracic back pain.  She did go this morning to the dermatologist and laid flat on this bed.  After she laid down earlier today when she got up she had severe thoracic back pain.  Unable to sit or lay down due to pain. Pain with back extension. Has not had a ripping or tearing sensation.  The pain is so severe she is nauseated.  Has not had any trauma or falls.   Has had a kidney stone once in the past.  Has not had any hematuria, urgency or frequency.  Did take Tylenol  this morning and Zofran  this afternoon.  Reports her blood pressure is elevated for her.  The history is provided by the patient and medical records.  Back Pain   Past Medical History:  Diagnosis Date   Abnormal screening cardiac CT 11/2011   coronary calcium  score 13, 24% percentile, mild plaque in prox LAD and ostial RCA   Adenomatous colon polyp    Dr. Loy Ruff   Allergy    Anemia    Anxiety    Arthritis    Asthma    Atherosclerosis of aorta (HCC) 03/2014   abnormal on ultrasound, repeat US  within 5 years   Cancer [facility])    Skin cancer squamous   Depression    Diverticulosis    Eye injury    in college, some pertinent loss of vision in left eye   Genital herpes    GERD (gastroesophageal reflux disease)    H/O echocardiogram 10/28/2018   LVEF 45-50%, impaired diastolic dysfunction, mild mitral and tricuspid regurgitation; Dr. Filiberto Hug   History of bone density study 09/2011   normal   History of mammogram    Dr. Cloretta Danes   History of melanoma    sees Benita Bramble, dermatology   History of PFTs 01/20/2011   moderate airflow limitation, no significant response to bronchodilator   Hyperlipidemia    Hypertension 12/2011    Insomnia    Migraines    Dr. Lamar Pillar, migraines- PAST HX   MSH6-related Lynch syndrome (HNPCC5) 08/20/2017   Nearsightedness    wears glasses   Osteopenia 2020   Post-operative nausea and vomiting    Routine gynecological examination    Dr. Robertha China virus disease 06/2014    Patient Active Problem List   Diagnosis Date Noted   Impaired fasting blood sugar 07/15/2023   Immunization reaction 01/21/2021   Recurrent cold sores 07/10/2020   Screening for diabetes mellitus 07/10/2020   Encounter for screening for vascular disease 07/10/2020   Advanced directives, counseling/discussion 07/10/2020   Actinic keratoses 07/10/2020   Contracture of right hand 07/10/2020   Chronic pain of left knee 07/10/2019   Decreased rectal sphincter tone 07/10/2019   Post-menopausal 07/07/2018   Estrogen deficiency 07/07/2018   Vaccine counseling 07/07/2018   Need for pneumococcal vaccination 07/07/2018   Osteopenia 07/07/2018   Skin lesion 07/07/2018   Varicose veins of both lower extremities 01/24/2018   Monoallelic mutation of MSH6 gene 09/14/2017   Lynch syndrome 08/20/2017   Toenail deformity 07/06/2017   Medicare annual wellness visit, subsequent 06/25/2016   Encounter for health maintenance examination in adult 05/23/2015  Generalized anxiety disorder 05/23/2015   Vitamin D  deficiency 05/23/2015   IBS (irritable bowel syndrome) 06/11/2014   Gallstones 04/12/2014   Dyslipidemia 04/12/2014   Essential hypertension 04/12/2014   Asthma, mild intermittent 04/12/2014   Insomnia 04/12/2014   Genital herpes 04/12/2014   Chronic nausea 04/12/2014   History of colonic polyps 04/12/2014   Rhinitis, allergic 04/12/2014   Atherosclerosis of abdominal aorta (HCC) 04/12/2014    Past Surgical History:  Procedure Laterality Date   COLONOSCOPY  2016   Dr. Loy Ruff, 2014 Dr. Andriette Keeling   COLONOSCOPY  01/2019   weak anal sphincter tone, moderate rectocele, polyp, scattered diverticula; Dr.  Loy Ruff   COLONOSCOPY  2023   CG-miralax(prep adeq)   ELBOW SURGERY Left 03/2022   ESOPHAGOGASTRODUODENOSCOPY  11/2017   normal, Dr. Loy Ruff   POLYPECTOMY     ROTATOR CUFF REPAIR Right 05/2010   TONSILLECTOMY AND ADENOIDECTOMY     as a kid   TUBAL LIGATION  1985   VAGINAL HYSTERECTOMY  1998   total; abnormal peroids   VARICOSE VEIN SURGERY     bilat    OB History   No obstetric history on file.      Home Medications    Prior to Admission medications   Medication Sig Start Date End Date Taking? Authorizing Provider  methocarbamol (ROBAXIN) 500 MG tablet Take 0.5 tablets (250 mg total) by mouth 2 (two) times daily. 10/13/23  Yes Nakeyia Menden  N, FNP  acetaminophen  (TYLENOL ) 500 MG tablet 1-2 tablets twice to three times daily as needed. 02/03/12   Tysinger, Christiane Cowing, PA-C  albuterol  (VENTOLIN  HFA) 108 (90 Base) MCG/ACT inhaler Inhale 2 puffs into the lungs every 6 (six) hours as needed for wheezing or shortness of breath. 12/06/21   [provider]  alendronate (FOSAMAX) 70 MG/75ML solution Take 70 mg by mouth every 7 (seven) days. Take with a full glass of water on an empty stomach.    [provider]  alosetron  (LOTRONEX ) 1 MG tablet Take 1 tablet (1 mg total) by mouth 2 (two) times daily. 10/08/23   Kenney Peacemaker, MD  aspirin  EC 81 MG tablet Take 1 tablet (81 mg total) by mouth daily. 07/16/23   Tysinger, Christiane Cowing, PA-C  atorvastatin  (LIPITOR) 40 MG tablet Take 1 tablet (40 mg total) by mouth daily. 07/16/23 07/15/24  Tysinger, Christiane Cowing, PA-C  benzonatate  (TESSALON ) 200 MG capsule Take 1 capsule (200 mg total) by mouth 3 (three) times daily as needed for cough. Patient not taking: Reported on 10/07/2023 06/16/23   Tysinger, Christiane Cowing, PA-C  cetirizine  (ZYRTEC ) 10 MG tablet Take 10 mg by mouth daily.    [provider]  cholecalciferol (VITAMIN D3) 25 MCG (1000 UNIT) tablet Take 1 tablet (1,000 Units total) by mouth daily. 07/11/20   Tysinger, Christiane Cowing, PA-C   fluorouracil (EFUDEX) 5 % cream 1 Application. 02/11/21   [provider]  Multiple Vitamin (MULTIVITAMIN ADULT PO) Take 1 tablet by mouth daily.    [provider]  olmesartan  (BENICAR ) 40 MG tablet Take 1 tablet (40 mg total) by mouth daily. 07/16/23   Tysinger, Christiane Cowing, PA-C  ondansetron  (ZOFRAN ) 4 MG tablet Take 1 tablet (4 mg total) by mouth every 8 (eight) hours as needed for nausea or vomiting. 09/16/23   Kenney Peacemaker, MD  Probiotic Product (PRO-BIOTIC BLEND PO) Take 1 tablet by mouth daily.    [provider]  triamcinolone  cream (KENALOG ) 0.1 % 1 Application. 09/22/18  [provider]  valACYclovir  (VALTREX ) 1000 MG tablet Take 1 tablet (1,000 mg total) by mouth daily. 09/30/23   Tysinger, Christiane Cowing, PA-C  zolpidem  (AMBIEN ) 10 MG tablet TAKE 1/2 TO 1 TABLET AT BEDTIME 09/06/23   Tysinger, Christiane Cowing, PA-C    Family History Family History  Problem Relation Age of Onset   COPD Mother        died age 65yo.  emphysema   Colon polyps Father    Colon cancer Father 32        died age 61yo   Colon polyps Brother    Colon cancer Brother 4   Breast cancer Maternal Aunt    Colon polyps Paternal Uncle    Colon cancer Paternal Uncle        2   Colon polyps Paternal Grandmother    Colon cancer Paternal Grandmother 86       colon cancer    Breast cancer Daughter    Diabetes Neg Hx    Heart disease Neg Hx    Hypertension Neg Hx    Stroke Neg Hx    Rectal cancer Neg Hx    Stomach cancer Neg Hx    Esophageal cancer Neg Hx     Social History Social History   Tobacco Use   Smoking status: Never   Smokeless tobacco: Never  Vaping Use   Vaping status: Never Used  Substance Use Topics   Alcohol use: Not Currently    Comment: occasional   Drug use: No     Allergies   Misc. sulfonamide containing compounds, Sulfonamide derivatives, Dust mite extract, Guaifenesin , Mucinex  [guaifenesin  er], and Fluoxetine    Review of Systems Review of  Systems  Per HPI  Physical Exam Triage Vital Signs ED Triage Vitals  Encounter Vitals Group     BP 10/13/23 1653 (!) 150/82     Systolic BP Percentile --      Diastolic BP Percentile --      Pulse Rate 10/13/23 1653 93     Resp 10/13/23 1653 20     Temp 10/13/23 1653 97.7 F (36.5 C)     Temp Source 10/13/23 1653 Oral     SpO2 10/13/23 1653 98 %     Weight --      Height --      Head Circumference --      Peak Flow --      Pain Score 10/13/23 1651 9     Pain Loc --      Pain Education --      Exclude from Growth Chart --    No data found.  Updated Vital Signs BP (!) 150/82 (BP Location: Left Arm)   Pulse 93   Temp 97.7 F (36.5 C) (Oral)   Resp 20   SpO2 98%   Visual Acuity Right Eye Distance:   Left Eye Distance:   Bilateral Distance:    Right Eye Near:   Left Eye Near:    Bilateral Near:     Physical Exam Vitals and nursing note reviewed.  Constitutional:      Appearance: Normal appearance.  HENT:     Head: Normocephalic and atraumatic.     Right Ear: External ear normal.     Left Ear: External ear normal.     Nose: Nose normal.     Mouth/Throat:     Mouth: Mucous membranes are moist.  Eyes:     Conjunctiva/sclera: Conjunctivae normal.  Cardiovascular:     Rate  and Rhythm: Normal rate.  Pulmonary:     Effort: Pulmonary effort is normal. No respiratory distress.  Musculoskeletal:        General: Tenderness present. Normal range of motion.       Back:  Skin:    General: Skin is warm and dry.  Neurological:     General: No focal deficit present.     Mental Status: She is alert.  Psychiatric:        Mood and Affect: Mood normal.        Behavior: Behavior is cooperative.      UC Treatments / Results  Labs (all labs ordered are listed, but only abnormal results are displayed) Labs Reviewed  POCT URINALYSIS DIP (MANUAL ENTRY) - Abnormal; Notable for the following components:      Result Value   Color, UA straw (*)    Clarity, UA hazy (*)     Bilirubin, UA small (*)    Spec Grav, UA >=1.030 (*)    Protein Ur, POC =30 (*)    Leukocytes, UA Trace (*)    All other components within normal limits    EKG   Radiology No results found.  Procedures Procedures (including critical care time)  Medications Ordered in UC Medications  dexamethasone (DECADRON) injection 10 mg (10 mg Intramuscular Given 10/13/23 1740)    Initial Impression / Assessment and Plan / UC Course  I have reviewed the triage vital signs and the nursing notes.  Pertinent labs & imaging results that were available during my care of the patient were reviewed by me and considered in my medical decision making (see chart for details).  Vitals in triage reviewed, patient is hemodynamically stable.  Left-sided thoracic back pain that is reproducible with movement and palpation.  History of kidney stones, suspicious due to location.  Denies urinary symptoms.  UA without blood.  Limitations of urgent care with diagnosing or confirming kidney stones was discussed.  Patient is alert and oriented.  Without tachycardia.  Without ripping or tearing sensation, lower concern for AAA rupture.  Will treat with IM steroid and muscle relaxer.  Strict emergency precautions given if symptoms evolve or worsen in any way.  Patient verbalized understanding, no questions at this time.     Final Clinical Impressions(s) / UC Diagnoses   Final diagnoses:  Acute left-sided thoracic back pain     Discharge Instructions      You appear to be having a muscle spasm of your low back.  We have given you a steroid shot to help with the inflammation.  You can use the muscle relaxer up to 2 times daily.  Do not drink alcohol or drive on this medication as it will cause drowsiness and sedation.  Do not hesitate to seek immediate care at the nearest emergency department if you develop a ripping or tearing sensation, hematuria, inability to urinate, or worsening back pain.   ED  Prescriptions     Medication Sig Dispense Auth. Provider   methocarbamol (ROBAXIN) 500 MG tablet Take 0.5 tablets (250 mg total) by mouth 2 (two) times daily. 15 tablet Harlow Lighter, Kathlynn Swofford  N, FNP      PDMP not reviewed this encounter.   Harlow Lighter, Ledell Codrington  N, FNP 10/13/23 1816

## 2023-10-14 ENCOUNTER — Emergency Department (HOSPITAL_COMMUNITY)

## 2023-10-14 ENCOUNTER — Emergency Department (HOSPITAL_COMMUNITY)
Admission: EM | Admit: 2023-10-14 | Discharge: 2023-10-14 | Disposition: A | Discharge status: Home / Self Care | Attending: Emergency Medicine | Admitting: Emergency Medicine

## 2023-10-14 ENCOUNTER — Encounter (HOSPITAL_COMMUNITY): Payer: Self-pay

## 2023-10-14 ENCOUNTER — Other Ambulatory Visit: Payer: Self-pay

## 2023-10-14 DIAGNOSIS — K8020 Calculus of gallbladder without cholecystitis without obstruction: Secondary | ICD-10-CM | POA: Diagnosis not present

## 2023-10-14 DIAGNOSIS — R079 Chest pain, unspecified: Secondary | ICD-10-CM | POA: Diagnosis not present

## 2023-10-14 DIAGNOSIS — Z85038 Personal history of other malignant neoplasm of large intestine: Secondary | ICD-10-CM | POA: Insufficient documentation

## 2023-10-14 DIAGNOSIS — M549 Dorsalgia, unspecified: Secondary | ICD-10-CM | POA: Diagnosis present

## 2023-10-14 DIAGNOSIS — M5459 Other low back pain: Secondary | ICD-10-CM | POA: Diagnosis not present

## 2023-10-14 DIAGNOSIS — M545 Low back pain, unspecified: Secondary | ICD-10-CM | POA: Insufficient documentation

## 2023-10-14 DIAGNOSIS — R109 Unspecified abdominal pain: Secondary | ICD-10-CM | POA: Diagnosis not present

## 2023-10-14 DIAGNOSIS — K5750 Diverticulosis of both small and large intestine without perforation or abscess without bleeding: Secondary | ICD-10-CM | POA: Diagnosis not present

## 2023-10-14 DIAGNOSIS — Z7982 Long term (current) use of aspirin: Secondary | ICD-10-CM | POA: Diagnosis not present

## 2023-10-14 LAB — URINALYSIS, MICROSCOPIC (REFLEX): Bacteria, UA: NONE SEEN

## 2023-10-14 LAB — CBC WITH DIFFERENTIAL/PLATELET
Abs Immature Granulocytes: 0.02 10*3/uL (ref 0.00–0.07)
Basophils Absolute: 0 10*3/uL (ref 0.0–0.1)
Basophils Relative: 0 %
Eosinophils Absolute: 0 10*3/uL (ref 0.0–0.5)
Eosinophils Relative: 0 %
HCT: 39.1 % (ref 36.0–46.0)
Hemoglobin: 13.5 g/dL (ref 12.0–15.0)
Immature Granulocytes: 1 %
Lymphocytes Relative: 15 %
Lymphs Abs: 0.6 10*3/uL — ABNORMAL LOW (ref 0.7–4.0)
MCH: 31.3 pg (ref 26.0–34.0)
MCHC: 34.5 g/dL (ref 30.0–36.0)
MCV: 90.5 fL (ref 80.0–100.0)
Monocytes Absolute: 0.1 10*3/uL (ref 0.1–1.0)
Monocytes Relative: 2 %
Neutro Abs: 3.4 10*3/uL (ref 1.7–7.7)
Neutrophils Relative %: 82 %
Platelets: 173 10*3/uL (ref 150–400)
RBC: 4.32 MIL/uL (ref 3.87–5.11)
RDW: 13.3 % (ref 11.5–15.5)
WBC: 4.2 10*3/uL (ref 4.0–10.5)
nRBC: 0 % (ref 0.0–0.2)

## 2023-10-14 LAB — URINALYSIS, ROUTINE W REFLEX MICROSCOPIC
Bilirubin Urine: NEGATIVE
Glucose, UA: NEGATIVE mg/dL
Hgb urine dipstick: NEGATIVE
Ketones, ur: NEGATIVE mg/dL
Nitrite: NEGATIVE
Protein, ur: 100 mg/dL — AB
Specific Gravity, Urine: 1.02 (ref 1.005–1.030)
pH: 6 (ref 5.0–8.0)

## 2023-10-14 LAB — COMPREHENSIVE METABOLIC PANEL WITH GFR
ALT: 21 U/L (ref 0–44)
AST: 28 U/L (ref 15–41)
Albumin: 4.3 g/dL (ref 3.5–5.0)
Alkaline Phosphatase: 58 U/L (ref 38–126)
Anion gap: 11 (ref 5–15)
BUN: 25 mg/dL — ABNORMAL HIGH (ref 8–23)
CO2: 23 mmol/L (ref 22–32)
Calcium: 10 mg/dL (ref 8.9–10.3)
Chloride: 104 mmol/L (ref 98–111)
Creatinine, Ser: 0.99 mg/dL (ref 0.44–1.00)
GFR, Estimated: >60 mL/min (ref >60)
Glucose, Bld: 184 mg/dL — ABNORMAL HIGH (ref 70–99)
Potassium: 4.1 mmol/L (ref 3.5–5.1)
Sodium: 138 mmol/L (ref 135–145)
Total Bilirubin: 0.6 mg/dL (ref 0.0–1.2)
Total Protein: 7.3 g/dL (ref 6.5–8.1)

## 2023-10-14 LAB — LIPASE, BLOOD: Lipase: 35 U/L (ref 11–51)

## 2023-10-14 MED ORDER — PREDNISONE 20 MG PO TABS: ORAL_TABLET | ORAL | 0 refills | Status: AC

## 2023-10-14 MED ORDER — IOHEXOL 350 MG/ML SOLN
75.0000 mL | Freq: Once | INTRAVENOUS | Status: AC | PRN
  Administered 2023-10-14: 75 mL via INTRAVENOUS

## 2023-10-14 MED ORDER — ONDANSETRON HCL 4 MG/2ML IJ SOLN
4.0000 mg | Freq: Once | INTRAMUSCULAR | Status: AC
  Administered 2023-10-14: 4 mg via INTRAVENOUS
  Filled 2023-10-14: qty 2

## 2023-10-14 MED ORDER — HYDROMORPHONE HCL 1 MG/ML IJ SOLN
0.5000 mg | Freq: Once | INTRAMUSCULAR | Status: AC
  Administered 2023-10-14: 0.5 mg via INTRAVENOUS
  Filled 2023-10-14: qty 1

## 2023-10-14 MED ORDER — OXYCODONE-ACETAMINOPHEN 5-325 MG PO TABS: 2.0000 | ORAL_TABLET | Freq: Three times a day (TID) | ORAL | 0 refills | Status: AC | PRN

## 2023-10-14 NOTE — ED Triage Notes (Signed)
 Says she mid thoracic back pain since ~10am yesterday.   Went to UC who said they were concerned for an Aneurysm.   Pt says she had a shot of steroids, and Rx for robaxin. Both of which are not improving.   Describes pain as worst pain she has ever felt. Cramping sensation.

## 2023-10-14 NOTE — ED Notes (Signed)
 Patient verbalizes understanding of discharge instructions. Opportunity for questioning and answers were provided. Pt discharged from ED.

## 2023-10-15 ENCOUNTER — Ambulatory Visit: Payer: Self-pay | Admitting: Internal Medicine

## 2023-10-15 NOTE — ED Provider Notes (Signed)
 [place] EMERGENCY DEPARTMENT AT [facility] Provider Note   CSN: [id number] Arrival date & time: 10/14/23  0406     History  Chief Complaint  Patient presents with   Back Pain    Shelby Randolph is a 73 y.o. female.  73 year old female without significant past medical history except for distal history of colon cancer presents ER today secondary to back pain.  Patient states it started earlier today progressively worse throughout the day.  She went urgent care.  She thought she might have a kidney stone or an infection or something but they noted she had high blood pressure told her she may have an aneurysm versus muscle spasm.  They treated for muscle spasms with Robaxin and steroids that he then got worse to come to the ER.  Throughout the night the symptoms did get quite a bit worse.  She also describes some radiation down her left leg.  She states that she does have any weakness in her legs, incontinence, fevers or other associated symptoms.   Back Pain      Home Medications Prior to Admission medications   Medication Sig Start Date End Date Taking? Authorizing Provider  oxyCODONE -acetaminophen  (PERCOCET) 5-325 MG tablet Take 2 tablets by mouth every 8 (eight) hours as needed for severe pain (pain score 7-10). 10/14/23  Yes Raider Valbuena, Reymundo Caulk, MD  predniSONE  (DELTASONE ) 20 MG tablet 3 tabs po daily x 3 days, then 2 tabs x 3 days, then 1.5 tabs x 3 days, then 1 tab x 3 days, then 0.5 tabs x 3 days 10/14/23  Yes Karmel Patricelli, Reymundo Caulk, MD  acetaminophen  (TYLENOL ) 500 MG tablet 1-2 tablets twice to three times daily as needed. 02/03/12   Tysinger, Christiane Cowing, PA-C  albuterol  (VENTOLIN  HFA) 108 (90 Base) MCG/ACT inhaler Inhale 2 puffs into the lungs every 6 (six) hours as needed for wheezing or shortness of breath. 12/06/21   [provider]  alendronate (FOSAMAX) 70 MG/75ML solution Take 70 mg by mouth every 7 (seven) days. Take with a full glass of water on an empty stomach.     [provider]  alosetron  (LOTRONEX ) 1 MG tablet Take 1 tablet (1 mg total) by mouth 2 (two) times daily. 10/08/23   Kenney Peacemaker, MD  aspirin  EC 81 MG tablet Take 1 tablet (81 mg total) by mouth daily. 07/16/23   Tysinger, Christiane Cowing, PA-C  atorvastatin  (LIPITOR) 40 MG tablet Take 1 tablet (40 mg total) by mouth daily. 07/16/23 07/15/24  Tysinger, Christiane Cowing, PA-C  benzonatate  (TESSALON ) 200 MG capsule Take 1 capsule (200 mg total) by mouth 3 (three) times daily as needed for cough. Patient not taking: Reported on 10/07/2023 06/16/23   Claudene Crystal, PA-C  cetirizine  (ZYRTEC ) 10 MG tablet Take 10 mg by mouth daily.    [provider]  cholecalciferol (VITAMIN D3) 25 MCG (1000 UNIT) tablet Take 1 tablet (1,000 Units total) by mouth daily. 07/11/20   Tysinger, Christiane Cowing, PA-C  fluorouracil (EFUDEX) 5 % cream 1 Application. 02/11/21   [provider]  methocarbamol (ROBAXIN) 500 MG tablet Take 0.5 tablets (250 mg total) by mouth 2 (two) times daily. 10/13/23   Harlow Lighter, [place]  N, FNP  Multiple Vitamin (MULTIVITAMIN ADULT PO) Take 1 tablet by mouth daily.    [provider]  olmesartan  (BENICAR ) 40 MG tablet Take 1 tablet (40 mg total) by mouth daily. 07/16/23   Tysinger, Christiane Cowing, PA-C  ondansetron  (ZOFRAN ) 4 MG tablet Take 1 tablet (  4 mg total) by mouth every 8 (eight) hours as needed for nausea or vomiting. 09/16/23   Kenney Peacemaker, MD  Probiotic Product (PRO-BIOTIC BLEND PO) Take 1 tablet by mouth daily.    [provider]  triamcinolone  cream (KENALOG ) 0.1 % 1 Application. 09/22/18   [provider]  valACYclovir  (VALTREX ) 1000 MG tablet Take 1 tablet (1,000 mg total) by mouth daily. 09/30/23   Tysinger, Christiane Cowing, PA-C  zolpidem  (AMBIEN ) 10 MG tablet TAKE 1/2 TO 1 TABLET AT BEDTIME 09/06/23   Tysinger, Christiane Cowing, PA-C      Allergies    Misc. sulfonamide containing compounds, Sulfonamide derivatives, Dust mite extract, Guaifenesin , Mucinex  [guaifenesin  er], and  Fluoxetine     Review of Systems   Review of Systems  Musculoskeletal:  Positive for back pain.    Physical Exam Updated Vital Signs BP (!) 168/98 (BP Location: Right Arm)   Pulse 98   Temp 98.2 F (36.8 C) (Oral)   Resp 18   SpO2 99%  Physical Exam Vitals and nursing note reviewed.  Constitutional:      Appearance: She is well-developed.  HENT:     Head: Normocephalic and atraumatic.  Cardiovascular:     Rate and Rhythm: Normal rate and regular rhythm.  Pulmonary:     Effort: No respiratory distress.     Breath sounds: No stridor.  Abdominal:     General: There is no distension.  Musculoskeletal:     Cervical back: Normal range of motion.  Neurological:     General: No focal deficit present.     Mental Status: She is alert.     Comments: No altered mental status, able to give full seemingly accurate history.  Face is symmetric, EOM's intact, pupils equal and reactive, vision intact, tongue and uvula midline without deviation. Upper and Lower extremity motor 5/5, intact pain perception in distal extremities, 2+ reflexes in biceps, patella and achilles tendons. Able to perform finger to nose normal with both hands. Walks without assistance or evident ataxia.       ED Results / Procedures / Treatments   Labs (all labs ordered are listed, but only abnormal results are displayed) Labs Reviewed  CBC WITH DIFFERENTIAL/PLATELET - Abnormal; Notable for the following components:      Result Value   Lymphs Abs 0.6 (*)    All other components within normal limits  COMPREHENSIVE METABOLIC PANEL WITH GFR - Abnormal; Notable for the following components:   Glucose, Bld 184 (*)    BUN 25 (*)    All other components within normal limits  URINALYSIS, ROUTINE W REFLEX MICROSCOPIC - Abnormal; Notable for the following components:   Protein, ur 100 (*)    Leukocytes,Ua TRACE (*)    All other components within normal limits  LIPASE, BLOOD  URINALYSIS, MICROSCOPIC (REFLEX)     EKG None  Radiology CT Angio Chest/Abd/Pel for Dissection W and/or Wo Contrast Result Date: 10/14/2023 CLINICAL DATA:  Evaluate for acute aortic syndrome. Midthoracic back pain. Concern for aneurysm. EXAM: CT ANGIOGRAPHY CHEST, ABDOMEN AND PELVIS TECHNIQUE: Non-contrast CT of the chest was initially obtained. Multidetector CT imaging through the chest, abdomen and pelvis was performed using the standard protocol during bolus administration of intravenous contrast. Multiplanar reconstructed images and MIPs were obtained and reviewed to evaluate the vascular anatomy. RADIATION DOSE REDUCTION: This exam was performed according to the departmental dose-optimization program which includes automated exposure control, adjustment of the mA and/or kV according to patient size and/or use of  iterative reconstruction technique. CONTRAST:  75mL OMNIPAQUE  IOHEXOL  350 MG/ML SOLN COMPARISON:  CT AP from 04/02/2017. FINDINGS: CTA CHEST FINDINGS Cardiovascular: Preferential opacification of the thoracic aorta. No evidence of thoracic aortic aneurysm or dissection. Normal heart size. No pericardial effusion. Aortic atherosclerotic calcifications. Calcifications noted at the left main coronary artery, image 34/5. Mediastinum/Nodes: No enlarged mediastinal, hilar, or axillary lymph nodes. Thyroid  gland, trachea, and esophagus demonstrate no significant findings. Lungs/Pleura: Central airways are patent. No pleural fluid, interstitial edema or airspace disease. No suspicious lung nodules. Musculoskeletal: No chest wall abnormality. No acute or significant osseous findings. Review of the MIP images confirms the above findings. CTA ABDOMEN AND PELVIS FINDINGS VASCULAR Aorta: Normal caliber aorta without aneurysm, dissection, vasculitis or significant stenosis. Aortic atherosclerotic calcifications noted. Celiac: Patent without evidence of aneurysm, dissection, vasculitis or significant stenosis. SMA: Patent without evidence of  aneurysm, dissection, vasculitis or significant stenosis. Renals: Both renal arteries are patent without evidence of aneurysm, dissection, vasculitis, fibromuscular dysplasia or significant stenosis. IMA: Patent without evidence of aneurysm, dissection, vasculitis or significant stenosis. Inflow: Patent without evidence of aneurysm, dissection, vasculitis or significant stenosis. Veins: Duplication of the inferior vena cava noted. Review of the MIP images confirms the above findings. NON-VASCULAR Hepatobiliary: Small liver cysts are identified. Largest measures 9 mm within the anterior dome of liver. No suspicious liver lesions. Gallstones are identified. Largest measures 9 mm. No gallbladder wall thickening or surrounding inflammation. No biliary ductal dilatation. Pancreas: Unremarkable. No pancreatic ductal dilatation or surrounding inflammatory changes. Spleen: Normal in size without focal abnormality. Adrenals/Urinary Tract: Adrenal glands are unremarkable. Kidneys are normal, without renal calculi, focal lesion, or hydronephrosis. Bladder is unremarkable. Stomach/Bowel: Stomach appears normal. Duodenal diverticulum noted along segment 2. No dilated loops of large or small bowel. The appendix is visualized and appears normal. Colonic diverticulosis without signs of acute diverticulitis. Moderate retained stool noted within the right colon. No bowel wall thickening, inflammation or distension. Lymphatic: No signs of abdominopelvic adenopathy. Reproductive: Status post hysterectomy. No adnexal masses. Other: No free fluid or fluid collection. No signs of free air. Small fat containing right inguinal hernia. Musculoskeletal: No acute or significant osseous findings. Review of the MIP images confirms the above findings. IMPRESSION: 1. No evidence for aortic aneurysm or dissection. 2. No acute findings within the chest, abdomen or pelvis. 3. Cholelithiasis. 4. Colonic diverticulosis without signs of acute  diverticulitis. 5. Coronary artery calcification. 6.  Aortic Atherosclerosis (ICD10-I70.0). Electronically Signed   By: Kimberley Penman M.D.   On: 10/14/2023 06:05    Procedures Procedures    Medications Ordered in ED Medications  HYDROmorphone (DILAUDID) injection 0.5 mg (0.5 mg Intravenous Given 10/14/23 0520)  ondansetron  (ZOFRAN ) injection 4 mg (4 mg Intravenous Given 10/14/23 0519)  iohexol  (OMNIPAQUE ) 350 MG/ML injection 75 mL (75 mLs Intravenous Contrast Given 10/14/23 0542)    ED Course/ Medical Decision Making/ A&P                                 Medical Decision Making Amount and/or Complexity of Data Reviewed Labs: ordered. Radiology: ordered.  Risk Prescription drug management.   No e/o acute etiology to include dissection or aneurysm on ct scan. Labs reassuring. H/o colon ca so had shared decision making discussion about possible MRI to rule out metastatic disease but think it's unlikely esp with normal ct scan so will hold off a this time. Will treat for sicatica and have pcp follow  up.   Final Clinical Impression(s) / ED Diagnoses Final diagnoses:  Acute left-sided low back pain, unspecified whether sciatica present    Rx / DC Orders ED Discharge Orders          Ordered    predniSONE  (DELTASONE ) 20 MG tablet        10/14/23 0712    oxyCODONE -acetaminophen  (PERCOCET) 5-325 MG tablet  Every 8 hours PRN        10/14/23 0712              Carmelo Reidel, MD 10/15/23 [phone]

## 2023-11-01 ENCOUNTER — Inpatient Hospital Stay: Admitting: Medical

## 2023-11-09 DIAGNOSIS — L820 Inflamed seborrheic keratosis: Secondary | ICD-10-CM | POA: Diagnosis not present

## 2023-11-09 DIAGNOSIS — L821 Other seborrheic keratosis: Secondary | ICD-10-CM | POA: Diagnosis not present

## 2023-11-09 DIAGNOSIS — L570 Actinic keratosis: Secondary | ICD-10-CM | POA: Diagnosis not present

## 2023-11-10 DIAGNOSIS — M25562 Pain in left knee: Secondary | ICD-10-CM | POA: Diagnosis not present

## 2023-11-25 ENCOUNTER — Other Ambulatory Visit: Payer: Self-pay | Admitting: Medical

## 2023-11-25 NOTE — Telephone Encounter (Signed)
 Last apt. 07/15/23

## 2023-11-26 ENCOUNTER — Other Ambulatory Visit: Payer: Self-pay | Admitting: Medical

## 2023-11-26 MED ORDER — CIPROFLOXACIN HCL 500 MG PO TABS: 500.0000 mg | ORAL_TABLET | Freq: Two times a day (BID) | ORAL | 0 refills | Status: AC

## 2023-11-26 MED ORDER — ZOLPIDEM TARTRATE 5 MG PO TABS
5.0000 mg | ORAL_TABLET | Freq: Every evening | ORAL | 0 refills | Status: DC | PRN
Start: 2023-11-26 — End: 2024-01-05

## 2024-01-05 ENCOUNTER — Other Ambulatory Visit: Payer: Self-pay | Admitting: Medical

## 2024-01-05 ENCOUNTER — Telehealth: Payer: Self-pay | Admitting: Internal Medicine

## 2024-01-05 MED ORDER — ZOLPIDEM TARTRATE 10 MG PO TABS: 10.0000 mg | ORAL_TABLET | Freq: Every evening | ORAL | 0 refills | Status: DC | PRN

## 2024-01-05 NOTE — Telephone Encounter (Signed)
 Copied from CRM [id number]. Topic: General - Other >> Jan 05, 2024  1:33 PM Santiya F wrote: Reason for CRM: Patient is calling in because [facility] said it was time for her pneumonia vaccine and she wanted to know was it safe for her to the flu shot at the same time. Please advise.

## 2024-01-05 NOTE — Telephone Encounter (Signed)
 Pt was notified.

## 2024-01-18 ENCOUNTER — Telehealth: Payer: Self-pay | Admitting: Internal Medicine

## 2024-01-18 NOTE — Telephone Encounter (Signed)
 Wrote script and placed up front for pt. Left message for pt that script was ready  Copied from CRM [id number]. Topic: Clinical - Medication Question >> Jan 18, 2024  1:03 PM Shelby Randolph wrote: Reason for CRM: patient called to get a script for the Covid vaccination. Please f/u with patient when its ready for pick up

## 2024-03-14 DIAGNOSIS — L821 Other seborrheic keratosis: Secondary | ICD-10-CM | POA: Diagnosis not present

## 2024-03-14 DIAGNOSIS — L820 Inflamed seborrheic keratosis: Secondary | ICD-10-CM | POA: Diagnosis not present

## 2024-03-14 DIAGNOSIS — Z85828 Personal history of other malignant neoplasm of skin: Secondary | ICD-10-CM | POA: Diagnosis not present

## 2024-03-14 DIAGNOSIS — D225 Melanocytic nevi of trunk: Secondary | ICD-10-CM | POA: Diagnosis not present

## 2024-03-14 DIAGNOSIS — D485 Neoplasm of uncertain behavior of skin: Secondary | ICD-10-CM | POA: Diagnosis not present

## 2024-03-14 DIAGNOSIS — L570 Actinic keratosis: Secondary | ICD-10-CM | POA: Diagnosis not present

## 2024-03-14 DIAGNOSIS — L578 Other skin changes due to chronic exposure to nonionizing radiation: Secondary | ICD-10-CM | POA: Diagnosis not present

## 2024-03-14 DIAGNOSIS — D0439 Carcinoma in situ of skin of other parts of face: Secondary | ICD-10-CM | POA: Diagnosis not present

## 2024-03-14 DIAGNOSIS — L814 Other melanin hyperpigmentation: Secondary | ICD-10-CM | POA: Diagnosis not present

## 2024-04-02 ENCOUNTER — Other Ambulatory Visit: Payer: Self-pay | Admitting: Medical

## 2024-04-12 DIAGNOSIS — C44329 Squamous cell carcinoma of skin of other parts of face: Secondary | ICD-10-CM | POA: Diagnosis not present

## 2024-04-12 HISTORY — PX: MOHS SURGERY: SHX181

## 2024-04-21 ENCOUNTER — Ambulatory Visit: Payer: Self-pay

## 2024-04-21 VITALS — Ht 67.0 in | Wt 165.0 lb

## 2024-04-21 DIAGNOSIS — Z0000 Encounter for general adult medical examination without abnormal findings: Secondary | ICD-10-CM

## 2024-04-21 NOTE — Progress Notes (Signed)
 Chief Complaint  Patient presents with   Medicare Wellness     Subjective:   Shelby Randolph is a 73 y.o. female who presents for a Medicare Annual Wellness Visit.  Visit info / Clinical Intake: Medicare Wellness Visit Type:: Subsequent Annual Wellness Visit Persons participating in visit and providing information:: patient Medicare Wellness Visit Mode:: Video Since this visit was completed virtually, some vitals may be partially provided or unavailable. Missing vitals are due to the limitations of the virtual format.: Documented vitals are patient reported If Telephone or Video please confirm:: I connected with patient using audio/video enable telemedicine. I verified patient identity with two identifiers, discussed telehealth limitations, and patient agreed to proceed. Patient Location:: home Provider Location:: office Interpreter Needed?: No Pre-visit prep was completed: yes AWV questionnaire completed by patient prior to visit?: yes Date:: 04/12/24 Living arrangements:: (!) (Patient-Rptd) lives alone Patient's Overall Health Status Rating: (Patient-Rptd) good Typical amount of pain: (Patient-Rptd) some Does pain affect daily life?: (Patient-Rptd) no Are you currently prescribed opioids?: no  Dietary Habits and Nutritional Risks How many meals a day?: (Patient-Rptd) 2 Eats fruit and vegetables daily?: (Patient-Rptd) yes Most meals are obtained by: (Patient-Rptd) preparing own meals In the last 2 weeks, have you had any of the following?: none Diabetic:: no  Functional Status Activities of Daily Living (to include ambulation/medication): (Patient-Rptd) Independent Ambulation: (Patient-Rptd) Independent Medication Administration: (Patient-Rptd) Independent Home Management (perform basic housework or laundry): (Patient-Rptd) Independent Manage your own finances?: (Patient-Rptd) yes Primary transportation is: (Patient-Rptd) driving Concerns about vision?: (!) yes Concerns  about hearing?: no  Fall Screening Falls in the past year?: (Patient-Rptd) 0 Number of falls in past year: 0 Was there an injury with Fall?: 0 Fall Risk Category Calculator: 0 Patient Fall Risk Level: Low Fall Risk  Fall Risk Patient at Risk for Falls Due to: Medication side effect Fall risk Follow up: Falls evaluation completed; Falls prevention discussed  Home and Transportation Safety: All rugs have non-skid backing?: (Patient-Rptd) yes All stairs or steps have railings?: (Patient-Rptd) N/A, no stairs Grab bars in the bathtub or shower?: (!) (Patient-Rptd) no Have non-skid surface in bathtub or shower?: (Patient-Rptd) yes Good home lighting?: (Patient-Rptd) yes Regular seat belt use?: (Patient-Rptd) yes Hospital stays in the last year:: (Patient-Rptd) no  Cognitive Assessment Difficulty concentrating, remembering, or making decisions? : (Patient-Rptd) no Will 6CIT or Mini Cog be Completed: yes What year is it?: 0 points What month is it?: 0 points Give patient an address phrase to remember (5 components): [address] About what time is it?: 0 points Count backwards from 20 to 1: 0 points Say the months of the year in reverse: 0 points Repeat the address phrase from earlier: 0 points 6 CIT Score: 0 points  Advance Directives (For Healthcare) Does Patient Have a Medical Advance Directive?: Yes Does patient want to make changes to medical advance directive?: No - Patient declined Type of Advance Directive: Healthcare Power of [place]; Living will Copy of Healthcare Power of Attorney in Chart?: Yes - validated most recent copy scanned in chart (See row information) Copy of Living Will in Chart?: Yes - validated most recent copy scanned in chart (See row information) Would patient like information on creating a medical advance directive?: No - Patient declined  Reviewed/Updated  Reviewed/Updated: Reviewed All (Medical, Surgical, Family, Medications, Allergies, Care  Teams, Patient Goals)    Allergies (verified) Misc. sulfonamide containing compounds, Sulfonamide derivatives, Dust mite extract, Guaifenesin , Mucinex  [guaifenesin  er], and Fluoxetine   Current Medications (verified) Outpatient Encounter Medications as of 04/21/2024  Medication Sig   acetaminophen  (TYLENOL ) 500 MG tablet 1-2 tablets twice to three times daily as needed.   albuterol  (VENTOLIN  HFA) 108 (90 Base) MCG/ACT inhaler Inhale 2 puffs into the lungs every 6 (six) hours as needed for wheezing or shortness of breath.   alendronate (FOSAMAX) 70 MG/75ML solution Take 70 mg by mouth every 7 (seven) days. Take with a full glass of water on an empty stomach.   alosetron  (LOTRONEX ) 1 MG tablet Take 1 tablet (1 mg total) by mouth 2 (two) times daily.   atorvastatin  (LIPITOR) 40 MG tablet Take 1 tablet (40 mg total) by mouth daily.   cetirizine  (ZYRTEC ) 10 MG tablet Take 10 mg by mouth daily.   cholecalciferol (VITAMIN D3) 25 MCG (1000 UNIT) tablet Take 1 tablet (1,000 Units total) by mouth daily.   methocarbamol  (ROBAXIN ) 500 MG tablet Take 0.5 tablets (250 mg total) by mouth 2 (two) times daily.   Multiple Vitamin (MULTIVITAMIN ADULT PO) Take 1 tablet by mouth daily.   olmesartan  (BENICAR ) 40 MG tablet Take 1 tablet (40 mg total) by mouth daily.   ondansetron  (ZOFRAN ) 4 MG tablet Take 1 tablet (4 mg total) by mouth every 8 (eight) hours as needed for nausea or vomiting.   Probiotic Product (PRO-BIOTIC BLEND PO) Take 1 tablet by mouth daily.   valACYclovir  (VALTREX ) 1000 MG tablet Take 1 tablet (1,000 mg total) by mouth daily.   zolpidem  (AMBIEN ) 10 MG tablet TAKE ONE TABLET AT BEDTIME AS NEEDED FOR SLEEP   aspirin  EC 81 MG tablet Take 1 tablet (81 mg total) by mouth daily.   benzonatate  (TESSALON ) 200 MG capsule Take 1 capsule (200 mg total) by mouth 3 (three) times daily as needed for cough. (Patient not taking: Reported on 10/07/2023)   fluorouracil (EFUDEX) 5 % cream 1 Application.    oxyCODONE -acetaminophen  (PERCOCET) 5-325 MG tablet Take 2 tablets by mouth every 8 (eight) hours as needed for severe pain (pain score 7-10).   predniSONE  (DELTASONE ) 20 MG tablet 3 tabs po daily x 3 days, then 2 tabs x 3 days, then 1.5 tabs x 3 days, then 1 tab x 3 days, then 0.5 tabs x 3 days   triamcinolone  cream (KENALOG ) 0.1 % 1 Application.   No facility-administered encounter medications on file as of 04/21/2024.    History: Past Medical History:  Diagnosis Date   Abnormal screening cardiac CT 11/2011   coronary calcium  score 13, 24% percentile, mild plaque in prox LAD and ostial RCA   Adenomatous colon polyp    Dr. Lupita Commander   Allergy    Anemia    Anxiety    Arthritis    Asthma    Atherosclerosis of aorta 03/2014   abnormal on ultrasound, repeat US  within 5 years   Cancer [facility])    Skin cancer squamous   Depression    Diverticulosis    Eye injury    in college, some pertinent loss of vision in left eye   Genital herpes    GERD (gastroesophageal reflux disease)    H/O echocardiogram 10/28/2018   LVEF 45-50%, impaired diastolic dysfunction, mild mitral and tricuspid regurgitation; Dr. Elmira   History of bone density study 09/2011   normal   History of mammogram    Dr. Leva   History of melanoma    sees Lorrayne Ode, dermatology   History of PFTs 01/20/2011   moderate airflow limitation, no significant response to bronchodilator  Hyperlipidemia    Hypertension 12/2011   Insomnia    Migraines    Dr. Lindy, migraines- PAST HX   MSH6-related Lynch syndrome (HNPCC5) 08/20/2017   Nearsightedness    wears glasses   Osteopenia 2020   Post-operative nausea and vomiting    Routine gynecological examination    Dr. Leva Roberts virus disease 06/2014   Past Surgical History:  Procedure Laterality Date   COLONOSCOPY  2016   Dr. Lupita Commander, 2014 Dr. Luis   COLONOSCOPY  01/2019   weak anal sphincter tone, moderate rectocele, polyp, scattered  diverticula; Dr. Lupita Commander   COLONOSCOPY  2023   CG-miralax(prep adeq)   ELBOW SURGERY Left 03/2022   ESOPHAGOGASTRODUODENOSCOPY  11/2017   normal, Dr. Lupita Commander   MOHS SURGERY  04/12/2024   right forehead   POLYPECTOMY     ROTATOR CUFF REPAIR Right 05/2010   TONSILLECTOMY AND ADENOIDECTOMY     as a kid   TUBAL LIGATION  1985   VAGINAL HYSTERECTOMY  1998   total; abnormal peroids   VARICOSE VEIN SURGERY     bilat   Family History  Problem Relation Age of Onset   COPD Mother        died age 52yo.  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[employer])    Employer: [employer]    Comment: Nurse Practitioner  Tobacco Use   Smoking status: Never   Smokeless tobacco: Never  Vaping Use   Vaping status: Never Used  Substance and Sexual Activity   Alcohol use: Not Currently    Comment: occasional   Drug use: No   Sexual activity: Not on file    Comment: exercise - walks[place], cardio at gym 3 x/wk, yoga, widowed    Tobacco Counseling Counseling given: Not Answered  SDOH Screenings   Food Insecurity: No Food Insecurity (04/17/2024)  Housing: Low Risk (04/17/2024)  Transportation Needs: No Transportation Needs (04/17/2024)  Utilities: Not At Risk (04/21/2024)  Alcohol Screen: Low Risk (04/17/2024)  Depression (PHQ2-9): Medium Risk (04/21/2024)  Financial Resource Strain: Low Risk (04/17/2024)  Physical  Activity: Sufficiently Active (04/17/2024)  Social Connections: Moderately Integrated (04/17/2024)  Stress: Stress Concern Present (04/17/2024)  Tobacco Use: Low Risk (04/21/2024)  Health Literacy: Adequate Health Literacy (04/21/2024)   See flowsheets for full screening details  Depression Screen PHQ 2 & 9 Depression Scale- Over the past 2 weeks, how often have you been bothered by any of the following problems? Little interest or pleasure in doing things: 0 Feeling down, depressed, or hopeless (PHQ Adolescent also includes...irritable): 0 PHQ-2 Total Score: 0 Trouble falling or staying  asleep, or sleeping too much: 3 (ambien ) Feeling tired or having little energy: 2 Poor appetite or overeating (PHQ Adolescent also includes...weight loss): 0 Feeling bad about yourself - or that you are a failure or have let yourself or your family down: 0 Trouble concentrating on things, such as reading the newspaper or watching television (PHQ Adolescent also includes...like school work): 0 Moving or speaking so slowly that other people could have noticed. Or the opposite - being so fidgety or restless that you have been moving around a lot more than usual: 0 Thoughts that you would be better off dead, or of hurting yourself in some way: 0 PHQ-9 Total Score: 5 If you checked off any problems, how difficult have these problems made it for you to do your work, take care of things at home, or get along with other people?: Not difficult at all  Depression Treatment Depression Interventions/Treatment : Currently on Treatment     Goals Addressed             This Visit's Progress    Patient Stated       04/21/2024, wants to lose weight             Objective:    Today's Vitals   04/21/24 1340  Weight: 165 lb (74.8 kg)  Height: 5' 7 (1.702 m)   Body mass index is 25.84 kg/m.  Hearing/Vision screen Hearing Screening - Comments:: Denies hearing issues Vision Screening - Comments:: Regular  eye exams, Dr. Marcey Immunizations and Health Maintenance Health Maintenance  Topic Date Due   Colonoscopy  10/06/2024   Medicare Annual Wellness (AWV)  04/21/2025   Mammogram  07/07/2025   DTaP/Tdap/Td (4 - Td or Tdap) 05/12/2027   Pneumococcal Vaccine: 50+ Years  Completed   Influenza Vaccine  Completed   Bone Density Scan  Completed   Hepatitis C Screening  Completed   Zoster Vaccines- Shingrix  Completed   Meningococcal B Vaccine  Aged Out   Hepatitis B Vaccines 19-59 Average Risk  Discontinued   COVID-19 Vaccine  Discontinued        Assessment/Plan:  This is a routine wellness examination for Makari.  Patient Care Team: Tysinger, Alm RAMAN, PA-C as PCP - General (Family Medicine) Leva Rush, MD as Consulting Physician (Obstetrics and Gynecology) Avram Lupita BRAVO, MD as Consulting Physician (Gastroenterology) Tricia, Tawni CROME, MD as Referring Physician (Dermatology) Marcey Elspeth PARAS, MD as Consulting Physician (Ophthalmology)  I have personally reviewed and noted the following in the patients chart:   Medical and social history Use of alcohol, tobacco or illicit drugs  Current medications and supplements including opioid prescriptions. Functional ability and status Nutritional status Physical activity Advanced directives List of other physicians Hospitalizations, surgeries, and ER visits in previous 12 months Vitals Screenings to include cognitive, depression, and falls Referrals and appointments  No orders of the defined types were placed in this encounter.  In addition, I have reviewed and discussed with patient certain preventive protocols, quality metrics, and best practice recommendations. A written personalized care plan for preventive services as well as general preventive health recommendations were provided to patient.   Ardella BRAVO Dawn, LPN   [id number]   Return in 1 year (on 04/21/2025).  After Visit Summary: (MyChart) Due to this being a  telephonic visit, the after visit summary with patients personalized plan was offered to patient via MyChart   Nurse Notes: No voiced or noted concerns at this time

## 2024-04-21 NOTE — Patient Instructions (Addendum)
 Ms. Swetz,  Thank you for taking the time for your Medicare Wellness Visit. I appreciate your continued commitment to your health goals. Please review the care plan we discussed, and feel free to reach out if I can assist you further.  Please note that Annual Wellness Visits do not include a physical exam. Some assessments may be limited, especially if the visit was conducted virtually. If needed, we may recommend an in-person follow-up with your provider.  Ongoing Care Seeing your primary care provider every 3 to 6 months helps us  monitor your health and provide consistent, personalized care.   Referrals If a referral was made during today's visit and you haven't received any updates within two weeks, please contact the referred provider directly to check on the status.  Recommended Screenings:  Health Maintenance  Topic Date Due   Medicare Annual Wellness Visit  07/12/2022   Colon Cancer Screening  10/06/2024   Breast Cancer Screening  07/07/2025   DTaP/Tdap/Td vaccine (4 - Td or Tdap) 05/12/2027   Pneumococcal Vaccine for age over 68  Completed   Flu Shot  Completed   Osteoporosis screening with Bone Density Scan  Completed   Hepatitis C Screening  Completed   Zoster (Shingles) Vaccine  Completed   Meningitis B Vaccine  Aged Out   Hepatitis B Vaccine  Discontinued   COVID-19 Vaccine  Discontinued       04/17/2024    1:36 PM  Advanced Directives  Does Patient Have a Medical Advance Directive? Yes  Type of Estate Agent of [place];Living will  Does patient want to make changes to medical advance directive? No - Patient declined  Copy of Healthcare Power of Attorney in Chart? Yes - validated most recent copy scanned in chart (See row information)    Vision: Annual vision screenings are recommended for early detection of glaucoma, cataracts, and diabetic retinopathy. These exams can also reveal signs of chronic conditions such as diabetes and high blood  pressure.  Dental: Annual dental screenings help detect early signs of oral cancer, gum disease, and other conditions linked to overall health, including heart disease and diabetes.  Please see the attached documents for additional preventive care recommendations.

## 2024-06-13 ENCOUNTER — Other Ambulatory Visit

## 2024-06-13 ENCOUNTER — Other Ambulatory Visit: Payer: Self-pay | Admitting: Medical

## 2024-06-13 DIAGNOSIS — M2550 Pain in unspecified joint: Secondary | ICD-10-CM

## 2024-06-14 ENCOUNTER — Ambulatory Visit: Payer: Self-pay | Admitting: Medical

## 2024-06-14 LAB — SEDIMENTATION RATE: Sed Rate: 2 mm/h (ref 0–40)

## 2024-06-14 LAB — C-REACTIVE PROTEIN: CRP: 1 mg/L (ref 0–10)

## 2024-06-14 NOTE — Progress Notes (Signed)
 Results through MyChart

## 2024-07-25 ENCOUNTER — Ambulatory Visit: Payer: Self-pay | Admitting: Medical

## 2025-04-24 ENCOUNTER — Ambulatory Visit
